# Patient Record
Sex: Male | Born: 1945
Health system: Southern US, Community
[De-identification: ages and names within clinical notes are randomized; demographics above are authoritative.]

## PROBLEM LIST (undated history)

## (undated) DIAGNOSIS — D649 Anemia, unspecified: Secondary | ICD-10-CM

## (undated) DIAGNOSIS — Z87898 Personal history of other specified conditions: Secondary | ICD-10-CM

## (undated) DIAGNOSIS — H9190 Unspecified hearing loss, unspecified ear: Secondary | ICD-10-CM

## (undated) DIAGNOSIS — I499 Cardiac arrhythmia, unspecified: Secondary | ICD-10-CM

## (undated) DIAGNOSIS — E039 Hypothyroidism, unspecified: Secondary | ICD-10-CM

## (undated) DIAGNOSIS — Z87438 Personal history of other diseases of male genital organs: Secondary | ICD-10-CM

## (undated) DIAGNOSIS — R42 Dizziness and giddiness: Secondary | ICD-10-CM

## (undated) DIAGNOSIS — R001 Bradycardia, unspecified: Secondary | ICD-10-CM

## (undated) DIAGNOSIS — C61 Malignant neoplasm of prostate: Secondary | ICD-10-CM

## (undated) DIAGNOSIS — E7800 Pure hypercholesterolemia, unspecified: Secondary | ICD-10-CM

## (undated) DIAGNOSIS — R569 Unspecified convulsions: Secondary | ICD-10-CM

## (undated) DIAGNOSIS — R1310 Dysphagia, unspecified: Secondary | ICD-10-CM

## (undated) DIAGNOSIS — I2510 Atherosclerotic heart disease of native coronary artery without angina pectoris: Secondary | ICD-10-CM

## (undated) DIAGNOSIS — I219 Acute myocardial infarction, unspecified: Secondary | ICD-10-CM

## (undated) DIAGNOSIS — C801 Malignant (primary) neoplasm, unspecified: Secondary | ICD-10-CM

## (undated) DIAGNOSIS — Z974 Presence of external hearing-aid: Secondary | ICD-10-CM

## (undated) DIAGNOSIS — M436 Torticollis: Secondary | ICD-10-CM

## (undated) DIAGNOSIS — I10 Essential (primary) hypertension: Secondary | ICD-10-CM

## (undated) HISTORY — PX: COLONOSCOPY: SHX174

## (undated) HISTORY — PX: CARDIAC CATHETERIZATION: SHX172

## (undated) HISTORY — PX: FRACTURE SURGERY: SHX138

## (undated) HISTORY — PX: THROAT SURGERY: SHX803

## (undated) HISTORY — PX: TONSILLECTOMY: SUR1361

---

## 1994-01-24 HISTORY — PX: CORONARY ARTERY BYPASS GRAFT: SHX141

## 2003-01-25 DIAGNOSIS — C801 Malignant (primary) neoplasm, unspecified: Secondary | ICD-10-CM

## 2003-01-25 HISTORY — DX: Malignant (primary) neoplasm, unspecified: C80.1

## 2004-08-26 ENCOUNTER — Emergency Department: Payer: Self-pay | Admitting: Unknown Physician Specialty

## 2005-06-14 ENCOUNTER — Ambulatory Visit: Payer: Self-pay | Admitting: Unknown Physician Specialty

## 2005-08-25 ENCOUNTER — Encounter: Payer: Self-pay | Admitting: Unknown Physician Specialty

## 2005-09-24 ENCOUNTER — Encounter: Payer: Self-pay | Admitting: Unknown Physician Specialty

## 2007-01-09 ENCOUNTER — Ambulatory Visit: Payer: Self-pay | Admitting: Gastroenterology

## 2007-02-13 ENCOUNTER — Other Ambulatory Visit: Payer: Self-pay

## 2007-02-13 ENCOUNTER — Emergency Department: Payer: Self-pay | Admitting: Emergency Medicine

## 2011-02-09 ENCOUNTER — Ambulatory Visit: Payer: Self-pay | Admitting: Cardiology

## 2011-05-04 ENCOUNTER — Ambulatory Visit: Payer: Self-pay | Admitting: Otolaryngology

## 2012-01-09 DIAGNOSIS — E039 Hypothyroidism, unspecified: Secondary | ICD-10-CM | POA: Insufficient documentation

## 2012-01-12 ENCOUNTER — Ambulatory Visit: Payer: Self-pay | Admitting: Unknown Physician Specialty

## 2012-01-13 LAB — PATHOLOGY REPORT

## 2012-10-29 ENCOUNTER — Ambulatory Visit: Payer: Self-pay | Admitting: Physician Assistant

## 2012-11-14 DIAGNOSIS — R351 Nocturia: Secondary | ICD-10-CM | POA: Insufficient documentation

## 2012-11-14 DIAGNOSIS — Z9221 Personal history of antineoplastic chemotherapy: Secondary | ICD-10-CM | POA: Insufficient documentation

## 2012-11-14 DIAGNOSIS — E782 Mixed hyperlipidemia: Secondary | ICD-10-CM | POA: Insufficient documentation

## 2012-11-14 DIAGNOSIS — R350 Frequency of micturition: Secondary | ICD-10-CM | POA: Insufficient documentation

## 2012-11-14 DIAGNOSIS — C140 Malignant neoplasm of pharynx, unspecified: Secondary | ICD-10-CM | POA: Insufficient documentation

## 2012-11-14 DIAGNOSIS — E291 Testicular hypofunction: Secondary | ICD-10-CM | POA: Insufficient documentation

## 2013-01-24 HISTORY — PX: EYE SURGERY: SHX253

## 2013-08-05 DIAGNOSIS — I10 Essential (primary) hypertension: Secondary | ICD-10-CM | POA: Insufficient documentation

## 2013-08-05 DIAGNOSIS — I2510 Atherosclerotic heart disease of native coronary artery without angina pectoris: Secondary | ICD-10-CM | POA: Insufficient documentation

## 2013-08-05 DIAGNOSIS — H9190 Unspecified hearing loss, unspecified ear: Secondary | ICD-10-CM | POA: Insufficient documentation

## 2013-08-05 DIAGNOSIS — I498 Other specified cardiac arrhythmias: Secondary | ICD-10-CM | POA: Insufficient documentation

## 2014-07-22 ENCOUNTER — Encounter: Payer: Self-pay | Admitting: *Deleted

## 2014-07-29 NOTE — Discharge Instructions (Signed)

## 2014-07-30 ENCOUNTER — Ambulatory Visit
Admission: RE | Admit: 2014-07-30 | Discharge: 2014-07-30 | Disposition: A | Discharge status: Home / Self Care | Payer: Medicare Other | Source: Ambulatory Visit | Attending: Ophthalmology | Admitting: Ophthalmology

## 2014-07-30 ENCOUNTER — Ambulatory Visit: Payer: Medicare Other | Admitting: Anesthesiology

## 2014-07-30 ENCOUNTER — Encounter: Payer: Self-pay | Admitting: *Deleted

## 2014-07-30 ENCOUNTER — Encounter
Admission: RE | Disposition: A | Discharge status: Home / Self Care | Payer: Self-pay | Source: Ambulatory Visit | Attending: Ophthalmology

## 2014-07-30 ENCOUNTER — Ambulatory Visit: Admit: 2014-07-30 | Payer: Self-pay | Admitting: Ophthalmology

## 2014-07-30 DIAGNOSIS — Z951 Presence of aortocoronary bypass graft: Secondary | ICD-10-CM | POA: Diagnosis not present

## 2014-07-30 DIAGNOSIS — H2512 Age-related nuclear cataract, left eye: Secondary | ICD-10-CM | POA: Insufficient documentation

## 2014-07-30 DIAGNOSIS — H9190 Unspecified hearing loss, unspecified ear: Secondary | ICD-10-CM | POA: Diagnosis not present

## 2014-07-30 DIAGNOSIS — Z79899 Other long term (current) drug therapy: Secondary | ICD-10-CM | POA: Insufficient documentation

## 2014-07-30 DIAGNOSIS — E780 Pure hypercholesterolemia: Secondary | ICD-10-CM | POA: Diagnosis not present

## 2014-07-30 DIAGNOSIS — I10 Essential (primary) hypertension: Secondary | ICD-10-CM | POA: Insufficient documentation

## 2014-07-30 DIAGNOSIS — Z85819 Personal history of malignant neoplasm of unspecified site of lip, oral cavity, and pharynx: Secondary | ICD-10-CM | POA: Insufficient documentation

## 2014-07-30 DIAGNOSIS — R011 Cardiac murmur, unspecified: Secondary | ICD-10-CM | POA: Diagnosis not present

## 2014-07-30 DIAGNOSIS — I252 Old myocardial infarction: Secondary | ICD-10-CM | POA: Diagnosis not present

## 2014-07-30 HISTORY — DX: Presence of external hearing-aid: Z97.4

## 2014-07-30 HISTORY — DX: Personal history of other specified conditions: Z87.898

## 2014-07-30 HISTORY — DX: Hypothyroidism, unspecified: E03.9

## 2014-07-30 HISTORY — DX: Dizziness and giddiness: R42

## 2014-07-30 HISTORY — PX: CATARACT EXTRACTION W/PHACO: SHX586

## 2014-07-30 HISTORY — DX: Torticollis: M43.6

## 2014-07-30 HISTORY — DX: Essential (primary) hypertension: I10

## 2014-07-30 HISTORY — DX: Pure hypercholesterolemia, unspecified: E78.00

## 2014-07-30 HISTORY — DX: Acute myocardial infarction, unspecified: I21.9

## 2014-07-30 HISTORY — DX: Malignant (primary) neoplasm, unspecified: C80.1

## 2014-07-30 HISTORY — DX: Unspecified hearing loss, unspecified ear: H91.90

## 2014-07-30 SURGERY — PHACOEMULSIFICATION, CATARACT, WITH IOL INSERTION
Anesthesia: Monitor Anesthesia Care | Laterality: Left | Wound class: Clean

## 2014-07-30 SURGERY — PHACOEMULSIFICATION, CATARACT, WITH IOL INSERTION
Anesthesia: Choice | Laterality: Left

## 2014-07-30 MED ORDER — ARMC OPHTHALMIC DILATING GEL
1.0000 "application " | OPHTHALMIC | Status: DC | PRN
  Administered 2014-07-30 (×2): 1 via OPHTHALMIC

## 2014-07-30 MED ORDER — LIDOCAINE HCL (PF) 4 % IJ SOLN
INTRAOCULAR | Status: DC | PRN
  Administered 2014-07-30: 11:00:00 via OPHTHALMIC

## 2014-07-30 MED ORDER — BRIMONIDINE TARTRATE 0.2 % OP SOLN
OPHTHALMIC | Status: DC | PRN
  Administered 2014-07-30: 1 [drp] via OPHTHALMIC

## 2014-07-30 MED ORDER — NA HYALUR & NA CHOND-NA HYALUR 0.4-0.35 ML IO KIT
PACK | INTRAOCULAR | Status: DC | PRN
  Administered 2014-07-30: 1 mL via INTRAOCULAR

## 2014-07-30 MED ORDER — MIDAZOLAM HCL 2 MG/2ML IJ SOLN
INTRAMUSCULAR | Status: DC | PRN
  Administered 2014-07-30: 2 mg via INTRAVENOUS

## 2014-07-30 MED ORDER — TETRACAINE HCL 0.5 % OP SOLN
1.0000 [drp] | OPHTHALMIC | Status: DC | PRN
  Administered 2014-07-30: 1 [drp] via OPHTHALMIC

## 2014-07-30 MED ORDER — TIMOLOL MALEATE 0.5 % OP SOLN
OPHTHALMIC | Status: DC | PRN
Start: 2014-07-30 — End: 2014-07-30
  Administered 2014-07-30: 1 [drp] via OPHTHALMIC

## 2014-07-30 MED ORDER — EPINEPHRINE HCL 1 MG/ML IJ SOLN
INTRAOCULAR | Status: DC | PRN
  Administered 2014-07-30: 78 mL via OPHTHALMIC

## 2014-07-30 MED ORDER — POVIDONE-IODINE 5 % OP SOLN
1.0000 "application " | OPHTHALMIC | Status: DC | PRN
  Administered 2014-07-30: 1 via OPHTHALMIC

## 2014-07-30 MED ORDER — FENTANYL CITRATE (PF) 100 MCG/2ML IJ SOLN
INTRAMUSCULAR | Status: DC | PRN
  Administered 2014-07-30: 100 ug via INTRAVENOUS

## 2014-07-30 MED ORDER — CEFUROXIME OPHTHALMIC INJECTION 1 MG/0.1 ML
INJECTION | OPHTHALMIC | Status: DC | PRN
  Administered 2014-07-30: 0.1 mL via INTRACAMERAL

## 2014-07-30 SURGICAL SUPPLY — 26 items
CANNULA ANT/CHMB 27GA (MISCELLANEOUS) ×3 IMPLANT
GLOVE SURG LX 7.5 STRW (GLOVE) ×2
GLOVE SURG LX STRL 7.5 STRW (GLOVE) ×1 IMPLANT
GLOVE SURG TRIUMPH 8.0 PF LTX (GLOVE) ×3 IMPLANT
GOWN STRL REUS W/ TWL LRG LVL3 (GOWN DISPOSABLE) ×2 IMPLANT
GOWN STRL REUS W/TWL LRG LVL3 (GOWN DISPOSABLE) ×4
LENS IOL ACRSF IQ PC 23.5 (Intraocular Lens) ×1 IMPLANT
LENS IOL ACRYSOF IQ POST 23.5 (Intraocular Lens) ×3 IMPLANT
MARKER SKIN SURG W/RULER VIO (MISCELLANEOUS) ×3 IMPLANT
NDL RETROBULBAR .5 NSTRL (NEEDLE) IMPLANT
NEEDLE FILTER BLUNT 18X 1/2SAF (NEEDLE) ×2
NEEDLE FILTER BLUNT 18X1 1/2 (NEEDLE) ×1 IMPLANT
PACK CATARACT BRASINGTON (MISCELLANEOUS) ×3 IMPLANT
PACK EYE AFTER SURG (MISCELLANEOUS) ×3 IMPLANT
PACK OPTHALMIC (MISCELLANEOUS) ×3 IMPLANT
RING MALYGIN 7.0 (MISCELLANEOUS) IMPLANT
SUT ETHILON 10-0 CS-B-6CS-B-6 (SUTURE)
SUT VICRYL  9 0 (SUTURE)
SUT VICRYL 9 0 (SUTURE) IMPLANT
SUTURE EHLN 10-0 CS-B-6CS-B-6 (SUTURE) IMPLANT
SYR 3ML LL SCALE MARK (SYRINGE) ×3 IMPLANT
SYR 5ML LL (SYRINGE) IMPLANT
SYR TB 1ML LUER SLIP (SYRINGE) ×3 IMPLANT
WATER STERILE IRR 250ML POUR (IV SOLUTION) ×3 IMPLANT
WATER STERILE IRR 500ML POUR (IV SOLUTION) IMPLANT
WIPE NON LINTING 3.25X3.25 (MISCELLANEOUS) ×3 IMPLANT

## 2014-07-30 NOTE — H&P (Signed)
  The History and Physical notes were scanned in.  The patient remains stable and unchanged from the H&P.   Previous H&P reviewed, patient examined, and there are no changes.  Jermaine Campbell 07/30/2014 10:59 AM

## 2014-07-30 NOTE — Anesthesia Postprocedure Evaluation (Signed)
  Anesthesia Post-op Note  Patient: Jermaine Campbell  Procedure(s) Performed: Procedure(s) with comments: CATARACT EXTRACTION PHACO AND INTRAOCULAR LENS PLACEMENT ([place]) SHUGAR[place]INE (Left) - SHUGARCAINE  Anesthesia type:MAC  Patient location: PACU  Post pain: Pain level controlled  Post assessment: Post-op Vital signs reviewed, Patient's Cardiovascular Status Stable, Respiratory Function Stable, Patent Airway and No signs of Nausea or vomiting  Post vital signs: Reviewed and stable  Last Vitals:  Filed Vitals:   07/30/14 1142  BP: 159/79  Pulse: 52  Temp: 36.6 C  Resp:     Level of consciousness: awake, alert  and patient cooperative  Complications: No apparent anesthesia complications

## 2014-07-30 NOTE — Transfer of Care (Signed)
Immediate Anesthesia Transfer of Care Note  Patient: Jermaine Campbell  Procedure(s) Performed: Procedure(s) with comments: CATARACT EXTRACTION PHACO AND INTRAOCULAR LENS PLACEMENT ([place]) [place] (Left) - SHUGARCAINE  Patient Location: PACU  Anesthesia Type: MAC  Level of Consciousness: awake, alert  and patient cooperative  Airway and Oxygen Therapy: Patient Spontanous Breathing and Patient connected to supplemental oxygen  Post-op Assessment: Post-op Vital signs reviewed, Patient's Cardiovascular Status Stable, Respiratory Function Stable, Patent Airway and No signs of Nausea or vomiting  Post-op Vital Signs: Reviewed and stable  Complications: No apparent anesthesia complications

## 2014-07-30 NOTE — Op Note (Signed)
LOCATION:  [place]  PREOPERATIVE DIAGNOSIS:  Nuclear sclerotic cataract left eye.  H25.12  POSTOPERATIVE DIAGNOSIS:  Nuclear sclerotic cataract left eye.   PROCEDURE:  Phacoemulsification with posterior chamber intraocular lens implantation of the left eye.   LENS:  Implant Name Type Inv. Item Serial No. Manufacturer Lot No. LRB No. Used  IMPLANT LENS - [id number] Intraocular Lens IMPLANT LENS   ALCON   Left 1  [id number] 20.0 D PCIOL   ULTRASOUND TIME:  18%  of 1 minutes 30 seconds, CDE 16.4 SURGEON:  Wyonia Hough, MD   ANESTHESIA:  Retrobulbar block of Xylocaine and Bupivacaine.   COMPLICATIONS:  None.   DESCRIPTION OF PROCEDURE:  The patient was identified in the holding room and transported to the operating room and placed in the supine position under the operating microscope.  The left eye was identified as the operative eye and a retrobulbar block was performed under intravenous sedation.  It was then prepped and draped in the usual sterile ophthalmic fashion.   A 1 millimeter clear-corneal paracentesis was made at the 1:30 position.  The anterior chamber was filled with Viscoat viscoelastic.  A 2.4 millimeter keratome was used to make a near-clear corneal incision at the 10:30 position.  A curvilinear capsulorrhexis was made with a cystotome and capsulorrhexis forceps.  Balanced salt solution was used to hydrodissect and hydrodelineate the nucleus.   Phacoemulsification was then used in stop and chop fashion to remove the lens nucleus and epinucleus.  The remaining cortex was then removed using the irrigation and aspiration handpiece.  Provisc was then placed into the capsular bag to distend it for lens placement.  A 20.0 -diopter lens was then injected into the capsular bag.  The remaining viscoelastic was aspirated.   Wounds were hydrated with balanced salt solution.  The anterior chamber was inflated to a physiologic pressure with balanced salt solution.  No  wound leaks were noted. Cefuroxime 0.1 ml of a 10mg /ml solution was injected into the anterior chamber for a dose of 1 mg of intracameral antibiotic at the completion of the case.   Timolol and Brimonidine drops were placed followed by erythromycin ointment.  The eye was patched.  The patient was taken to the recovery room in stable condition without complications of anesthesia or surgery.   Jermaine Campbell 07/30/2014, 11:40 AM

## 2014-07-30 NOTE — Anesthesia Procedure Notes (Signed)
Procedure Name: MAC Performed by: Corbet Hanley Pre-anesthesia Checklist: Patient identified, Emergency Drugs available, Suction available, Timeout performed and Patient being monitored Patient Re-evaluated:Patient Re-evaluated prior to inductionOxygen Delivery Method: Nasal cannula Placement Confirmation: positive ETCO2     

## 2014-07-30 NOTE — Anesthesia Preprocedure Evaluation (Signed)
Anesthesia Evaluation  Patient identified by MRN, date of birth, ID band  Reviewed: Allergy & Precautions, H&P , NPO status , Patient's Chart, lab work & pertinent test results  Airway Mallampati: II  TM Distance: >3 FB Neck ROM: limited    Dental no notable dental hx.    Pulmonary    Pulmonary exam normal       Cardiovascular hypertension, + Past MI Rhythm:regular Rate:Normal     Neuro/Psych    GI/Hepatic   Endo/Other  Hypothyroidism   Renal/GU      Musculoskeletal   Abdominal   Peds  Hematology   Anesthesia Other Findings H/o throat cancer, s/p radiation.  Neck is tight.  Probable difficult intubation.  Reproductive/Obstetrics                             Anesthesia Physical Anesthesia Plan  ASA: II  Anesthesia Plan: MAC   Post-op Pain Management:    Induction:   Airway Management Planned:   Additional Equipment:   Intra-op Plan:   Post-operative Plan:   Informed Consent: I have reviewed the patients History and Physical, chart, labs and discussed the procedure including the risks, benefits and alternatives for the proposed anesthesia with the patient or authorized representative who has indicated his/her understanding and acceptance.     Plan Discussed with: CRNA  Anesthesia Plan Comments:         Anesthesia Quick Evaluation

## 2014-07-31 ENCOUNTER — Encounter: Payer: Self-pay | Admitting: Ophthalmology

## 2014-10-23 ENCOUNTER — Encounter: Payer: Self-pay | Admitting: Anesthesiology

## 2014-10-29 ENCOUNTER — Ambulatory Visit
Admission: RE | Admit: 2014-10-29 | Discharge: 2014-10-29 | Disposition: A | Discharge status: Home / Self Care | Payer: Medicare Other | Source: Ambulatory Visit | Attending: Ophthalmology | Admitting: Ophthalmology

## 2014-10-29 ENCOUNTER — Encounter
Admission: RE | Disposition: A | Discharge status: Home / Self Care | Payer: Self-pay | Source: Ambulatory Visit | Attending: Ophthalmology

## 2014-10-29 ENCOUNTER — Ambulatory Visit: Payer: Medicare Other | Admitting: Anesthesiology

## 2014-10-29 DIAGNOSIS — H9190 Unspecified hearing loss, unspecified ear: Secondary | ICD-10-CM | POA: Diagnosis not present

## 2014-10-29 DIAGNOSIS — Z951 Presence of aortocoronary bypass graft: Secondary | ICD-10-CM | POA: Insufficient documentation

## 2014-10-29 DIAGNOSIS — E7800 Pure hypercholesterolemia, unspecified: Secondary | ICD-10-CM | POA: Diagnosis not present

## 2014-10-29 DIAGNOSIS — Z85819 Personal history of malignant neoplasm of unspecified site of lip, oral cavity, and pharynx: Secondary | ICD-10-CM | POA: Diagnosis not present

## 2014-10-29 DIAGNOSIS — H2511 Age-related nuclear cataract, right eye: Secondary | ICD-10-CM | POA: Insufficient documentation

## 2014-10-29 DIAGNOSIS — I252 Old myocardial infarction: Secondary | ICD-10-CM | POA: Diagnosis not present

## 2014-10-29 DIAGNOSIS — Z7982 Long term (current) use of aspirin: Secondary | ICD-10-CM | POA: Diagnosis not present

## 2014-10-29 DIAGNOSIS — I10 Essential (primary) hypertension: Secondary | ICD-10-CM | POA: Diagnosis not present

## 2014-10-29 DIAGNOSIS — Z9842 Cataract extraction status, left eye: Secondary | ICD-10-CM | POA: Insufficient documentation

## 2014-10-29 DIAGNOSIS — Z79899 Other long term (current) drug therapy: Secondary | ICD-10-CM | POA: Diagnosis not present

## 2014-10-29 DIAGNOSIS — R011 Cardiac murmur, unspecified: Secondary | ICD-10-CM | POA: Diagnosis not present

## 2014-10-29 HISTORY — PX: CATARACT EXTRACTION W/PHACO: SHX586

## 2014-10-29 SURGERY — PHACOEMULSIFICATION, CATARACT, WITH IOL INSERTION
Anesthesia: Monitor Anesthesia Care | Laterality: Right | Wound class: Clean

## 2014-10-29 MED ORDER — NA HYALUR & NA CHOND-NA HYALUR 0.4-0.35 ML IO KIT
PACK | INTRAOCULAR | Status: DC | PRN
  Administered 2014-10-29: 1 mL via INTRAOCULAR

## 2014-10-29 MED ORDER — CEFUROXIME OPHTHALMIC INJECTION 1 MG/0.1 ML
INJECTION | OPHTHALMIC | Status: DC | PRN
  Administered 2014-10-29: 0.1 mL via INTRACAMERAL

## 2014-10-29 MED ORDER — FENTANYL CITRATE (PF) 100 MCG/2ML IJ SOLN
INTRAMUSCULAR | Status: DC | PRN
  Administered 2014-10-29: 50 ug via INTRAVENOUS

## 2014-10-29 MED ORDER — BRIMONIDINE TARTRATE 0.2 % OP SOLN
OPHTHALMIC | Status: DC | PRN
  Administered 2014-10-29: 1 [drp] via OPHTHALMIC

## 2014-10-29 MED ORDER — BALANCED SALT IO SOLN
INTRAOCULAR | Status: DC | PRN
  Administered 2014-10-29: 1 mL via OPHTHALMIC

## 2014-10-29 MED ORDER — MIDAZOLAM HCL 2 MG/2ML IJ SOLN
INTRAMUSCULAR | Status: DC | PRN
  Administered 2014-10-29: 2 mg via INTRAVENOUS

## 2014-10-29 MED ORDER — EPINEPHRINE HCL 1 MG/ML IJ SOLN
INTRAOCULAR | Status: DC | PRN
  Administered 2014-10-29: 80 mL via OPHTHALMIC

## 2014-10-29 MED ORDER — ARMC OPHTHALMIC DILATING GEL
1.0000 "application " | OPHTHALMIC | Status: DC | PRN
  Administered 2014-10-29 (×2): 1 via OPHTHALMIC

## 2014-10-29 MED ORDER — POVIDONE-IODINE 5 % OP SOLN
1.0000 "application " | OPHTHALMIC | Status: DC | PRN
  Administered 2014-10-29: 1 via OPHTHALMIC

## 2014-10-29 MED ORDER — TIMOLOL MALEATE 0.5 % OP SOLN
OPHTHALMIC | Status: DC | PRN
  Administered 2014-10-29: 1 [drp] via OPHTHALMIC

## 2014-10-29 MED ORDER — TETRACAINE HCL 0.5 % OP SOLN
1.0000 [drp] | OPHTHALMIC | Status: DC | PRN
  Administered 2014-10-29: 1 [drp] via OPHTHALMIC

## 2014-10-29 MED ORDER — LACTATED RINGERS IV SOLN: INTRAVENOUS | Status: DC

## 2014-10-29 SURGICAL SUPPLY — 26 items
CANNULA ANT/CHMB 27GA (MISCELLANEOUS) ×3 IMPLANT
GLOVE SURG LX 7.5 STRW (GLOVE) ×2
GLOVE SURG LX STRL 7.5 STRW (GLOVE) ×1 IMPLANT
GLOVE SURG TRIUMPH 8.0 PF LTX (GLOVE) ×3 IMPLANT
GOWN STRL REUS W/ TWL LRG LVL3 (GOWN DISPOSABLE) ×2 IMPLANT
GOWN STRL REUS W/TWL LRG LVL3 (GOWN DISPOSABLE) ×4
LENS IOL ACRSF IQ PC 20.5 (Intraocular Lens) ×1 IMPLANT
LENS IOL ACRYSOF IQ POST 20.5 (Intraocular Lens) ×3 IMPLANT
MARKER SKIN SURG W/RULER VIO (MISCELLANEOUS) ×3 IMPLANT
NDL RETROBULBAR .5 NSTRL (NEEDLE) IMPLANT
NEEDLE FILTER BLUNT 18X 1/2SAF (NEEDLE) ×4
NEEDLE FILTER BLUNT 18X1 1/2 (NEEDLE) ×2 IMPLANT
PACK CATARACT BRASINGTON (MISCELLANEOUS) ×3 IMPLANT
PACK EYE AFTER SURG (MISCELLANEOUS) ×3 IMPLANT
PACK OPTHALMIC (MISCELLANEOUS) ×3 IMPLANT
RING MALYGIN 7.0 (MISCELLANEOUS) IMPLANT
SUT ETHILON 10-0 CS-B-6CS-B-6 (SUTURE)
SUT VICRYL  9 0 (SUTURE)
SUT VICRYL 9 0 (SUTURE) IMPLANT
SUTURE EHLN 10-0 CS-B-6CS-B-6 (SUTURE) IMPLANT
SYR 3ML LL SCALE MARK (SYRINGE) ×6 IMPLANT
SYR 5ML LL (SYRINGE) IMPLANT
SYR TB 1ML LUER SLIP (SYRINGE) ×3 IMPLANT
WATER STERILE IRR 250ML POUR (IV SOLUTION) ×3 IMPLANT
WATER STERILE IRR 500ML POUR (IV SOLUTION) IMPLANT
WIPE NON LINTING 3.25X3.25 (MISCELLANEOUS) ×3 IMPLANT

## 2014-10-29 NOTE — Anesthesia Procedure Notes (Signed)
Procedure Name: MAC Performed by: Erie Radu Pre-anesthesia Checklist: Patient identified, Emergency Drugs available, Suction available, Timeout performed and Patient being monitored Patient Re-evaluated:Patient Re-evaluated prior to inductionOxygen Delivery Method: Nasal cannula Placement Confirmation: positive ETCO2     

## 2014-10-29 NOTE — Op Note (Signed)
LOCATION:  [place]   PREOPERATIVE DIAGNOSIS:    Nuclear sclerotic cataract right eye. H25.11   POSTOPERATIVE DIAGNOSIS:  Nuclear sclerotic cataract right eye.     PROCEDURE:  Phacoemusification with posterior chamber intraocular lens placement of the right eye   LENS:   Implant Name Type Inv. Item Serial No. Manufacturer Lot No. LRB No. Used  IMPLANT LENS - [id number] Intraocular Lens IMPLANT LENS [id number] ALCON   Right 1     SN60WF 20.5 D   ULTRASOUND TIME: 10 % of 1 minutes, 54 seconds.  CDE 11.9   SURGEON:  Wyonia Hough, MD   ANESTHESIA:  Topical with tetracaine drops and 2% Xylocaine jelly, augmented with 1% preservative-free intracameral lidocaine.    COMPLICATIONS:  None.   DESCRIPTION OF PROCEDURE:  The patient was identified in the holding room and transported to the operating room and placed in the supine position under the operating microscope.  The right eye was identified as the operative eye and it was prepped and draped in the usual sterile ophthalmic fashion.   A 1 millimeter clear-corneal paracentesis was made at the 12:00 position.  0.5 ml of preservative-free 1% lidocaine was injected into the anterior chamber. The anterior chamber was filled with Viscoat viscoelastic.  A 2.4 millimeter keratome was used to make a near-clear corneal incision at the 9:00 position.  A curvilinear capsulorrhexis was made with a cystotome and capsulorrhexis forceps.  Balanced salt solution was used to hydrodissect and hydrodelineate the nucleus.   Phacoemulsification was then used in stop and chop fashion to remove the lens nucleus and epinucleus.  The remaining cortex was then removed using the irrigation and aspiration handpiece. Provisc was then placed into the capsular bag to distend it for lens placement.  A lens was then injected into the capsular bag.  The remaining viscoelastic was aspirated.   Wounds were hydrated with balanced salt solution.  The  anterior chamber was inflated to a physiologic pressure with balanced salt solution.  No wound leaks were noted. Cefuroxime 0.1 ml of a 10mg /ml solution was injected into the anterior chamber for a dose of 1 mg of intracameral antibiotic at the completion of the case.   Timolol and Brimonidine drops were applied to the eye.  The patient was taken to the recovery room in stable condition without complications of anesthesia or surgery.   Jermaine Campbell 10/29/2014, 10:29 AM

## 2014-10-29 NOTE — Anesthesia Postprocedure Evaluation (Signed)
  Anesthesia Post-op Note  Patient: Jermaine Campbell  Procedure(s) Performed: Procedure(s) with comments: CATARACT EXTRACTION PHACO AND INTRAOCULAR LENS PLACEMENT ([place]) (Right) - SHUGARCAINE  Anesthesia type:MAC  Patient location: PACU  Post pain: Pain level controlled  Post assessment: Post-op Vital signs reviewed, Patient's Cardiovascular Status Stable, Respiratory Function Stable, Patent Airway and No signs of Nausea or vomiting  Post vital signs: Reviewed and stable  Last Vitals:  Filed Vitals:   10/29/14 1045  BP: 121/73  Pulse: 53  Temp:   Resp: 20    Level of consciousness: awake, alert  and patient cooperative  Complications: No apparent anesthesia complications

## 2014-10-29 NOTE — Transfer of Care (Signed)
Immediate Anesthesia Transfer of Care Note  Patient: Jermaine Campbell  Procedure(s) Performed: Procedure(s) with comments: CATARACT EXTRACTION PHACO AND INTRAOCULAR LENS PLACEMENT ([place]) (Right) - [place]  Patient Location: PACU  Anesthesia Type: MAC  Level of Consciousness: awake, alert  and patient cooperative  Airway and Oxygen Therapy: Patient Spontanous Breathing and Patient connected to supplemental oxygen  Post-op Assessment: Post-op Vital signs reviewed, Patient's Cardiovascular Status Stable, Respiratory Function Stable, Patent Airway and No signs of Nausea or vomiting  Post-op Vital Signs: Reviewed and stable  Complications: No apparent anesthesia complications

## 2014-10-29 NOTE — Discharge Instructions (Signed)
Cataract Surgery, Care After Refer to this sheet in the next few weeks. These instructions provide you with information on caring for yourself after your procedure. Your caregiver may also give you more specific instructions. Your treatment has been planned according to current medical practices, but problems sometimes occur. Call your caregiver if you have any problems or questions after your procedure.  HOME CARE INSTRUCTIONS   Avoid strenuous activities as directed by your caregiver.  Ask your caregiver when you can resume driving.  Use eyedrops or other medicines to help healing and control pressure inside your eye as directed by your caregiver.  Only take over-the-counter or prescription medicines for pain, discomfort, or fever as directed by your caregiver.  Do not to touch or rub your eyes.  You may be instructed to use a protective shield during the first few days and nights after surgery. If not, wear sunglasses to protect your eyes. This is to protect the eye from pressure or from being accidentally bumped.  Keep the area around your eye clean and dry. Avoid swimming or allowing water to hit you directly in the face while showering. Keep soap and shampoo out of your eyes.  Do not bend or lift heavy objects. Bending increases pressure in the eye. You can walk, climb stairs, and do light household chores.  Do not put a contact lens into the eye that had surgery until your caregiver says it is okay to do so.  Ask your doctor when you can return to work. This will depend on the kind of work that you do. If you work in a dusty environment, you may be advised to wear protective eyewear for a period of time.  Ask your caregiver when it will be safe to engage in sexual activity.  Continue with your regular eye exams as directed by your caregiver. What to expect:  It is normal to feel itching and mild discomfort for a few days after cataract surgery. Some fluid discharge is also common,  and your eye may be sensitive to light and touch.  After 1 to 2 days, even moderate discomfort should disappear. In most cases, healing will take about 6 weeks.  If you received an intraocular lens (IOL), you may notice that colors are very bright or have a blue tinge. Also, if you have been in bright sunlight, everything may appear reddish for a few hours. If you see these color tinges, it is because your lens is clear and no longer cloudy. Within a few months after receiving an IOL, these extra colors should go away. When you have healed, you will probably need new glasses. SEEK MEDICAL CARE IF:   You have increased bruising around your eye.  You have discomfort not helped by medicine. SEEK IMMEDIATE MEDICAL CARE IF:   You have a fever.  You have a worsening or sudden vision loss.  You have redness, swelling, or increasing pain in the eye.  You have a thick discharge from the eye that had surgery. MAKE SURE YOU:  Understand these instructions.  Will watch your condition.  Will get help right away if you are not doing well or get worse.   This information is not intended to replace advice given to you by your health care provider. Make sure you discuss any questions you have with your health care provider.   Document Released: 07/30/2004 Document Revised: 01/31/2014 Document Reviewed: 09/03/2010 Elsevier Interactive Patient Education 2016 [place] Anesthesia, Care After Refer to  this sheet in the next few weeks. These instructions provide you with information on caring for yourself after your procedure. Your health care provider may also give you more specific instructions. Your treatment has been planned according to current medical practices, but problems sometimes occur. Call your health care provider if you have any problems or questions after your procedure. WHAT TO EXPECT AFTER THE PROCEDURE After the procedure, it is typical to  experience:  Sleepiness.  Nausea and vomiting. HOME CARE INSTRUCTIONS  For the first 24 hours after general anesthesia:  Have a responsible person with you.  Do not drive a car. If you are alone, do not take public transportation.  Do not drink alcohol.  Do not take medicine that has not been prescribed by your health care provider.  Do not sign important papers or make important decisions.  You may resume a normal diet and activities as directed by your health care provider.  Change bandages (dressings) as directed.  If you have questions or problems that seem related to general anesthesia, call the hospital and ask for the anesthetist or anesthesiologist on call. SEEK MEDICAL CARE IF:  You have nausea and vomiting that continue the day after anesthesia.  You develop a rash. SEEK IMMEDIATE MEDICAL CARE IF:   You have difficulty breathing.  You have chest pain.  You have any allergic problems. Document Released: 04/18/2000 Document Revised: 01/15/2013 Document Reviewed: 07/26/2012 [facility] Patient Information 2015 [place], [place]. This information is not intended to replace advice given to you by your health care provider. Make sure you discuss any questions you have with your health care provider.

## 2014-10-29 NOTE — H&P (Signed)
  The History and Physical notes were scanned in.  The patient remains stable and unchanged from the H&P.   Previous H&P reviewed, patient examined, and there are no changes.  Jermaine Campbell 10/29/2014 9:15 AM

## 2014-10-29 NOTE — Anesthesia Preprocedure Evaluation (Signed)
Anesthesia Evaluation  Patient identified by MRN, date of birth, ID band  Reviewed: Allergy & Precautions, H&P , NPO status , Patient's Chart, lab work & pertinent test results  Airway Mallampati: II  TM Distance: >3 FB Neck ROM: limited    Dental no notable dental hx.    Pulmonary    Pulmonary exam normal       Cardiovascular hypertension, + Past MI Rhythm:regular Rate:Normal     Neuro/Psych    GI/Hepatic   Endo/Other  Hypothyroidism   Renal/GU      Musculoskeletal   Abdominal   Peds  Hematology   Anesthesia Other Findings H/o throat cancer, s/p radiation.  Neck is tight.  Probable difficult intubation.  Reproductive/Obstetrics                             Anesthesia Physical Anesthesia Plan  ASA: II  Anesthesia Plan: MAC   Post-op Pain Management:    Induction:   Airway Management Planned:   Additional Equipment:   Intra-op Plan:   Post-operative Plan:   Informed Consent: I have reviewed the patients History and Physical, chart, labs and discussed the procedure including the risks, benefits and alternatives for the proposed anesthesia with the patient or authorized representative who has indicated his/her understanding and acceptance.     Plan Discussed with: CRNA  Anesthesia Plan Comments:         Anesthesia Quick Evaluation  

## 2014-10-30 ENCOUNTER — Encounter: Payer: Self-pay | Admitting: Ophthalmology

## 2015-12-04 DIAGNOSIS — M47812 Spondylosis without myelopathy or radiculopathy, cervical region: Secondary | ICD-10-CM | POA: Insufficient documentation

## 2015-12-04 DIAGNOSIS — M75121 Complete rotator cuff tear or rupture of right shoulder, not specified as traumatic: Secondary | ICD-10-CM | POA: Insufficient documentation

## 2015-12-04 DIAGNOSIS — M7581 Other shoulder lesions, right shoulder: Secondary | ICD-10-CM | POA: Insufficient documentation

## 2015-12-08 ENCOUNTER — Other Ambulatory Visit: Payer: Self-pay | Admitting: Surgery

## 2015-12-08 DIAGNOSIS — M25511 Pain in right shoulder: Secondary | ICD-10-CM

## 2015-12-21 ENCOUNTER — Ambulatory Visit
Admission: RE | Admit: 2015-12-21 | Discharge: 2015-12-21 | Disposition: A | Discharge status: Home / Self Care | Payer: Medicare Other | Source: Ambulatory Visit | Attending: Surgery | Admitting: Surgery

## 2015-12-21 DIAGNOSIS — M25511 Pain in right shoulder: Secondary | ICD-10-CM | POA: Diagnosis present

## 2015-12-21 DIAGNOSIS — M75101 Unspecified rotator cuff tear or rupture of right shoulder, not specified as traumatic: Secondary | ICD-10-CM | POA: Diagnosis not present

## 2016-01-11 ENCOUNTER — Encounter
Admission: RE | Admit: 2016-01-11 | Discharge: 2016-01-11 | Disposition: A | Discharge status: Home / Self Care | Payer: Medicare Other | Source: Ambulatory Visit | Attending: Surgery | Admitting: Surgery

## 2016-01-11 DIAGNOSIS — Z01812 Encounter for preprocedural laboratory examination: Secondary | ICD-10-CM | POA: Diagnosis not present

## 2016-01-11 DIAGNOSIS — M75121 Complete rotator cuff tear or rupture of right shoulder, not specified as traumatic: Secondary | ICD-10-CM | POA: Insufficient documentation

## 2016-01-11 DIAGNOSIS — Z01818 Encounter for other preprocedural examination: Secondary | ICD-10-CM | POA: Insufficient documentation

## 2016-01-11 HISTORY — DX: Unspecified convulsions: R56.9

## 2016-01-11 HISTORY — DX: Anemia, unspecified: D64.9

## 2016-01-11 LAB — CBC
HEMATOCRIT: 37 % — AB (ref 40.0–52.0)
HEMOGLOBIN: 12.3 g/dL — AB (ref 13.0–18.0)
MCH: 28.6 pg (ref 26.0–34.0)
MCHC: 33.3 g/dL (ref 32.0–36.0)
MCV: 85.8 fL (ref 80.0–100.0)
Platelets: 151 10*3/uL (ref 150–440)
RBC: 4.31 MIL/uL — ABNORMAL LOW (ref 4.40–5.90)
RDW: 16.1 % — AB (ref 11.5–14.5)
WBC: 4.5 10*3/uL (ref 3.8–10.6)

## 2016-01-11 NOTE — Patient Instructions (Signed)
Your procedure is scheduled on: Tuesday 01/26/15 Report to [facility]. 2ND FLOOR MEDICAL MALL ENTRANCE. To find out your arrival time please call [phone] between 1PM - 3PM on Friday 01/22/16.  Remember: Instructions that are not followed completely may result in serious medical risk, up to and including death, or upon the discretion of your surgeon and anesthesiologist your surgery may need to be rescheduled.    __X__ 1. Do not eat food or drink liquids after midnight. No gum chewing or hard candies.     __X__ 2. No Alcohol for 24 hours before or after surgery.   ____ 3. Bring all medications with you on the day of surgery if instructed.    __X__ 4. Notify your doctor if there is any change in your medical condition     (cold, fever, infections).             ___X__5. No smoking within 24 hours of your surgery.     Do not wear jewelry, make-up, hairpins, clips or nail polish.  Do not wear lotions, powders, or perfumes.   Do not shave 48 hours prior to surgery. Men may shave face and neck.  Do not bring valuables to the hospital.    [facility] is not responsible for any belongings or valuables.               Contacts, dentures or bridgework may not be worn into surgery.  Leave your suitcase in the car. After surgery it may be brought to your room.  For patients admitted to the hospital, discharge time is determined by your                treatment team.   Patients discharged the day of surgery will not be allowed to drive home.   Please read over the following fact sheets that you were given:   Pain Booklet and MRSA Information   ____ Take these medicines the morning of surgery with A SIP OF WATER:    1. NONE  2.   3.   4.  5.  6.  ____ Fleet Enema (as directed)   __X__ Use CHG Soap as directed  ____ Use inhalers on the day of surgery  ____ Stop metformin 2 days prior to surgery    ____ Take 1/2 of usual insulin dose the night before surgery and none on the  morning of surgery.   __X__ Stop Coumadin/Plavix/aspirin on 01/16/16  __X__ Stop Anti-inflammatories such as Advil, Aleve, Ibuprofen, Motrin, Naproxen, Naprosyn, Goodies,powder, or aspirin products.  OK to take Tylenol.   __X__ Stop supplements until after surgery. (B12, WHEN YOU STOP ASPIRIN)   ____ Bring C-Pap to the hospital.

## 2016-01-26 ENCOUNTER — Ambulatory Visit
Admission: RE | Admit: 2016-01-26 | Discharge: 2016-01-26 | Disposition: A | Discharge status: Home / Self Care | Payer: Medicare Other | Source: Ambulatory Visit | Attending: Surgery | Admitting: Surgery

## 2016-01-26 ENCOUNTER — Ambulatory Visit: Payer: Medicare Other | Admitting: Anesthesiology

## 2016-01-26 ENCOUNTER — Encounter
Admission: RE | Disposition: A | Discharge status: Home / Self Care | Payer: Self-pay | Source: Ambulatory Visit | Attending: Surgery

## 2016-01-26 ENCOUNTER — Encounter: Payer: Self-pay | Admitting: *Deleted

## 2016-01-26 DIAGNOSIS — M75111 Incomplete rotator cuff tear or rupture of right shoulder, not specified as traumatic: Secondary | ICD-10-CM | POA: Diagnosis not present

## 2016-01-26 DIAGNOSIS — M7521 Bicipital tendinitis, right shoulder: Secondary | ICD-10-CM | POA: Insufficient documentation

## 2016-01-26 DIAGNOSIS — Z9221 Personal history of antineoplastic chemotherapy: Secondary | ICD-10-CM | POA: Diagnosis not present

## 2016-01-26 DIAGNOSIS — D649 Anemia, unspecified: Secondary | ICD-10-CM | POA: Insufficient documentation

## 2016-01-26 DIAGNOSIS — Z7982 Long term (current) use of aspirin: Secondary | ICD-10-CM | POA: Diagnosis not present

## 2016-01-26 DIAGNOSIS — Z923 Personal history of irradiation: Secondary | ICD-10-CM | POA: Insufficient documentation

## 2016-01-26 DIAGNOSIS — Z85819 Personal history of malignant neoplasm of unspecified site of lip, oral cavity, and pharynx: Secondary | ICD-10-CM | POA: Diagnosis not present

## 2016-01-26 DIAGNOSIS — I2510 Atherosclerotic heart disease of native coronary artery without angina pectoris: Secondary | ICD-10-CM | POA: Diagnosis not present

## 2016-01-26 DIAGNOSIS — I10 Essential (primary) hypertension: Secondary | ICD-10-CM | POA: Insufficient documentation

## 2016-01-26 DIAGNOSIS — I25810 Atherosclerosis of coronary artery bypass graft(s) without angina pectoris: Secondary | ICD-10-CM | POA: Insufficient documentation

## 2016-01-26 DIAGNOSIS — M75101 Unspecified rotator cuff tear or rupture of right shoulder, not specified as traumatic: Secondary | ICD-10-CM | POA: Diagnosis not present

## 2016-01-26 DIAGNOSIS — E039 Hypothyroidism, unspecified: Secondary | ICD-10-CM | POA: Insufficient documentation

## 2016-01-26 DIAGNOSIS — M94211 Chondromalacia, right shoulder: Secondary | ICD-10-CM | POA: Diagnosis not present

## 2016-01-26 DIAGNOSIS — Z79899 Other long term (current) drug therapy: Secondary | ICD-10-CM | POA: Diagnosis not present

## 2016-01-26 DIAGNOSIS — I252 Old myocardial infarction: Secondary | ICD-10-CM | POA: Insufficient documentation

## 2016-01-26 DIAGNOSIS — Z955 Presence of coronary angioplasty implant and graft: Secondary | ICD-10-CM | POA: Diagnosis not present

## 2016-01-26 DIAGNOSIS — M75121 Complete rotator cuff tear or rupture of right shoulder, not specified as traumatic: Secondary | ICD-10-CM

## 2016-01-26 HISTORY — PX: BICEPT TENODESIS: SHX5116

## 2016-01-26 HISTORY — PX: OPEN SUBSCAPULARIS REPAIR: SHX6233

## 2016-01-26 HISTORY — PX: SHOULDER ARTHROSCOPY WITH OPEN ROTATOR CUFF REPAIR: SHX6092

## 2016-01-26 HISTORY — PX: SHOULDER ARTHROSCOPY WITH SUBACROMIAL DECOMPRESSION: SHX5684

## 2016-01-26 SURGERY — ARTHROSCOPY, SHOULDER WITH REPAIR, ROTATOR CUFF, OPEN
Anesthesia: Regional | Site: Shoulder | Laterality: Right | Wound class: Clean

## 2016-01-26 MED ORDER — DEXAMETHASONE SODIUM PHOSPHATE 10 MG/ML IJ SOLN
INTRAMUSCULAR | Status: AC
  Filled 2016-01-26: qty 1

## 2016-01-26 MED ORDER — SUCCINYLCHOLINE CHLORIDE 20 MG/ML IJ SOLN
INTRAMUSCULAR | Status: DC | PRN
  Administered 2016-01-26: 100 mg via INTRAVENOUS

## 2016-01-26 MED ORDER — LIDOCAINE HCL (PF) 1 % IJ SOLN
INTRAMUSCULAR | Status: AC
  Filled 2016-01-26: qty 5

## 2016-01-26 MED ORDER — ROPIVACAINE HCL 5 MG/ML IJ SOLN
INTRAMUSCULAR | Status: DC | PRN
  Administered 2016-01-26: 30 mL via PERINEURAL

## 2016-01-26 MED ORDER — EPHEDRINE SULFATE 50 MG/ML IJ SOLN
INTRAMUSCULAR | Status: DC | PRN
  Administered 2016-01-26: 10 mg via INTRAVENOUS

## 2016-01-26 MED ORDER — MIDAZOLAM HCL 2 MG/2ML IJ SOLN
1.0000 mg | Freq: Once | INTRAMUSCULAR | Status: AC
Start: 2016-01-26 — End: 2016-01-26
  Administered 2016-01-26: 1 mg via INTRAVENOUS

## 2016-01-26 MED ORDER — ROCURONIUM BROMIDE 50 MG/5ML IV SOSY
PREFILLED_SYRINGE | INTRAVENOUS | Status: AC
  Filled 2016-01-26: qty 5

## 2016-01-26 MED ORDER — MIDAZOLAM HCL 2 MG/2ML IJ SOLN
INTRAMUSCULAR | Status: AC
Start: 2016-01-26 — End: 2016-01-26
  Administered 2016-01-26: 1 mg via INTRAVENOUS
  Filled 2016-01-26: qty 2

## 2016-01-26 MED ORDER — MIDAZOLAM HCL 2 MG/2ML IJ SOLN
1.0000 mg | Freq: Once | INTRAMUSCULAR | Status: AC
  Administered 2016-01-26: 1 mg via INTRAVENOUS

## 2016-01-26 MED ORDER — CEFAZOLIN SODIUM-DEXTROSE 2-4 GM/100ML-% IV SOLN
INTRAVENOUS | Status: AC
  Filled 2016-01-26: qty 100

## 2016-01-26 MED ORDER — PHENYLEPHRINE HCL 10 MG/ML IJ SOLN
INTRAMUSCULAR | Status: DC | PRN
  Administered 2016-01-26: 100 ug via INTRAVENOUS

## 2016-01-26 MED ORDER — CEFAZOLIN SODIUM-DEXTROSE 2-4 GM/100ML-% IV SOLN
2.0000 g | Freq: Once | INTRAVENOUS | Status: AC
  Administered 2016-01-26: 2 g via INTRAVENOUS

## 2016-01-26 MED ORDER — DEXAMETHASONE SODIUM PHOSPHATE 4 MG/ML IJ SOLN
INTRAMUSCULAR | Status: DC | PRN
  Administered 2016-01-26: 10 mg via INTRAVENOUS

## 2016-01-26 MED ORDER — BUPIVACAINE-EPINEPHRINE 0.5% -1:200000 IJ SOLN
INTRAMUSCULAR | Status: DC | PRN
Start: 2016-01-26 — End: 2016-01-26
  Administered 2016-01-26: 30 mL

## 2016-01-26 MED ORDER — PROPOFOL 10 MG/ML IV BOLUS
INTRAVENOUS | Status: AC
  Filled 2016-01-26: qty 20

## 2016-01-26 MED ORDER — ROCURONIUM BROMIDE 100 MG/10ML IV SOLN
INTRAVENOUS | Status: DC | PRN
  Administered 2016-01-26: 50 mg via INTRAVENOUS

## 2016-01-26 MED ORDER — SUCCINYLCHOLINE CHLORIDE 200 MG/10ML IV SOSY
PREFILLED_SYRINGE | INTRAVENOUS | Status: AC
  Filled 2016-01-26: qty 10

## 2016-01-26 MED ORDER — FAMOTIDINE 20 MG PO TABS
20.0000 mg | ORAL_TABLET | Freq: Once | ORAL | Status: AC
  Administered 2016-01-26: 20 mg via ORAL

## 2016-01-26 MED ORDER — LIDOCAINE 2% (20 MG/ML) 5 ML SYRINGE
INTRAMUSCULAR | Status: AC
  Filled 2016-01-26: qty 5

## 2016-01-26 MED ORDER — PHENYLEPHRINE HCL 10 MG/ML IJ SOLN
INTRAVENOUS | Status: DC | PRN
  Administered 2016-01-26: 20 ug/min via INTRAVENOUS

## 2016-01-26 MED ORDER — GLYCOPYRROLATE 0.2 MG/ML IJ SOLN
INTRAMUSCULAR | Status: DC | PRN
  Administered 2016-01-26: 0.2 mg via INTRAVENOUS

## 2016-01-26 MED ORDER — FAMOTIDINE 20 MG PO TABS
ORAL_TABLET | ORAL | Status: AC
  Filled 2016-01-26: qty 1

## 2016-01-26 MED ORDER — ROPIVACAINE HCL 5 MG/ML IJ SOLN
INTRAMUSCULAR | Status: AC
  Filled 2016-01-26: qty 40

## 2016-01-26 MED ORDER — EPHEDRINE 5 MG/ML INJ
INTRAVENOUS | Status: AC
  Filled 2016-01-26: qty 10

## 2016-01-26 MED ORDER — PROPOFOL 10 MG/ML IV BOLUS
INTRAVENOUS | Status: DC | PRN
  Administered 2016-01-26: 150 mg via INTRAVENOUS
  Administered 2016-01-26: 20 mg via INTRAVENOUS

## 2016-01-26 MED ORDER — LACTATED RINGERS IV SOLN
INTRAVENOUS | Status: DC
  Administered 2016-01-26 (×2): via INTRAVENOUS

## 2016-01-26 MED ORDER — LIDOCAINE HCL (CARDIAC) 20 MG/ML IV SOLN
INTRAVENOUS | Status: DC | PRN
  Administered 2016-01-26: 100 mg via INTRAVENOUS

## 2016-01-26 MED ORDER — FENTANYL CITRATE (PF) 100 MCG/2ML IJ SOLN
INTRAMUSCULAR | Status: DC | PRN
  Administered 2016-01-26: 100 ug via INTRAVENOUS
  Administered 2016-01-26: 50 ug via INTRAVENOUS

## 2016-01-26 MED ORDER — OXYCODONE HCL 5 MG/5ML PO SOLN: 5.0000 mg | ORAL | 0 refills | Status: DC | PRN

## 2016-01-26 MED ORDER — ACETAMINOPHEN 10 MG/ML IV SOLN
INTRAVENOUS | Status: DC | PRN
  Administered 2016-01-26: 1000 mg via INTRAVENOUS

## 2016-01-26 MED ORDER — LIDOCAINE HCL (PF) 1 % IJ SOLN
INTRAMUSCULAR | Status: DC | PRN
  Administered 2016-01-26: 5 mL via SUBCUTANEOUS

## 2016-01-26 MED ORDER — BUPIVACAINE HCL (PF) 0.5 % IJ SOLN
INTRAMUSCULAR | Status: AC
  Filled 2016-01-26: qty 30

## 2016-01-26 MED ORDER — SUGAMMADEX SODIUM 200 MG/2ML IV SOLN
INTRAVENOUS | Status: DC | PRN
  Administered 2016-01-26: 100 mg via INTRAVENOUS

## 2016-01-26 MED ORDER — EPINEPHRINE PF 1 MG/ML IJ SOLN
INTRAMUSCULAR | Status: DC | PRN
  Administered 2016-01-26: 2 mL

## 2016-01-26 MED ORDER — FENTANYL CITRATE (PF) 250 MCG/5ML IJ SOLN
INTRAMUSCULAR | Status: AC
  Filled 2016-01-26: qty 5

## 2016-01-26 MED ORDER — PHENYLEPHRINE HCL 10 MG/ML IJ SOLN
INTRAMUSCULAR | Status: AC
  Filled 2016-01-26: qty 1

## 2016-01-26 MED ORDER — GLYCOPYRROLATE 0.2 MG/ML IJ SOLN
INTRAMUSCULAR | Status: AC
  Filled 2016-01-26: qty 1

## 2016-01-26 SURGICAL SUPPLY — 47 items
ANCHOR JUGGERKNOT WTAP NDL 2.9 (Anchor) ×12 IMPLANT
ANCHOR SUT QUATTRO KNTLS 4.5 (Anchor) ×6 IMPLANT
ANCHOR SUT W/ ORTHOCORD (Anchor) ×3 IMPLANT
BIT DRILL JUGRKNT W/NDL BIT2.9 (DRILL) ×1 IMPLANT
BLADE FULL RADIUS 3.5 (BLADE) ×3 IMPLANT
BLADE SHAVER 4.5X7 STR FR (MISCELLANEOUS) IMPLANT
BUR ACROMIONIZER 4.0 (BURR) ×3 IMPLANT
BUR BR 5.5 WIDE MOUTH (BURR) IMPLANT
CANNULA SHAVER 8MMX76MM (CANNULA) ×6 IMPLANT
CHLORAPREP W/TINT 26ML (MISCELLANEOUS) ×6 IMPLANT
COVER MAYO STAND STRL (DRAPES) ×3 IMPLANT
DRAPE IMP U-DRAPE 54X76 (DRAPES) ×6 IMPLANT
DRILL JUGGERKNOT W/NDL BIT 2.9 (DRILL) ×3
DRSG OPSITE POSTOP 4X8 (GAUZE/BANDAGES/DRESSINGS) ×3 IMPLANT
ELECT REM PT RETURN 9FT ADLT (ELECTROSURGICAL) ×3
ELECTRODE REM PT RTRN 9FT ADLT (ELECTROSURGICAL) ×1 IMPLANT
GAUZE PETRO XEROFOAM 1X8 (MISCELLANEOUS) ×3 IMPLANT
GAUZE SPONGE 4X4 12PLY STRL (GAUZE/BANDAGES/DRESSINGS) ×3 IMPLANT
GLOVE BIO SURGEON STRL SZ7.5 (GLOVE) ×6 IMPLANT
GLOVE BIO SURGEON STRL SZ8 (GLOVE) ×6 IMPLANT
GLOVE BIOGEL PI IND STRL 8 (GLOVE) ×1 IMPLANT
GLOVE BIOGEL PI INDICATOR 8 (GLOVE) ×2
GLOVE INDICATOR 8.0 STRL GRN (GLOVE) ×3 IMPLANT
GOWN STRL REUS W/ TWL LRG LVL3 (GOWN DISPOSABLE) ×2 IMPLANT
GOWN STRL REUS W/ TWL XL LVL3 (GOWN DISPOSABLE) ×1 IMPLANT
GOWN STRL REUS W/TWL LRG LVL3 (GOWN DISPOSABLE) ×4
GOWN STRL REUS W/TWL XL LVL3 (GOWN DISPOSABLE) ×2
GRASPER SUT 15 45D LOW PRO (SUTURE) ×3 IMPLANT
IV LACTATED RINGER IRRG 3000ML (IV SOLUTION) ×4
IV LR IRRIG 3000ML ARTHROMATIC (IV SOLUTION) ×2 IMPLANT
MANIFOLD NEPTUNE II (INSTRUMENTS) ×3 IMPLANT
MASK FACE SPIDER DISP (MASK) ×3 IMPLANT
MAT BLUE FLOOR 46X72 FLO (MISCELLANEOUS) ×3 IMPLANT
NEEDLE REVERSE CUT 1/2 CRC (NEEDLE) IMPLANT
PACK ARTHROSCOPY SHOULDER (MISCELLANEOUS) ×3 IMPLANT
SLING ARM LRG DEEP (SOFTGOODS) IMPLANT
SLING ULTRA II LG (MISCELLANEOUS) ×3 IMPLANT
STAPLER SKIN PROX 35W (STAPLE) ×3 IMPLANT
STRAP SAFETY BODY (MISCELLANEOUS) ×3 IMPLANT
SUT ETHIBOND 0 MO6 C/R (SUTURE) ×3 IMPLANT
SUT VIC AB 2-0 CT1 27 (SUTURE) ×4
SUT VIC AB 2-0 CT1 TAPERPNT 27 (SUTURE) ×2 IMPLANT
TAPE MICROFOAM 4IN (TAPE) ×3 IMPLANT
TUBING ARTHRO INFLOW-ONLY STRL (TUBING) ×3 IMPLANT
TUBING CONNECTING 10 (TUBING) ×2 IMPLANT
TUBING CONNECTING 10' (TUBING) ×1
WAND HAND CNTRL MULTIVAC 90 (MISCELLANEOUS) ×3 IMPLANT

## 2016-01-26 NOTE — Discharge Instructions (Addendum)
AMBULATORY SURGERY  DISCHARGE INSTRUCTIONS   1) The drugs that you were given will stay in your system until tomorrow so for the next 24 hours you should not:  A) Drive an automobile B) Make any legal decisions C) Drink any alcoholic beverage   2) You may resume regular meals tomorrow.  Today it is better to start with liquids and gradually work up to solid foods.  You may eat anything you prefer, but it is better to start with liquids, then soup and crackers, and gradually work up to solid foods.   3) Please notify your doctor immediately if you have any unusual bleeding, trouble breathing, redness and pain at the surgery site, drainage, fever, or pain not relieved by medication. 4)   5) Your post-operative visit with Dr.                                     is: Date:                        Time:    Please call to schedule your post-operative visit.  6) Additional Instructions:  AMBULATORY SURGERY  DISCHARGE INSTRUCTIONS   7) The drugs that you were given will stay in your system until tomorrow so for the next 24 hours you should not:  D) Drive an automobile E) Make any legal decisions F) Drink any alcoholic beverage   8) You may resume regular meals tomorrow.  Today it is better to start with liquids and gradually work up to solid foods.  You may eat anything you prefer, but it is better to start with liquids, then soup and crackers, and gradually work up to solid foods.   9) Please notify your doctor immediately if you have any unusual bleeding, trouble breathing, redness and pain at the surgery site, drainage, fever, or pain not relieved by medication. 10)   11) Your post-operative visit with Dr.                                     is: Date:                        Time:    Please call to schedule your post-operative visit.  12) Additional Instructions: Keep dressing dry and intact.  May remove dressing and sponge bathe on post-op day #4 (Saturday).  Cover  staples with Band-Aids after drying off. Apply ice frequently to shoulder or use Polar Care device. Take ibuprofen 600 mg TID with meals for 7-10 days, then as necessary. Take liquid oxycodone as prescribed when needed.  May supplement with ES Tylenol if necessary. Keep shoulder immobilizer on at all times. Follow-up in 10-14 days or as scheduled. Hold off on starting physical therapy for 6 weeks.

## 2016-01-26 NOTE — OR Nursing (Signed)
To PACU for block 

## 2016-01-26 NOTE — Anesthesia Procedure Notes (Signed)
Anesthesia Regional Block:  Interscalene brachial plexus block  Pre-Anesthetic Checklist: ,, timeout performed, Correct Patient, Correct Site, Correct Laterality, Correct Procedure, Correct Position, site marked, Risks and benefits discussed,  Surgical consent,  Pre-op evaluation,  At surgeon's request and post-op pain management  Laterality: Right and Upper  Prep: chloraprep       Needles:  Injection technique: Single-shot  Needle Type: Stimiplex     Needle Length: 5cm 5 cm Needle Gauge: 22 and 22 G    Additional Needles:  Procedures: ultrasound guided (picture in chart) and nerve stimulator Interscalene brachial plexus block Narrative:  Start time: 01/26/2016 9:43 AM End time: 01/26/2016 9:49 AM Injection made incrementally with aspirations every 5 mL.  Performed by: Personally   Additional Notes: Functioning IV was confirmed and monitors were applied.  A 77mm 22ga Stimuplex needle was used. Sterile prep and drape,hand hygiene and sterile gloves were used.  Negative aspiration and negative test dose prior to incremental administration of local anesthetic. The patient tolerated the procedure well.

## 2016-01-26 NOTE — H&P (Signed)
Paper H&P to be scanned into permanent record. H&P reviewed. No changes. 

## 2016-01-26 NOTE — Op Note (Signed)
01/26/2016  12:52 PM  Patient:   Jermaine Campbell  Pre-Op Diagnosis:   Massive rotator cuff tear, right shoulder.  Postoperative diagnosis: Massive rotator cuff tear and biceps tendinopathy, right shoulder.  Procedure: Limited arthroscopic debridement, arthroscopic repair of subscapularis tendon tear, arthroscopic subacromial decompression, mini-open repair of supraspinatus and infraspinatus tendons, and mini-open biceps tenodesis, right shoulder.  Anesthesia: General endotracheal with interscalene block placed preoperatively by the anesthesiologist.  Surgeon:   Pascal Lux, MD  Assistant:   Cameron Proud  Findings: As above. There was a substantial partial-thickness articular surface tear involving the superior 50% of the subscapularis tendon insertional fibers as well as a significant tear involving the entire insertion of the supraspinatus and infraspinatus tendons with retraction. The biceps demonstrated some tendinopathy changes. The labrum demonstrated some fraying anteriorly and superiorly but otherwise was intact without detachment from the glenoid. There were diffuse grade 2 chondromalacial changes involving the humeral head.  Complications: None  Fluids:   950 cc  Estimated blood loss: 5 cc  Tourniquet time: None  Drains: None  Closure: Staples   Brief clinical note: The patient is a 71 year old male with a several year history of right shoulder pain resulting from numerous injuries. His most recent injury was several months ago, resulting in the inability to raise his arm without significant pain. [name] symptoms have  persisteddespite medications, activity modification, etc. [name] history and examination are consistent with impingement/tendinopathy with alarge  rotator cuff tear. These findings were confirmed by MRI scan. The patient presents at this time for definitive management of these shoulder symptoms.  Procedure: The  patient underwent placement of an interscalene block by the anesthesiologist in the preoperative holding area before he was brought into the operating room and lain in the supine position . The patient then underwent general endotracheal intubation and anesthesia before being repositioned in the beach chair position using the beach chair positioner. The  right shoulder and upper extremity were prepped with ChloraPrep solution before being draped sterilely. Preoperative antibiotics were administered. A timeout was performed to confirm the proper surgical site before the expected portal sites and incision site were injected with 0.5% Sensorcaine with epinephrine. A posterior portal was created and the glenohumeral joint thoroughly inspected with the findings as described above. An anterior portal was created using an outside-in technique. The labrum and rotator cuff were further probed, again confirming the above-noted findings.  Areas of labral fraying anteriorly and superiorly as well as some areas of focal synovitis anterosuperiorly were debrided back to stable margins using the full-radius resector. The ArthroCare wand was inserted and used to release the biceps tendon from its labral anchor. Through a separate superolateral portal site, the exposed portion of the lesser tuberosity was lightly abraded with the full-radius resector before the subscapularis tendon was repaired using a single Mitek BioKnotless anchor placed through the anterior portal. The adequacy of repair was verified by probing as well as with gentle external rotation of the shoulder. The ArthroCare wand was then reinserted and used to obtain hemostasis as well as to "anneal" the labrum superiorly and anteriorly. The instruments were removed from the joint after suctioning the excess fluid.  The camera was repositioned through the posterior portal into the subacromial space. A separate lateral portal was created using an outside-in technique.  The 3.5 mm full-radius resector was introduced and used to perform a subtotal bursectomy. The ArthroCare wand was then inserted and used to remove the periosteal tissue off the undersurface of  the anterior third of the acromion as well as to recess the coracoacromial ligament from its attachment along the anterior and lateral margins of the acromion. The 4.0 mm acromionizing bur was introduced and used to complete the decompression by removing the undersurface of the anterior third of the acromion. The full radius resector was reintroduced to remove any residual bony debris before the ArthroCare wand was reintroduced to obtain hemostasis. The instruments were then removed from the subacromial space after suctioning the excess fluid.  An approximately 4-5 cm incision was made over the anterolateral aspect of the shoulder beginning at the anterolateral corner of the acromion and extending distally in line with the bicipital groove. This incision was carried down through the subcutaneous tissues to expose the deltoid fascia. The raphae between the anterior and middle thirds was identified and this plane developed to provide access into the subacromial space. Additional bursal tissues were debrided sharply using Metzenbaum scissors. The rotator cuff tear was readily identified. The margins were debrided sharply with a #15 blade and the exposed greater tuberosity roughened with a rongeur. The tear was repaired using three Biomet 2.9 mm JuggerKnot anchors. These sutures were then brought back laterally and secured using a [employer] anchor to create a two-layer closure. An apparent watertight closure was obtained.  The bicipital groove was identified by palpation and opened for 1-1.5 cm. The biceps tendon stump was retrieved through this defect. The floor of the bicipital groove was roughened with a curet before another Biomet 2.9 mm JuggerKnot anchor was inserted. Both sets of sutures were passed through the  biceps tendon and tied securely to effect the tenodesis. The bicipital sheath was reapproximated using two #0 Ethibond interrupted sutures, incorporating the biceps tendon to further reinforce the tenodesis.  The wound was copiously irrigated with sterile saline solution before the deltoid raphae was reapproximated using 2-0 Vicryl interrupted sutures. The subcutaneous tissues were closed in two layers using 2-0 Vicryl interrupted sutures before the skin was closed using staples. The portal sites also were closed using staples. A sterile bulky dressing was applied to the shoulder before the arm was placed into a shoulder immobilizer. The patient was then awakened, extubated, and returned to the recovery room in satisfactory condition after tolerating the procedure well.

## 2016-01-26 NOTE — Anesthesia Procedure Notes (Signed)
Procedure Name: Intubation Date/Time: 01/26/2016 10:39 AM Performed by: Rosaria Ferries, Rilen Pre-anesthesia Checklist: Patient identified, Emergency Drugs available, Suction available and Patient being monitored Patient Re-evaluated:Patient Re-evaluated prior to inductionOxygen Delivery Method: Circle system utilized Preoxygenation: Pre-oxygenation with 100% oxygen Intubation Type: IV induction Laryngoscope Size: McGraph Grade View: Grade I Tube size: 7.0 mm Airway Equipment and Method: Bougie stylet Placement Confirmation: positive ETCO2,  breath sounds checked- equal and bilateral and ETT inserted through vocal cords under direct vision Secured at: 22 cm Tube secured with: Tape Dental Injury: Teeth and Oropharynx as per pre-operative assessment  Difficulty Due To: Difficult Airway- due to anterior larynx Future Recommendations: Recommend- induction with short-acting agent, and alternative techniques readily available

## 2016-01-26 NOTE — Anesthesia Postprocedure Evaluation (Signed)
Anesthesia Post Note  Patient: Jermaine Campbell  Procedure(s) Performed: Procedure(s) (LRB): SHOULDER ARTHROSCOPY WITH MINI OPEN ROTATOR CUFF REPAIR (Right) BICEPS TENODESIS- open (Right) SHOULDER ARTHROSCOPY WITH SUBACROMIAL DECOMPRESSION and debridement (Right) arthroscopic  SUBSCAPULARIS REPAIR (Right)  Patient location during evaluation: PACU Anesthesia Type: Regional Level of consciousness: awake and alert and oriented Pain management: pain level controlled Vital Signs Assessment: post-procedure vital signs reviewed and stable Respiratory status: spontaneous breathing, nonlabored ventilation and respiratory function stable Cardiovascular status: blood pressure returned to baseline and stable Postop Assessment: no signs of nausea or vomiting Anesthetic complications: no     Last Vitals:  Vitals:   01/26/16 1340 01/26/16 1429  BP: 137/87 (!) 157/92  Pulse: 62 76  Resp: 16 16  Temp:      Last Pain:  Vitals:   01/26/16 1429  TempSrc:   PainSc: 0-No pain                 Audrick Lamoureaux

## 2016-01-26 NOTE — Anesthesia Preprocedure Evaluation (Signed)
Anesthesia Evaluation  Patient identified by MRN, date of birth, ID band  Reviewed: Allergy & Precautions, H&P , NPO status , Patient's Chart, lab work & pertinent test results  History of Anesthesia Complications Negative for: history of anesthetic complications  Airway Mallampati: II  TM Distance: >3 FB Neck ROM: limited    Dental  (+) Caps, Poor Dentition, Chipped   Pulmonary neg pulmonary ROS,    Pulmonary exam normal        Cardiovascular Exercise Tolerance: Good hypertension, (-) angina+ CAD, + Past MI and + CABG  (-) Cardiac Stents (-) dysrhythmias (-) Valvular Problems/Murmurs Rhythm:regular Rate:Normal     Neuro/Psych Seizures -,  negative psych ROS   GI/Hepatic negative GI ROS, Neg liver ROS,   Endo/Other  neg diabetesHypothyroidism   Renal/GU negative Renal ROS     Musculoskeletal   Abdominal   Peds  Hematology  (+) Blood dyscrasia, anemia ,   Anesthesia Other Findings H/o throat cancer, s/p radiation.  Neck is tight.  Probable difficult intubation.  Reproductive/Obstetrics negative OB ROS                             Anesthesia Physical  Anesthesia Plan  ASA: II  Anesthesia Plan: General and Regional   Post-op Pain Management: GA combined w/ Regional for post-op pain   Induction:   Airway Management Planned:   Additional Equipment:   Intra-op Plan:   Post-operative Plan:   Informed Consent: I have reviewed the patients History and Physical, chart, labs and discussed the procedure including the risks, benefits and alternatives for the proposed anesthesia with the patient or authorized representative who has indicated his/her understanding and acceptance.     Plan Discussed with: CRNA and Anesthesiologist  Anesthesia Plan Comments:         Anesthesia Quick Evaluation

## 2016-01-26 NOTE — Transfer of Care (Signed)
Immediate Anesthesia Transfer of Care Note  Patient: Jermaine Campbell  Procedure(s) Performed: Procedure(s): SHOULDER ARTHROSCOPY WITH MINI OPEN ROTATOR CUFF REPAIR (Right) BICEPS TENODESIS- open (Right) SHOULDER ARTHROSCOPY WITH SUBACROMIAL DECOMPRESSION and debridement (Right) arthroscopic  SUBSCAPULARIS REPAIR (Right)  Patient Location: PACU  Anesthesia Type:General  Level of Consciousness: awake and patient cooperative  Airway & Oxygen Therapy: Patient Spontanous Breathing and Patient connected to nasal cannula oxygen  Post-op Assessment: Report given to RN and Post -op Vital signs reviewed and stable  Post vital signs: Reviewed and stable  Last Vitals:  Vitals:   01/26/16 1014 01/26/16 1025  BP: 122/74   Pulse: (!) 59 (!) 58  Resp: 14 16  Temp:      Last Pain:  Vitals:   01/26/16 0836  TempSrc: Oral         Complications: No apparent anesthesia complications

## 2016-01-27 ENCOUNTER — Encounter: Payer: Self-pay | Admitting: Surgery

## 2016-05-06 DIAGNOSIS — G5601 Carpal tunnel syndrome, right upper limb: Secondary | ICD-10-CM | POA: Insufficient documentation

## 2016-06-08 DIAGNOSIS — K1370 Unspecified lesions of oral mucosa: Secondary | ICD-10-CM | POA: Insufficient documentation

## 2016-07-12 ENCOUNTER — Ambulatory Visit: Payer: Self-pay | Attending: Surgery | Admitting: Occupational Therapy

## 2016-07-12 DIAGNOSIS — M25641 Stiffness of right hand, not elsewhere classified: Secondary | ICD-10-CM | POA: Insufficient documentation

## 2016-07-12 DIAGNOSIS — R208 Other disturbances of skin sensation: Secondary | ICD-10-CM | POA: Insufficient documentation

## 2016-07-12 NOTE — Therapy (Signed)
[facility] [address] [place], [address] Phone: [phone]   Fax:  [phone]  Patient Details  Name: Jermaine Campbell MRN: [id number] Date of Birth: Mar 16, 1945 Referring Provider:  Corky Mull, MD  Encounter Date: 07/12/2016   Rosalyn Gess 07/12/2016, 6:07 PM  [facility] [address], [place], [address] Phone: [phone]   Fax:  [phone]   Pt present for screen this date :  Pt report having stiffness , sensation changes in all fingers since R shoulder surgery. Pt report he had in summer last year - some numbness and stiffness in 4th and 5th digit  but did change the way he read newspaper and sat at table.   Pt report after his fall in in the fall - he could not use his arm to good and hand did had some edema and stiffness.  After surgery pt was for about 6 wks in sling - and with hand edema, stiffness and pins and needles.  Pt now still with symptoms mostly in am - stiffness, tightness and sensation changes in all digits from [facility] distally .  Pt tested for Ulnar N compression at cubital tunnel and CT - but for both negative  Semmes Weinstein done - thumb and 3rd digit was at 3.61 Grip strength R and L was even  3 point grip was only decrease compare to L by 2 lbs Lat grip was decrease by 4 lbs compare to L   Discuss with pt because of Ulnar N irritation at Cubital tunnel last year and then not using L hand normally since Nov and trauma with fall and then surgery with immobilization - wrist and hand stiffness and sensation changes should clear up - just going to take some time -  Can contact me in few months again if needed   did review with pt modifications for not irritating Cubital tunnel - observe pt several times propping up elbow   [employer] OTR/L,CLT

## 2016-12-07 ENCOUNTER — Ambulatory Visit: Payer: Self-pay | Admitting: Urology

## 2016-12-13 ENCOUNTER — Ambulatory Visit: Payer: Self-pay | Admitting: Urology

## 2016-12-28 ENCOUNTER — Encounter: Payer: Self-pay | Admitting: Urology

## 2016-12-28 ENCOUNTER — Ambulatory Visit (INDEPENDENT_AMBULATORY_CARE_PROVIDER_SITE_OTHER): Payer: Medicare Other | Admitting: Urology

## 2016-12-28 DIAGNOSIS — N401 Enlarged prostate with lower urinary tract symptoms: Secondary | ICD-10-CM

## 2016-12-28 DIAGNOSIS — N138 Other obstructive and reflux uropathy: Secondary | ICD-10-CM | POA: Diagnosis not present

## 2016-12-28 DIAGNOSIS — R972 Elevated prostate specific antigen [PSA]: Secondary | ICD-10-CM | POA: Diagnosis not present

## 2016-12-28 NOTE — Progress Notes (Signed)
12/28/2016 12:10 PM   Jermaine Campbell [birth year] [id number]  Referring provider: Marinda Elk, MD [place] [facility], [place] [address]  Chief Complaint  Patient presents with  . testicular hypofunction    HPI: 71 y.o. male followed for an elevated PSA and BPH.  I last saw him at [facility] on 11/19/2015.  A prostate biopsy was performed in March 2013 for a PSA of 5.3.  His prostate volume was calculated at 80 cc.  The biopsy was benign.  His PSA has been stable and last year was 5.05.  He has mild lower urinary tract symptoms urinary frequency and nocturia x0-2 which are not bothersome.  He denies dysuria or gross hematuria.  He denies flank, abdominal, pelvic or scrotal pain.  He does have erectile dysfunction and has tried PDE 5 inhibitors in the past which have not been effective.   PMH: Past Medical History:  Diagnosis Date  . Anemia   . Cancer [facility]) 2005   Throat - radiation txs   . Deaf    Left ear  . H/O syncope    several yrs ago  . Hypercholesteremia   . Hypertension   . Hypothyroidism    s/p radiation tx - throat CA  . Myocardial infarction ([place])    1996  . Neck stiffness    s/p radiation tx - Throat CA  . Seizures ([place])   . Vertigo    no episodes 4-5 yrs/dizziness  . Wears hearing aid    Right ear/ deaf in left ear    Surgical History: Past Surgical History:  Procedure Laterality Date  . BICEPT TENODESIS Right 01/26/2016   Procedure: BICEPS TENODESIS- open;  Surgeon: Corky Mull, MD;  Location: [facility] ORS;  Service: Orthopedics;  Laterality: Right;  . CARDIAC CATHETERIZATION     before CABG/ DR PARACHOS IS CARDIOLOGIST  . CATARACT EXTRACTION W/PHACO Left 07/30/2014   Procedure: CATARACT EXTRACTION PHACO AND INTRAOCULAR LENS PLACEMENT ([place]) SHUGARCAINE;  Surgeon: Leandrew Koyanagi, MD;  Location: [facility];  Service: Ophthalmology;  Laterality: Left;  SH[place]CA[place]  . CATARACT EXTRACTION W/PHACO Right 10/29/2014   Procedure: CATARACT EXTRACTION PHACO AND INTRAOCULAR LENS PLACEMENT ([facility]);  Surgeon: Leandrew Koyanagi, MD;  Location: [facility];  Service: Ophthalmology;  Laterality: Right;  [place]  . COLONOSCOPY    . CORONARY ARTERY BYPASS GRAFT  1996   3 vessel  . FRACTURE SURGERY     right arm  . OPEN SUBSCAPULARIS REPAIR Right 01/26/2016   Procedure: arthroscopic  SUBSCAPULARIS REPAIR;  Surgeon: Corky Mull, MD;  Location: [facility] OR[facility];  Service: Orthopedics;  Laterality: Right;  . SHOULDER ARTHROSCOPY WITH OPEN ROTATOR CUFF REPAIR Right 01/26/2016   Procedure: SHOULDER ARTHROSCOPY WITH MINI OPEN ROTATOR CUFF REPAIR;  Surgeon: Corky Mull, MD;  Location: [facility] ORS;  Service: Orthopedics;  Laterality: Right;  . SHOULDER ARTHROSCOPY WITH SUBACROMIAL DECOMPRESSION Right 01/26/2016   Procedure: SHOULDER ARTHROSCOPY WITH SUBACROMIAL DECOMPRESSION and debridement;  Surgeon: Corky Mull, MD;  Location: [facility] ORS;  Service: Orthopedics;  Laterality: Right;  . THROAT SURGERY     excision - cancer  . TONSILLECTOMY      Home Medications:  Allergies as of 12/28/2016   No Known Allergies     Medication List        Accurate as of 12/28/16 12:10 PM. Always use your most recent med list.          aspirin 81 MG tablet Take 81 mg by mouth  daily. PM   levothyroxine 88 MCG tablet Commonly known as:  SYNTHROID, LEVOTHROID Take 88 mcg by mouth every evening. PM   losartan 25 MG tablet Commonly known as:  COZAAR Take 25 mg by mouth every evening. PM   simvastatin 40 MG tablet Commonly known as:  ZOCOR Take 40 mg by mouth daily. PM   VITAMIN B-12 PO Take 3,000 mcg by mouth. PM       Allergies: No Known Allergies  Family History: Family History  Problem Relation Age of Onset  . Heart attack Father     Social History:  reports that  has never smoked. he has never used smokeless tobacco. He reports that he does not drink alcohol or use drugs.  ROS: UROLOGY Frequent Urination?:  Yes Hard to postpone urination?: No Burning/pain with urination?: No Get up at night to urinate?: Yes Leakage of urine?: No Urine stream starts and stops?: Yes Trouble starting stream?: No Do you have to strain to urinate?: No Blood in urine?: No Urinary tract infection?: No Sexually transmitted disease?: No Injury to kidneys or bladder?: No Painful intercourse?: No Weak stream?: Yes Erection problems?: Yes Penile pain?: No  Gastrointestinal Nausea?: No Vomiting?: No Indigestion/heartburn?: No Diarrhea?: No Constipation?: No  Constitutional Fever: No Night sweats?: No Weight loss?: No Fatigue?: No  Skin Skin rash/lesions?: No Itching?: No  Eyes Blurred vision?: No Double vision?: No  Ears/Nose/Throat Sore throat?: No Sinus problems?: No  Hematologic/Lymphatic Swollen glands?: No Easy bruising?: No  Cardiovascular Leg swelling?: No Chest pain?: No  Respiratory Cough?: No Shortness of breath?: No  Endocrine Excessive thirst?: No  Musculoskeletal Back pain?: No Joint pain?: No  Neurological Headaches?: No Dizziness?: No  Psychologic Depression?: No Anxiety?: No  Physical Exam: BP (!) 153/73 (BP Location: Right Arm, Patient Position: Sitting, Cuff Size: Large)   Pulse 71   Ht 5\' 9"  (1.753 m)   Wt 180 lb 6.4 oz (81.8 kg)   BMI 26.64 kg/m   Constitutional:  Alert and oriented, No acute distress. HEENT: [place] AT, moist mucus membranes.  Trachea midline, no masses. Cardiovascular: No clubbing, cyanosis, or edema. Respiratory: Normal respiratory effort, no increased work of breathing. GI: Abdomen is soft, nontender, nondistended, no abdominal masses GU: No CVA tenderness.  Prostate >80 cc.  No nodules or induration Skin: No rashes, bruises or suspicious lesions. Lymph: No cervical or inguinal adenopathy. Neurologic: Grossly intact, no focal deficits, moving all 4 extremities. Psychiatric: Normal mood and affect.  Laboratory Data: Lab Results   Component Value Date   WBC 4.5 01/11/2016   HGB 12.3 (L) 01/11/2016   HCT 37.0 (L) 01/11/2016   MCV 85.8 01/11/2016   PLT 151 01/11/2016     Assessment & Plan:   1. Elevated PSA PSA drawn today and if stable continue annual follow-up.  - PSA  2. BPH with obstruction/lower urinary tract symptoms Mild voiding symptoms which are not bothersome.  Return in about 1 year (around 12/28/2017) for Recheck, PSA.    Abbie Sons, [place] [address], [place], [place] [address] [phone]

## 2016-12-29 LAB — PSA: PROSTATE SPECIFIC AG, SERUM: 6.2 ng/mL — AB (ref 0.0–4.0)

## 2016-12-30 ENCOUNTER — Telehealth: Payer: Self-pay

## 2016-12-30 NOTE — Telephone Encounter (Signed)
Will send a letter

## 2016-12-30 NOTE — Telephone Encounter (Signed)
-----   Message from Abbie Sons, MD sent at 12/29/2016  7:21 AM EST ----- PSA slightly above baseline at 6.2.  Recommend repeating in approximately 4 months.

## 2017-01-04 DIAGNOSIS — I480 Paroxysmal atrial fibrillation: Secondary | ICD-10-CM | POA: Insufficient documentation

## 2017-01-24 DIAGNOSIS — C61 Malignant neoplasm of prostate: Secondary | ICD-10-CM

## 2017-01-24 HISTORY — DX: Malignant neoplasm of prostate: C61

## 2017-02-14 ENCOUNTER — Other Ambulatory Visit: Payer: Self-pay | Admitting: Neurology

## 2017-02-14 DIAGNOSIS — G540 Brachial plexus disorders: Secondary | ICD-10-CM

## 2017-02-21 ENCOUNTER — Ambulatory Visit
Admission: RE | Admit: 2017-02-21 | Discharge: 2017-02-21 | Disposition: A | Discharge status: Home / Self Care | Payer: Medicare Other | Source: Ambulatory Visit | Attending: Neurology | Admitting: Neurology

## 2017-02-21 DIAGNOSIS — M5124 Other intervertebral disc displacement, thoracic region: Secondary | ICD-10-CM | POA: Insufficient documentation

## 2017-02-21 DIAGNOSIS — G540 Brachial plexus disorders: Secondary | ICD-10-CM | POA: Diagnosis present

## 2017-02-21 LAB — POCT I-STAT CREATININE: CREATININE: 1.3 mg/dL — AB (ref 0.61–1.24)

## 2017-02-21 MED ORDER — GADOBENATE DIMEGLUMINE 529 MG/ML IV SOLN
17.0000 mL | Freq: Once | INTRAVENOUS | Status: AC | PRN
  Administered 2017-02-21: 17 mL via INTRAVENOUS

## 2017-04-13 ENCOUNTER — Encounter: Payer: Self-pay | Admitting: *Deleted

## 2017-04-14 ENCOUNTER — Ambulatory Visit
Admission: RE | Admit: 2017-04-14 | Discharge: 2017-04-14 | Disposition: A | Discharge status: Home / Self Care | Payer: Medicare Other | Source: Ambulatory Visit | Attending: Unknown Physician Specialty | Admitting: Unknown Physician Specialty

## 2017-04-14 ENCOUNTER — Ambulatory Visit: Payer: Medicare Other | Admitting: Anesthesiology

## 2017-04-14 ENCOUNTER — Encounter: Payer: Self-pay | Admitting: *Deleted

## 2017-04-14 ENCOUNTER — Encounter
Admission: RE | Disposition: A | Discharge status: Home / Self Care | Payer: Self-pay | Source: Ambulatory Visit | Attending: Unknown Physician Specialty

## 2017-04-14 DIAGNOSIS — Z951 Presence of aortocoronary bypass graft: Secondary | ICD-10-CM | POA: Diagnosis not present

## 2017-04-14 DIAGNOSIS — Z79890 Hormone replacement therapy (postmenopausal): Secondary | ICD-10-CM | POA: Insufficient documentation

## 2017-04-14 DIAGNOSIS — D120 Benign neoplasm of cecum: Secondary | ICD-10-CM | POA: Diagnosis not present

## 2017-04-14 DIAGNOSIS — Y842 Radiological procedure and radiotherapy as the cause of abnormal reaction of the patient, or of later complication, without mention of misadventure at the time of the procedure: Secondary | ICD-10-CM | POA: Insufficient documentation

## 2017-04-14 DIAGNOSIS — E7800 Pure hypercholesterolemia, unspecified: Secondary | ICD-10-CM | POA: Diagnosis not present

## 2017-04-14 DIAGNOSIS — I10 Essential (primary) hypertension: Secondary | ICD-10-CM | POA: Diagnosis not present

## 2017-04-14 DIAGNOSIS — I480 Paroxysmal atrial fibrillation: Secondary | ICD-10-CM | POA: Insufficient documentation

## 2017-04-14 DIAGNOSIS — Z1211 Encounter for screening for malignant neoplasm of colon: Secondary | ICD-10-CM | POA: Insufficient documentation

## 2017-04-14 DIAGNOSIS — Z791 Long term (current) use of non-steroidal anti-inflammatories (NSAID): Secondary | ICD-10-CM | POA: Insufficient documentation

## 2017-04-14 DIAGNOSIS — E890 Postprocedural hypothyroidism: Secondary | ICD-10-CM | POA: Diagnosis not present

## 2017-04-14 DIAGNOSIS — K635 Polyp of colon: Secondary | ICD-10-CM | POA: Insufficient documentation

## 2017-04-14 DIAGNOSIS — Z7901 Long term (current) use of anticoagulants: Secondary | ICD-10-CM | POA: Insufficient documentation

## 2017-04-14 DIAGNOSIS — Z79899 Other long term (current) drug therapy: Secondary | ICD-10-CM | POA: Insufficient documentation

## 2017-04-14 DIAGNOSIS — I252 Old myocardial infarction: Secondary | ICD-10-CM | POA: Diagnosis not present

## 2017-04-14 DIAGNOSIS — I2510 Atherosclerotic heart disease of native coronary artery without angina pectoris: Secondary | ICD-10-CM | POA: Diagnosis not present

## 2017-04-14 DIAGNOSIS — C140 Malignant neoplasm of pharynx, unspecified: Secondary | ICD-10-CM | POA: Diagnosis not present

## 2017-04-14 DIAGNOSIS — H9192 Unspecified hearing loss, left ear: Secondary | ICD-10-CM | POA: Insufficient documentation

## 2017-04-14 HISTORY — DX: Cardiac arrhythmia, unspecified: I49.9

## 2017-04-14 HISTORY — DX: Bradycardia, unspecified: R00.1

## 2017-04-14 HISTORY — DX: Atherosclerotic heart disease of native coronary artery without angina pectoris: I25.10

## 2017-04-14 HISTORY — PX: COLONOSCOPY WITH PROPOFOL: SHX5780

## 2017-04-14 SURGERY — COLONOSCOPY WITH PROPOFOL
Anesthesia: General

## 2017-04-14 MED ORDER — LIDOCAINE HCL (PF) 1 % IJ SOLN
2.0000 mL | Freq: Once | INTRAMUSCULAR | Status: AC
Start: 2017-04-14 — End: 2017-04-14
  Administered 2017-04-14: 20 mg via INTRADERMAL

## 2017-04-14 MED ORDER — EPHEDRINE SULFATE 50 MG/ML IJ SOLN
INTRAMUSCULAR | Status: AC
  Filled 2017-04-14: qty 1

## 2017-04-14 MED ORDER — FENTANYL CITRATE (PF) 100 MCG/2ML IJ SOLN
INTRAMUSCULAR | Status: AC
  Filled 2017-04-14: qty 2

## 2017-04-14 MED ORDER — PROPOFOL 10 MG/ML IV BOLUS
INTRAVENOUS | Status: AC
  Filled 2017-04-14: qty 20

## 2017-04-14 MED ORDER — LIDOCAINE HCL (PF) 1 % IJ SOLN
INTRAMUSCULAR | Status: AC
  Filled 2017-04-14: qty 2

## 2017-04-14 MED ORDER — EPHEDRINE SULFATE 50 MG/ML IJ SOLN
INTRAMUSCULAR | Status: DC | PRN
  Administered 2017-04-14: 10 mg via INTRAVENOUS

## 2017-04-14 MED ORDER — PROPOFOL 10 MG/ML IV BOLUS
INTRAVENOUS | Status: DC | PRN
  Administered 2017-04-14: 40 mg via INTRAVENOUS

## 2017-04-14 MED ORDER — GLYCOPYRROLATE 0.2 MG/ML IJ SOLN
INTRAMUSCULAR | Status: DC | PRN
  Administered 2017-04-14: 0.2 mg via INTRAVENOUS

## 2017-04-14 MED ORDER — SODIUM CHLORIDE 0.9 % IV SOLN: INTRAVENOUS | Status: DC

## 2017-04-14 MED ORDER — GLYCOPYRROLATE 0.2 MG/ML IJ SOLN
INTRAMUSCULAR | Status: AC
  Filled 2017-04-14: qty 1

## 2017-04-14 MED ORDER — FENTANYL CITRATE (PF) 100 MCG/2ML IJ SOLN
INTRAMUSCULAR | Status: DC | PRN
  Administered 2017-04-14: 50 ug via INTRAVENOUS

## 2017-04-14 MED ORDER — SODIUM CHLORIDE 0.9 % IV SOLN
INTRAVENOUS | Status: DC
  Administered 2017-04-14: 09:00:00 via INTRAVENOUS

## 2017-04-14 NOTE — Anesthesia Post-op Follow-up Note (Signed)
Anesthesia QCDR form completed.        

## 2017-04-14 NOTE — Anesthesia Preprocedure Evaluation (Signed)
Anesthesia Evaluation  Patient identified by MRN, date of birth, ID band  Reviewed: Allergy & Precautions, H&P , NPO status , Patient's Chart, lab work & pertinent test results  History of Anesthesia Complications Negative for: history of anesthetic complications  Airway Mallampati: II  TM Distance: >3 FB Neck ROM: limited    Dental  (+) Caps, Poor Dentition, Chipped   Pulmonary neg pulmonary ROS,           Cardiovascular Exercise Tolerance: Good hypertension, (-) angina+ CAD, + Past MI and + CABG  (-) Cardiac Stents + dysrhythmias Atrial Fibrillation (-) Valvular Problems/Murmurs     Neuro/Psych Seizures -,  negative psych ROS   GI/Hepatic negative GI ROS, Neg liver ROS,   Endo/Other  neg diabetesHypothyroidism   Renal/GU negative Renal ROS     Musculoskeletal   Abdominal   Peds  Hematology  (+) Blood dyscrasia, anemia ,   Anesthesia Other Findings H/o throat cancer, s/p radiation.  Neck is tight.  Probable difficult intubation.  Reproductive/Obstetrics negative OB ROS                             Anesthesia Physical  Anesthesia Plan  ASA: II  Anesthesia Plan: General   Post-op Pain Management:    Induction: Intravenous  PONV Risk Score and Plan: 2 and Propofol infusion  Airway Management Planned: Natural Airway and Nasal Cannula  Additional Equipment:   Intra-op Plan:   Post-operative Plan:   Informed Consent: I have reviewed the patients History and Physical, chart, labs and discussed the procedure including the risks, benefits and alternatives for the proposed anesthesia with the patient or authorized representative who has indicated his/her understanding and acceptance.     Plan Discussed with: CRNA and Anesthesiologist  Anesthesia Plan Comments:         Anesthesia Quick Evaluation

## 2017-04-14 NOTE — Transfer of Care (Signed)
Immediate Anesthesia Transfer of Care Note  Patient: Jermaine Campbell  Procedure(s) Performed: COLONOSCOPY WITH PROPOFOL (N/A )  Patient Location: PACU  Anesthesia Type:General  Level of Consciousness: awake  Airway & Oxygen Therapy: Patient Spontanous Breathing and Patient connected to nasal cannula oxygen  Post-op Assessment: Report given to RN and Post -op Vital signs reviewed and stable  Post vital signs: Reviewed and stable  Last Vitals:  Vitals Value Taken Time  BP 107/71 04/14/2017  9:56 AM  Temp 36.4 C 04/14/2017  9:55 AM  Pulse 60 04/14/2017  9:59 AM  Resp 15 04/14/2017  9:59 AM  SpO2 100 % 04/14/2017  9:59 AM  Vitals shown include unvalidated device data.  Last Pain:  Vitals:   04/14/17 0955  TempSrc:   PainSc: 0-No pain         Complications: No apparent anesthesia complications

## 2017-04-14 NOTE — H&P (Signed)
Primary Care Physician:  Marinda Elk, MD Primary Gastroenterologist:  Dr. Vira Agar  Pre-Procedure History & Physical: HPI:  Jermaine Campbell is a 72 y.o. male is here for an colonoscopy.  This is for [facility] of colon polyps.   Past Medical History:  Diagnosis Date  . Anemia   . Bradycardia   . 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 Coronary artery disease    atherosclerosis of native coronary artery of native heart without angina pectoris  . Deaf    Left ear  . Dysrhythmia    paroxysmal atrial fibrillation  . H/O syncope    several yrs ago  . Hypercholesteremia   . Hypertension   . Hypothyroidism    s/p radiation tx - throat CA  . Myocardial infarction ([place])    1996  . Neck stiffness    s/p radiation tx - Throat CA  . Seizures ([place])   . Vertigo    no episodes 4-5 yrs/dizziness  . Wears hearing aid    Right ear/ deaf in left ear    Past Surgical History:  Procedure Laterality Date  . BICEPT TENODESIS Right 01/26/2016   Procedure: BICEPS TENODESIS- open;  Surgeon: Corky Mull, MD;  Location: [facility] ORS;  Service: Orthopedics;  Laterality: Right;  . CARDIAC CATHETERIZATION     before CABG/ DR PARACHOS IS CARDIOLOGIST  . CATARACT EXTRACTION W/PHACO Left 07/30/2014   Procedure: CATARACT EXTRACTION PHACO AND INTRAOCULAR LENS PLACEMENT ([place]) SHUGARCAINE;  Surgeon: Leandrew Koyanagi, MD;  Location: [place];  Service: Ophthalmology;  Laterality: Left;  [place]INE  . CATARACT EXTRACTION W/PHACO Right 10/29/2014   Procedure: CATARACT EXTRACTION PHACO AND INTRAOCULAR LENS PLACEMENT ([facility]);  Surgeon: Leandrew Koyanagi, MD;  Location: [place];  Service: Ophthalmology;  Laterality: Right;  [place]  . COLONOSCOPY    . CORONARY ARTERY BYPASS GRAFT  1996   3 vessel  . FRACTURE SURGERY     right arm  . OPEN SUBSCAPULARIS REPAIR Right 01/26/2016   Procedure: arthroscopic  SUBSCAPULARIS REPAIR;   Surgeon: Corky Mull, MD;  Location: [facility] OR[facility];  Service: Orthopedics;  Laterality: Right;  . SHOULDER ARTHROSCOPY WITH OPEN ROTATOR CUFF REPAIR Right 01/26/2016   Procedure: SHOULDER ARTHROSCOPY WITH MINI OPEN ROTATOR CUFF REPAIR;  Surgeon: Corky Mull, MD;  Location: [facility] OR[facility];  Service: Orthopedics;  Laterality: Right;  . SHOULDER ARTHROSCOPY WITH SUBACROMIAL DECOMPRESSION Right 01/26/2016   Procedure: SHOULDER ARTHROSCOPY WITH SUBACROMIAL DECOMPRESSION and debridement;  Surgeon: Corky Mull, MD;  Location: [facility] OR[facility];  Service: Orthopedics;  Laterality: Right;  . THROAT SURGERY     excision - cancer  . TONSILLECTOMY      Prior to Admission medications   Medication Sig Start Date End Date Taking? Authorizing Provider  ibuprofen (ADVIL,MOTRIN) 200 MG tablet Take 200 mg by mouth every 6 (six) hours as needed.   Yes [provider]  metoprolol tartrate (LOPRESSOR) 25 MG tablet Take 25 mg by mouth 2 (two) times daily.   Yes [provider]  Rivaroxaban (XARELTO PO) Take by mouth.   Yes [provider]  Cyanocobalamin (VITAMIN B-12 PO) Take 3,000 mcg by mouth. PM    [provider]  levothyroxine (SYNTHROID, LEVOTHROID) 88 MCG tablet Take 88 mcg by mouth every evening. PM     [provider]  losartan (COZAAR) 25 MG tablet Take 25 mg by mouth every evening. PM  [provider]  simvastatin (ZOCOR) 40 MG tablet Take 40 mg by mouth daily. PM    [provider]    Allergies as of 02/27/2017  . (No Known Allergies)    Family History  Problem Relation Age of Onset  . Heart attack Father     Social History   Socioeconomic History  . Marital status: Married    Spouse name: Not on file  . Number of children: Not on file  . Years of education: Not on file  . Highest education level: Not on file  Occupational History  . Not on file  Social Needs  . Financial resource strain: Not on file  . Food insecurity:    Worry: Not on  file    Inability: Not on file  . Transportation needs:    Medical: Not on file    Non-medical: Not on file  Tobacco Use  . Smoking status: Never Smoker  . Smokeless tobacco: Never Used  Substance and Sexual Activity  . Alcohol use: No    Comment: rarely  . Drug use: No  . Sexual activity: Not on file  Lifestyle  . Physical activity:    Days per week: Not on file    Minutes per session: Not on file  . Stress: Not on file  Relationships  . Social connections:    Talks on phone: Not on file    Gets together: Not on file    Attends religious service: Not on file    Active member of club or organization: Not on file    Attends meetings of clubs or organizations: Not on file    Relationship status: Not on file  . Intimate partner violence:    Fear of current or ex partner: Not on file    Emotionally abused: Not on file    Physically abused: Not on file    Forced sexual activity: Not on file  Other Topics Concern  . Not on file  Social History Narrative  . Not on file    Review of Systems: See HPI, otherwise negative ROS  Physical Exam: BP (!) 169/74   Pulse (!) 51   Temp (!) 97.1 F (36.2 C) (Tympanic)   Resp 18   Ht 5\' 9"  (1.753 m)   Wt 80.3 kg (177 lb)   SpO2 99%   BMI 26.14 kg/m  General:   Alert,  pleasant and cooperative in NAD Head:  Normocephalic and atraumatic. Neck:  Supple; no masses or thyromegaly. Lungs:  Clear throughout to auscultation.    Heart:  Regular rate and rhythm. Abdomen:  Soft, nontender and nondistended. Normal bowel sounds, without guarding, and without rebound.   Neurologic:  Alert and  oriented x4;  grossly normal neurologically.  Impression/Plan: Jermaine Campbell is here for an colonoscopy to be performed for [facility] colon polyps  Risks, benefits, limitations, and alternatives regarding  colonoscopy have been reviewed with the patient.  Questions have been answered.  All parties agreeable.   Gaylyn Cheers, MD  04/14/2017, 9:16  AM

## 2017-04-14 NOTE — Anesthesia Postprocedure Evaluation (Signed)
Anesthesia Post Note  Patient: Jermaine Campbell  Procedure(s) Performed: COLONOSCOPY WITH PROPOFOL (N/A )  Patient location during evaluation: Endoscopy Anesthesia Type: General Level of consciousness: awake and alert Pain management: pain level controlled Vital Signs Assessment: post-procedure vital signs reviewed and stable Respiratory status: spontaneous breathing, nonlabored ventilation, respiratory function stable and patient connected to nasal cannula oxygen Cardiovascular status: blood pressure returned to baseline and stable Postop Assessment: no apparent nausea or vomiting Anesthetic complications: no     Last Vitals:  Vitals Value Taken Time  BP    Temp    Pulse    Resp    SpO2      Last Pain:  Vitals:   04/14/17 1035  TempSrc:   PainSc: 0-No pain                 Martha Clan

## 2017-04-14 NOTE — Op Note (Signed)
[facility] Gastroenterology Patient Name: Jermaine Campbell Procedure Date: 04/14/2017 9:04 AM MRN: [id number] Account #: [id number] Date of Birth: 1945-09-05 Admit Type: Outpatient Age: 72 Room: [facility] ENDO ROOM 1 Gender: Male Note Status: Finalized Procedure:            Colonoscopy Indications:          High risk colon cancer surveillance: Personal history                        of colonic polyps Providers:            Manya Silvas, MD Referring MD:         Precious Bard, MD (Referring MD) Medicines:            Propofol per Anesthesia Complications:        No immediate complications. Procedure:            Pre-Anesthesia Assessment:                       - After reviewing the risks and benefits, the patient                        was deemed in satisfactory condition to undergo the                        procedure.                       After obtaining informed consent, the colonoscope was                        passed under direct vision. Throughout the procedure,                        the patient's blood pressure, pulse, and oxygen                        saturations were monitored continuously. The                        Colonoscope was introduced through the anus and                        advanced to the the cecum, identified by appendiceal                        orifice and ileocecal valve. The colonoscopy was                        performed without difficulty. The patient tolerated the                        procedure well. The quality of the bowel preparation                        was excellent. Findings:      Three sessile polyps were found in the transverse colon, hepatic flexure       and cecum. The polyps were diminutive in size. These polyps were removed       with a jumbo cold forceps. Resection and retrieval were complete.  The exam was otherwise without abnormality. Impression:           - Three diminutive polyps in the transverse  colon, at                        the hepatic flexure and in the cecum, removed with a                        jumbo cold forceps. Resected and retrieved.                       - The examination was otherwise normal. Recommendation:       - Await pathology results. Manya Silvas, MD 04/14/2017 9:47:42 AM This report has been signed electronically. Number of Addenda: 0 Note Initiated On: 04/14/2017 9:04 AM Scope Withdrawal Time: 0 hours 3 minutes 41 seconds  Total Procedure Duration: 0 hours 16 minutes 7 seconds       [facility]

## 2017-04-14 NOTE — Anesthesia Procedure Notes (Signed)
Date/Time: 04/14/2017 9:30 AM Performed by: Allean Found, CRNA Pre-anesthesia Checklist: Patient identified, Emergency Drugs available, Suction available, Patient being monitored and Timeout performed Oxygen Delivery Method: Nasal cannula Placement Confirmation: positive ETCO2

## 2017-04-17 ENCOUNTER — Encounter: Payer: Self-pay | Admitting: Unknown Physician Specialty

## 2017-04-17 LAB — SURGICAL PATHOLOGY

## 2017-05-01 ENCOUNTER — Other Ambulatory Visit: Payer: Self-pay | Admitting: Family Medicine

## 2017-05-01 ENCOUNTER — Other Ambulatory Visit: Payer: Medicare Other

## 2017-05-01 DIAGNOSIS — R972 Elevated prostate specific antigen [PSA]: Secondary | ICD-10-CM

## 2017-05-02 ENCOUNTER — Other Ambulatory Visit: Payer: Self-pay | Admitting: Urology

## 2017-05-02 ENCOUNTER — Telehealth: Payer: Self-pay | Admitting: Family Medicine

## 2017-05-02 DIAGNOSIS — R972 Elevated prostate specific antigen [PSA]: Secondary | ICD-10-CM

## 2017-05-02 LAB — PSA: Prostate Specific Ag, Serum: 7 ng/mL — ABNORMAL HIGH (ref 0.0–4.0)

## 2017-05-02 NOTE — Telephone Encounter (Signed)
-----   Message from Abbie Sons, MD sent at 05/02/2017 12:16 PM EDT ----- PSA has increased to 7.0.  Recommend scheduling a prostate MRI.

## 2017-05-02 NOTE — Telephone Encounter (Signed)
Left message for patient to return call.

## 2017-05-03 NOTE — Telephone Encounter (Signed)
-----   Message from Abbie Sons, MD sent at 05/02/2017 12:16 PM EDT ----- PSA has increased to 7.0.  Recommend scheduling a prostate MRI.

## 2017-05-03 NOTE — Telephone Encounter (Signed)
Spoke with patient all information given Instructions mailed to patient   Jermaine Campbell

## 2017-05-16 ENCOUNTER — Ambulatory Visit
Admission: RE | Admit: 2017-05-16 | Discharge: 2017-05-16 | Disposition: A | Discharge status: Home / Self Care | Payer: Medicare Other | Source: Ambulatory Visit | Attending: Urology | Admitting: Urology

## 2017-05-16 DIAGNOSIS — R972 Elevated prostate specific antigen [PSA]: Secondary | ICD-10-CM

## 2017-05-16 MED ORDER — GADOBENATE DIMEGLUMINE 529 MG/ML IV SOLN
17.0000 mL | Freq: Once | INTRAVENOUS | Status: AC | PRN
  Administered 2017-05-16: 17 mL via INTRAVENOUS

## 2017-05-22 ENCOUNTER — Telehealth: Payer: Self-pay | Admitting: Urology

## 2017-05-22 NOTE — Telephone Encounter (Signed)
Pt wife called office asking for her husband's prostate MRI results, Pt had MRI in [place] last week, 05/16/2017.  Pt and Pt wife anxiously awaits results. Please advise.  Thanks.

## 2017-05-24 NOTE — Telephone Encounter (Signed)
I discussed MRI results.  Need to schedule fusion biopsy at [facility].  Will also need to touch base with Dr. Saralyn Pilar office at [facility] regarding stopping Xarelto prior to biopsy.  Jermaine Campbell recently did this for a colonoscopy.

## 2017-05-25 NOTE — Telephone Encounter (Signed)
Dr. Bernardo Heater has spoken w/ pt, please get pt scheduled for fusion biopsy at [place]. I have faxed cardiac clearance to Dr. Saralyn Pilar.

## 2017-06-06 ENCOUNTER — Telehealth: Payer: Self-pay | Admitting: Urology

## 2017-06-06 NOTE — Telephone Encounter (Signed)
done

## 2017-06-28 ENCOUNTER — Other Ambulatory Visit: Payer: Self-pay | Admitting: Urology

## 2017-06-28 DIAGNOSIS — R1313 Dysphagia, pharyngeal phase: Secondary | ICD-10-CM | POA: Insufficient documentation

## 2017-06-30 ENCOUNTER — Telehealth: Payer: Self-pay | Admitting: Urology

## 2017-06-30 NOTE — Telephone Encounter (Signed)
Patient and his wife are calling about the results of his fusion biopsy.  I confirmed that they have a follow up visit scheduled.  Patient states that they do not want to wait until 6/17 to find out the results.  Please advise.

## 2017-07-03 ENCOUNTER — Ambulatory Visit
Admission: RE | Admit: 2017-07-03 | Discharge: 2017-07-03 | Disposition: A | Discharge status: Home / Self Care | Payer: Medicare Other | Source: Ambulatory Visit | Attending: Physician Assistant | Admitting: Physician Assistant

## 2017-07-03 ENCOUNTER — Ambulatory Visit: Payer: Medicare Other

## 2017-07-03 ENCOUNTER — Other Ambulatory Visit: Payer: Self-pay | Admitting: Physician Assistant

## 2017-07-03 DIAGNOSIS — R140 Abdominal distension (gaseous): Secondary | ICD-10-CM | POA: Diagnosis present

## 2017-07-03 DIAGNOSIS — R1084 Generalized abdominal pain: Secondary | ICD-10-CM

## 2017-07-03 DIAGNOSIS — I700 Atherosclerosis of aorta: Secondary | ICD-10-CM | POA: Insufficient documentation

## 2017-07-03 DIAGNOSIS — C61 Malignant neoplasm of prostate: Secondary | ICD-10-CM | POA: Insufficient documentation

## 2017-07-03 DIAGNOSIS — N200 Calculus of kidney: Secondary | ICD-10-CM | POA: Insufficient documentation

## 2017-07-03 DIAGNOSIS — N320 Bladder-neck obstruction: Secondary | ICD-10-CM | POA: Insufficient documentation

## 2017-07-03 MED ORDER — IOHEXOL 300 MG/ML  SOLN
75.0000 mL | Freq: Once | INTRAMUSCULAR | Status: AC | PRN
  Administered 2017-07-03: 75 mL via INTRAVENOUS

## 2017-07-04 ENCOUNTER — Ambulatory Visit (INDEPENDENT_AMBULATORY_CARE_PROVIDER_SITE_OTHER): Payer: Medicare Other

## 2017-07-04 DIAGNOSIS — N401 Enlarged prostate with lower urinary tract symptoms: Secondary | ICD-10-CM

## 2017-07-04 DIAGNOSIS — N138 Other obstructive and reflux uropathy: Secondary | ICD-10-CM | POA: Diagnosis not present

## 2017-07-04 LAB — BLADDER SCAN AMB NON-IMAGING

## 2017-07-04 NOTE — Progress Notes (Signed)
Bladder Scan Patient can void: <1131ml ml Performed By: Fonnie Jarvis, CMA   Simple Catheter Placement  Due to urinary retention patient is present today for a foley cath placement.  Patient was cleaned and prepped in a sterile fashion with betadine and lidocaine jelly 2% was instilled into the urethra.  A 16 coude FR foley catheter was inserted, urine return was noted  2110ml, urine was yellow in color.  The balloon was filled with 10cc of sterile water.  A leg bag was attached for drainage. Patient was also given a night bag to take home and was given instruction on how to change from one bag to another.  Patient was given instruction on proper catheter care.  Patient tolerated well, no complications were noted   Preformed by: Fonnie Jarvis, CMA  Additional notes/ Follow up: keep follow up with Dr. Bernardo Heater next week

## 2017-07-05 NOTE — Telephone Encounter (Signed)
Patient's wife called and stated there was no need to call them back they got a call from [place] with his fusion biopsy results and they are all good. Michela Pitcher they would see you on Monday for his appointment.    [name]

## 2017-07-10 ENCOUNTER — Encounter: Payer: Self-pay | Admitting: Urology

## 2017-07-10 ENCOUNTER — Ambulatory Visit (INDEPENDENT_AMBULATORY_CARE_PROVIDER_SITE_OTHER): Payer: Medicare Other | Admitting: Urology

## 2017-07-10 VITALS — BP 95/64 | HR 59 | Resp 16 | Ht 69.0 in | Wt 168.2 lb

## 2017-07-10 DIAGNOSIS — C61 Malignant neoplasm of prostate: Secondary | ICD-10-CM | POA: Diagnosis not present

## 2017-07-10 MED ORDER — TAMSULOSIN HCL 0.4 MG PO CAPS: 0.4000 mg | ORAL_CAPSULE | Freq: Every day | ORAL | 0 refills | Status: DC

## 2017-07-10 NOTE — Progress Notes (Signed)
07/10/2017 3:54 PM   Jermaine Campbell [birth year] [id number]  Referring provider: Marinda Elk, MD [place] [place] [address]  Chief Complaint  Patient presents with  . Follow-up    HPI: 72 year old male presents for prostate biopsy follow-up.  He had a previous benign prostate biopsy in 2013 and recent PSA had increased to 7.0.  A prostate MRI was remarkable for a small PI-RADS 4 lesion in the left apical peripheral zone.  He was also noted to have a distended bladder with mild bilateral hydroureter.  Fusion biopsy was performed in [place] on 06/26/2017.  He did develop symptomatic urinary retention several days after the biopsy and was seen here for catheter placement.  He is not currently taking an alpha-blocker.  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  Prostate MRI showed no lymphadenopathy or evidence of extracapsular extension.  They were contacted by Dr. Tresa Moore in [place] with recommendations of radiation therapy.   PMH: Past Medical History:  Diagnosis Date  . Anemia   . Bradycardia   . 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 Coronary artery disease    atherosclerosis of native coronary artery of native heart without angina pectoris  . Deaf    Left ear  . Dysrhythmia    paroxysmal atrial fibrillation  . H/O syncope    several yrs ago  . Hypercholesteremia   . Hypertension   . Hypothyroidism    s/p radiation tx - throat CA  . Myocardial infarction ([place])    1996  . Neck stiffness    s/p radiation tx - Throat CA  . Seizures ([place])   . Vertigo    no episodes 4-5 yrs/dizziness  . Wears hearing aid    Right ear/ deaf in left ear    Surgical History: Past Surgical History:    Procedure Laterality Date  . BICEPT TENODESIS Right 01/26/2016   Procedure: BICEPS TENODESIS- open;  Surgeon: Corky Mull, MD;  Location: [facility] OR[facility];  Service: Orthopedics;  Laterality: Right;  . CARDIAC CATHETERIZATION     before CABG/ DR PARACHOS IS CARDIOLOGIST  . CATARACT EXTRACTION W/PHACO Left 07/30/2014   Procedure: CATARACT EXTRACTION PHACO AND INTRAOCULAR LENS PLACEMENT ([place]) SHUGARCAINE;  Surgeon: Leandrew Koyanagi, MD;  Location: [place];  Service: Ophthalmology;  Laterality: Left;  [place]  . CATARACT EXTRACTION W/PHACO Right 10/29/2014   Procedure: CATARACT EXTRACTION PHACO AND INTRAOCULAR LENS PLACEMENT ([facility]);  Surgeon: Leandrew Koyanagi, MD;  Location: [place];  Service: Ophthalmology;  Laterality: Right;  [place]  . COLONOSCOPY    . COLONOSCOPY WITH PROPOFOL N/A 04/14/2017   Procedure: COLONOSCOPY WITH PROPOFOL;  Surgeon: Manya Silvas, MD;  [facility];  Service: Endoscopy;  Laterality: N/A;  . CORONARY ARTERY BYPASS GRAFT  1996   3 vessel  . FRACTURE SURGERY     right arm  . OPEN SUBSCAPULARIS REPAIR Right 01/26/2016   Procedure: arthroscopic  SUBSCAPULARIS REPAIR;  Surgeon: Corky Mull, MD;  Location: [facility] OR[facility];  Service: Orthopedics;  Laterality: Right;  . SHOULDER ARTHROSCOPY WITH OPEN ROTATOR CUFF REPAIR Right 01/26/2016   Procedure: SHOULDER ARTHROSCOPY WITH MINI OPEN ROTATOR CUFF REPAIR;  Surgeon: Corky Mull, MD;  Location: [facility];  Service: Orthopedics;  Laterality:  Right;  [facility] SHOULDER ARTHROSCOPY WITH SUBACROMIAL DECOMPRESSION Right 01/26/2016   Procedure: SHOULDER ARTHROSCOPY WITH SUBACROMIAL DECOMPRESSION and debridement;  Surgeon: Corky Mull, MD;  Location: [facility] OR[facility];  Service: Orthopedics;  Laterality: Right;  . THROAT SURGERY     excision - cancer  . TONSILLECTOMY      Home Medications:  Allergies as of 07/10/2017   No Known Allergies     Medication List        Accurate as of 07/10/17  3:54 PM. Always use  your most recent med list.          ibuprofen 200 MG tablet Commonly known as:  ADVIL,MOTRIN Take 200 mg by mouth every 6 (six) hours as needed.   levothyroxine 88 MCG tablet Commonly known as:  SYNTHROID, LEVOTHROID Take 88 mcg by mouth every evening. PM   losartan 25 MG tablet Commonly known as:  COZAAR Take 25 mg by mouth every evening. PM   metoprolol tartrate 25 MG tablet Commonly known as:  LOPRESSOR Take 25 mg by mouth 2 (two) times daily.   simvastatin 40 MG tablet Commonly known as:  ZOCOR Take 40 mg by mouth daily. PM   VITAMIN B-12 PO Take 3,000 mcg by mouth. PM   XARELTO PO Take by mouth.       Allergies: No Known Allergies  Family History: Family History  Problem Relation Age of Onset  . Heart attack Father     Social History:  reports that he has never smoked. He has never used smokeless tobacco. He reports that he does not drink alcohol or use drugs.  ROS: UROLOGY Frequent Urination?: Yes Hard to postpone urination?: Yes Burning/pain with urination?: Yes Get up at night to urinate?: Yes Leakage of urine?: No Urine stream starts and stops?: Yes Trouble starting stream?: No Do you have to strain to urinate?: Yes Blood in urine?: No Urinary tract infection?: No Sexually transmitted disease?: No Injury to kidneys or bladder?: No Painful intercourse?: No Weak stream?: Yes Erection problems?: No Penile pain?: No  Gastrointestinal Nausea?: No Vomiting?: No Indigestion/heartburn?: No Diarrhea?: No Constipation?: Yes  Constitutional Fever: No Night sweats?: No Weight loss?: Yes Fatigue?: No  Skin Skin rash/lesions?: No Itching?: No  Eyes Blurred vision?: No Double vision?: No  Ears/Nose/Throat Sore throat?: No Sinus problems?: No  Hematologic/Lymphatic Swollen glands?: No Easy bruising?: No  Cardiovascular Leg swelling?: Yes Chest pain?: No  Respiratory Cough?: No Shortness of breath?: No  Endocrine Excessive  thirst?: No  Musculoskeletal Back pain?: No Joint pain?: No  Neurological Headaches?: No Dizziness?: Yes  Psychologic Depression?: No Anxiety?: No  Physical Exam: BP 95/64   Pulse (!) 59   Resp 16   Ht 5\' 9"  (1.753 m)   Wt 168 lb 3.2 oz (76.3 kg)   SpO2 96%   BMI 24.84 kg/m   Constitutional:  Alert and oriented, No acute distress.   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  Pelvic imaging shows no adenopathy or evidence of extracapsular extension.  He underwent radiation for head neck cancer at [facility] and they have requested referral to radiation oncology there.  He will need a bone scan however they would like to have performed at [facility].  His Foley catheter has been indwelling for 1 week.  Will start tamsulosin and he will return next week for catheter removal/voiding trial.  He is interested in learning intermittent catheterization should he have persistent retention.  Greater than 50% of  this 15-minute visit was spent counseling the patient.   Abbie Sons, [place] [address] [place], [place] [address] [phone]

## 2017-07-11 ENCOUNTER — Encounter: Payer: Self-pay | Admitting: Urology

## 2017-07-12 ENCOUNTER — Emergency Department: Payer: Medicare Other

## 2017-07-12 ENCOUNTER — Other Ambulatory Visit: Payer: Self-pay

## 2017-07-12 ENCOUNTER — Emergency Department
Admission: EM | Admit: 2017-07-12 | Discharge: 2017-07-12 | Disposition: A | Discharge status: Home / Self Care | Payer: Medicare Other | Attending: Emergency Medicine | Admitting: Emergency Medicine

## 2017-07-12 DIAGNOSIS — J690 Pneumonitis due to inhalation of food and vomit: Secondary | ICD-10-CM | POA: Insufficient documentation

## 2017-07-12 DIAGNOSIS — I2510 Atherosclerotic heart disease of native coronary artery without angina pectoris: Secondary | ICD-10-CM | POA: Diagnosis not present

## 2017-07-12 DIAGNOSIS — Z7901 Long term (current) use of anticoagulants: Secondary | ICD-10-CM | POA: Diagnosis not present

## 2017-07-12 DIAGNOSIS — C61 Malignant neoplasm of prostate: Secondary | ICD-10-CM | POA: Insufficient documentation

## 2017-07-12 DIAGNOSIS — R339 Retention of urine, unspecified: Secondary | ICD-10-CM | POA: Insufficient documentation

## 2017-07-12 DIAGNOSIS — N189 Chronic kidney disease, unspecified: Secondary | ICD-10-CM

## 2017-07-12 DIAGNOSIS — N289 Disorder of kidney and ureter, unspecified: Secondary | ICD-10-CM

## 2017-07-12 DIAGNOSIS — N179 Acute kidney failure, unspecified: Secondary | ICD-10-CM | POA: Diagnosis not present

## 2017-07-12 DIAGNOSIS — E039 Hypothyroidism, unspecified: Secondary | ICD-10-CM | POA: Insufficient documentation

## 2017-07-12 DIAGNOSIS — I951 Orthostatic hypotension: Secondary | ICD-10-CM

## 2017-07-12 DIAGNOSIS — Z951 Presence of aortocoronary bypass graft: Secondary | ICD-10-CM | POA: Insufficient documentation

## 2017-07-12 DIAGNOSIS — I252 Old myocardial infarction: Secondary | ICD-10-CM | POA: Diagnosis not present

## 2017-07-12 DIAGNOSIS — R55 Syncope and collapse: Secondary | ICD-10-CM | POA: Diagnosis present

## 2017-07-12 DIAGNOSIS — I10 Essential (primary) hypertension: Secondary | ICD-10-CM | POA: Insufficient documentation

## 2017-07-12 DIAGNOSIS — Z85818 Personal history of malignant neoplasm of other sites of lip, oral cavity, and pharynx: Secondary | ICD-10-CM | POA: Diagnosis not present

## 2017-07-12 LAB — CBC WITH DIFFERENTIAL/PLATELET
Basophils Absolute: 0 10*3/uL (ref 0–0.1)
Basophils Relative: 0 %
EOS ABS: 0.1 10*3/uL (ref 0–0.7)
Eosinophils Relative: 1 %
HEMATOCRIT: 30.3 % — AB (ref 40.0–52.0)
HEMOGLOBIN: 10.2 g/dL — AB (ref 13.0–18.0)
LYMPHS ABS: 0.8 10*3/uL — AB (ref 1.0–3.6)
LYMPHS PCT: 8 %
MCH: 28.7 pg (ref 26.0–34.0)
MCHC: 33.6 g/dL (ref 32.0–36.0)
MCV: 85.5 fL (ref 80.0–100.0)
MONOS PCT: 7 %
Monocytes Absolute: 0.6 10*3/uL (ref 0.2–1.0)
NEUTROS ABS: 7.8 10*3/uL — AB (ref 1.4–6.5)
NEUTROS PCT: 84 %
Platelets: 147 10*3/uL — ABNORMAL LOW (ref 150–440)
RBC: 3.54 MIL/uL — AB (ref 4.40–5.90)
RDW: 15.7 % — ABNORMAL HIGH (ref 11.5–14.5)
WBC: 9.3 10*3/uL (ref 3.8–10.6)

## 2017-07-12 LAB — COMPREHENSIVE METABOLIC PANEL
ALK PHOS: 48 U/L (ref 38–126)
ALT: 11 U/L — AB (ref 17–63)
ANION GAP: 8 (ref 5–15)
AST: 18 U/L (ref 15–41)
Albumin: 3.3 g/dL — ABNORMAL LOW (ref 3.5–5.0)
BILIRUBIN TOTAL: 0.5 mg/dL (ref 0.3–1.2)
BUN: 31 mg/dL — ABNORMAL HIGH (ref 6–20)
CALCIUM: 8.4 mg/dL — AB (ref 8.9–10.3)
CO2: 24 mmol/L (ref 22–32)
Chloride: 105 mmol/L (ref 101–111)
Creatinine, Ser: 1.73 mg/dL — ABNORMAL HIGH (ref 0.61–1.24)
GFR calc non Af Amer: 37 mL/min — ABNORMAL LOW (ref >60)
GFR, EST AFRICAN AMERICAN: 43 mL/min — AB (ref >60)
Glucose, Bld: 111 mg/dL — ABNORMAL HIGH (ref 65–99)
Potassium: 4.5 mmol/L (ref 3.5–5.1)
SODIUM: 137 mmol/L (ref 135–145)
Total Protein: 6.6 g/dL (ref 6.5–8.1)

## 2017-07-12 LAB — URINALYSIS, COMPLETE (UACMP) WITH MICROSCOPIC
Bilirubin Urine: NEGATIVE
GLUCOSE, UA: NEGATIVE mg/dL
KETONES UR: NEGATIVE mg/dL
Nitrite: NEGATIVE
PROTEIN: 30 mg/dL — AB
SQUAMOUS EPITHELIAL / LPF: NONE SEEN (ref 0–5)
Specific Gravity, Urine: 1.016 (ref 1.005–1.030)
pH: 6 (ref 5.0–8.0)

## 2017-07-12 LAB — APTT: APTT: 40 s — AB (ref 24–36)

## 2017-07-12 LAB — TROPONIN I: Troponin I: <0.03 ng/mL (ref <0.03)

## 2017-07-12 LAB — PROTIME-INR
INR: 1.75
Prothrombin Time: 20.3 seconds — ABNORMAL HIGH (ref 11.4–15.2)

## 2017-07-12 MED ORDER — IOPAMIDOL (ISOVUE-370) INJECTION 76%
60.0000 mL | Freq: Once | INTRAVENOUS | Status: AC | PRN
  Administered 2017-07-12: 60 mL via INTRAVENOUS

## 2017-07-12 MED ORDER — SODIUM CHLORIDE 0.9 % IV BOLUS
1000.0000 mL | Freq: Once | INTRAVENOUS | Status: AC
  Administered 2017-07-12: 1000 mL via INTRAVENOUS

## 2017-07-12 MED ORDER — LEVOFLOXACIN 750 MG PO TABS: 750.0000 mg | ORAL_TABLET | Freq: Every day | ORAL | 0 refills | Status: AC

## 2017-07-12 MED ORDER — LEVOFLOXACIN 25 MG/ML PO SOLN: 750.0000 mg | Freq: Every day | ORAL | 0 refills | Status: AC

## 2017-07-12 MED ORDER — LEVOFLOXACIN 750 MG PO TABS
750.0000 mg | ORAL_TABLET | Freq: Once | ORAL | Status: AC
  Administered 2017-07-12: 750 mg via ORAL
  Filled 2017-07-12: qty 1

## 2017-07-12 NOTE — ED Notes (Signed)
Pt reports having prostate bx last and has indwelling cath at this time. Pt reports large amount of urine output recently. Pt denies any pain currently.

## 2017-07-12 NOTE — Discharge Instructions (Addendum)
Please drink plenty of fluids to stay well-hydrated.  This will also help maintain good urine flow, and improve your abnormal kidney function.  Please make an appoint with your primary care physician to have your kidney function rechecked.  Please continue to get your follow-up care from Dr. Bernardo Heater, and continue the ciprofloxacin which has been prescribed for you.  In addition, please take the entire course of Levaquin, for the abnormalities that are seen in your lungs which may be areas of early infection.  Return to the emergency department if you develop lightheadedness or fainting, severe pain, fever, inability to keep down fluids, or any other symptoms concerning to you.

## 2017-07-12 NOTE — ED Provider Notes (Signed)
Patient's wife called the emergency department that apparently the pharmacy is unable to his stock liquid Levaquin for another 4 days.  She said that the patient should be able to swallow pill form Levaquin if she crushes it up.  I have written a prescription for Levaquin which will be left at the front desk for her to pick up.  Charge nurse notified to call the patient.   Darel Hong, MD 07/12/17 2315

## 2017-07-12 NOTE — ED Provider Notes (Addendum)
[facility] Emergency Department Provider Note  ____________________________________________  Time seen: Approximately 5:44 PM  I have reviewed the triage vital signs and the nursing notes.   HISTORY  Chief Complaint Dizziness and Atrial Fibrillation    HPI Jermaine Campbell is a 72 y.o. male with a history of A. fib on Xarelto presenting for syncope.  The patient has recently been diagnosed with prostate cancer and is currently being evaluated for treatment.  He did have an episode of urinary retention which is being treated with an indwelling Foley catheter; he recently cleared his mild blood clots in has had normal output.  Today, the patient was hitting golf balls in the heat when he began to feel lightheaded and had a brief syncopal episode without any injury.  He did not have any state of confusion and denies any associated chest pain, shortness of breath, palpitations, visual changes, numbness tingling or weakness.  He was able to get up with some assistance but did have 2 episodes of vomiting afterwards.  The patient has had syncopal episodes in the past associated with vomiting; this episode was not completely unusual for him.  He has not had any recent illness including fevers, chills, abdominal pain, diarrhea.  No recent changes in his medications.  The patient and his wife do report that he generally does not drink enough water to stay well-hydrated.  Past Medical History:  Diagnosis Date  . Anemia   . Bradycardia   . 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 Coronary artery disease    atherosclerosis of native coronary artery of native heart without angina pectoris  . Deaf    Left ear  . Dysrhythmia    paroxysmal atrial fibrillation  . H/O syncope    several yrs ago  . Hypercholesteremia   . Hypertension   . Hypothyroidism    s/p radiation tx - throat CA  . Myocardial infarction ([place])    1996   . Neck stiffness    s/p radiation tx - Throat CA  . Seizures ([place])   . Vertigo    no episodes 4-5 yrs/dizziness  . Wears hearing aid    Right ear/ deaf in left ear    Patient Active Problem List   Diagnosis Date Noted  . Elevated PSA 12/28/2016  . BPH with obstruction/lower urinary tract symptoms 12/28/2016    Past Surgical History:  Procedure Laterality Date  . BICEPT TENODESIS Right 01/26/2016   Procedure: BICEPS TENODESIS- open;  Surgeon: Corky Mull, MD;  Location: [facility] ORS;  Service: Orthopedics;  Laterality: Right;  . CARDIAC CATHETERIZATION     before CABG/ DR PARACHOS IS CARDIOLOGIST  . CATARACT EXTRACTION W/PHACO Left 07/30/2014   Procedure: CATARACT EXTRACTION PHACO AND INTRAOCULAR LENS PLACEMENT ([place]) SHUGARCAINE;  Surgeon: Leandrew Koyanagi, MD;  Location: [place];  Service: Ophthalmology;  Laterality: Left;  SHUGARCAINE  . CATARACT EXTRACTION W/PHACO Right 10/29/2014   Procedure: CATARACT EXTRACTION PHACO AND INTRAOCULAR LENS PLACEMENT ([facility]);  Surgeon: Leandrew Koyanagi, MD;  Location: [place];  Service: Ophthalmology;  Laterality: Right;  [place]  . COLONOSCOPY    . COLONOSCOPY WITH PROPOFOL N/A 04/14/2017   Procedure: COLONOSCOPY WITH PROPOFOL;  Surgeon: Manya Silvas, MD;  Location: [facility] ENDOSCOPY;  Service: Endoscopy;  Laterality: N/A;  . CORONARY ARTERY BYPASS GRAFT  1996   3 vessel  . FRACTURE SURGERY     right arm  .  OPEN SUBSCAPULARIS REPAIR Right 01/26/2016   Procedure: arthroscopic  SUBSCAPULARIS REPAIR;  Surgeon: Corky Mull, MD;  Location: [facility] ORS;  Service: Orthopedics;  Laterality: Right;  . SHOULDER ARTHROSCOPY WITH OPEN ROTATOR CUFF REPAIR Right 01/26/2016   Procedure: SHOULDER ARTHROSCOPY WITH MINI OPEN ROTATOR CUFF REPAIR;  Surgeon: Corky Mull, MD;  Location: [facility] ORS;  Service: Orthopedics;  Laterality: Right;  . SHOULDER ARTHROSCOPY WITH SUBACROMIAL DECOMPRESSION Right 01/26/2016   Procedure: SHOULDER ARTHROSCOPY  WITH SUBACROMIAL DECOMPRESSION and debridement;  Surgeon: Corky Mull, MD;  Location: [facility] ORS;  Service: Orthopedics;  Laterality: Right;  . THROAT SURGERY     excision - cancer  . TONSILLECTOMY      Current Outpatient Rx  . Order #: [id number] Class: Historical Med  . Order #: [id number] Class: Historical Med  . Order #: [id number] Class: Historical Med  . Order #: [id number] Class: Historical Med  . Order #: [id number] Class: Historical Med  . Order #: [id number] Class: Historical Med  . Order #: [id number] Class: Historical Med  . Order #: [id number] Class: Normal  . Order #: [id number] Class: Historical Med  . Order #: [id number] Class: Print    Allergies Patient has no known allergies.  Family History  Problem Relation Age of Onset  . Heart attack Father     Social History Social History   Tobacco Use  . Smoking status: Never Smoker  . Smokeless tobacco: Never Used  Substance Use Topics  . Alcohol use: No    Comment: rarely  . Drug use: No    Review of Systems Constitutional: No fever/chills.  Positive lightheadedness with syncope.  No injury. Eyes: No visual changes.  No blurred or double vision. ENT: No sore throat. No congestion or rhinorrhea. Cardiovascular: Denies chest pain. Denies palpitations. Respiratory: Denies shortness of breath.  No cough. Gastrointestinal: No abdominal pain.  Positive nausea, positive vomiting.  No diarrhea.  No constipation. Genitourinary: Indwelling Foley with normal urinary output. Musculoskeletal: Negative for back pain. Skin: Negative for rash. Neurological: Negative for headaches. No focal numbness, tingling or weakness.  No difficulty walking.    ____________________________________________   PHYSICAL EXAM:  VITAL SIGNS: ED Triage Vitals  Enc Vitals Group     BP 07/12/17 1700 136/73     Pulse Rate 07/12/17 1700 74     Resp 07/12/17 1700 18     Temp 07/12/17 1700 98.2 F (36.8 C)     Temp Source 07/12/17 1700 Oral     SpO2  07/12/17 1700 98 %     Weight 07/12/17 1701 170 lb (77.1 kg)     Height 07/12/17 1701 5\' 9"  (1.753 m)     Head Circumference --      Peak Flow --      Pain Score 07/12/17 1700 0     Pain Loc --      Pain Edu? --      Excl. in [place]? --     Constitutional: Alert and oriented. Well appearing and in no acute distress. Answers questions appropriately. Eyes: Conjunctivae are normal.  EOMI. PERRLA.  No scleral icterus. Head: Atraumatic. Nose: No congestion/rhinnorhea. Mouth/Throat: Mucous membranes are moist.  Neck: No stridor.  Supple.  No JVD.  No meningismus. Cardiovascular: Normal rate, irregular rhythm. No murmurs, rubs or gallops.  Respiratory: Normal respiratory effort.  No accessory muscle use or retractions. Lungs CTAB.  No wheezes, rales or ronchi. Gastrointestinal: Soft, nontender and nondistended.  No guarding or rebound.  No peritoneal signs. GU: No evidence of external hemorrhoids or palpable internal  hemorrhoids.  [name] stool is brown guaiac negative. Musculoskeletal: No LE edema. No ttp in the calves or palpable cords.  Negative Homan's sign. Neurologic:  A&Ox3.  Speech is clear.  Face and smile are symmetric.  EOMI. PERRLA.  No horizontal or vertical nystagmus moves all extremities well. Skin:  Skin is warm, dry and intact. No rash noted. Psychiatric: Mood and affect are normal. Speech and behavior are normal.  Normal judgement.  ____________________________________________   LABS (all labs ordered are listed, but only abnormal results are displayed)  Labs Reviewed  CBC WITH DIFFERENTIAL/PLATELET - Abnormal; Notable for the following components:      Result Value   RBC 3.54 (*)    Hemoglobin 10.2 (*)    HCT 30.3 (*)    RDW 15.7 (*)    Platelets 147 (*)    Neutro Abs 7.8 (*)    Lymphs Abs 0.8 (*)    All other components within normal limits  COMPREHENSIVE METABOLIC PANEL - Abnormal; Notable for the following components:   Glucose, Bld 111 (*)    BUN 31 (*)     Creatinine, Ser 1.73 (*)    Calcium 8.4 (*)    Albumin 3.3 (*)    ALT 11 (*)    GFR calc non Af Amer 37 (*)    GFR calc Af Amer 43 (*)    All other components within normal limits  PROTIME-INR - Abnormal; Notable for the following components:   Prothrombin Time 20.3 (*)    All other components within normal limits  APTT - Abnormal; Notable for the following components:   aPTT 40 (*)    All other components within normal limits  URINALYSIS, COMPLETE (UACMP) WITH MICROSCOPIC - Abnormal; Notable for the following components:   Color, Urine YELLOW (*)    APPearance CLEAR (*)    Hgb urine dipstick MODERATE (*)    Protein, ur 30 (*)    Leukocytes, UA TRACE (*)    RBC / HPF >50 (*)    Bacteria, UA RARE (*)    All other components within normal limits  URINE CULTURE  TROPONIN I  TROPONIN I   ____________________________________________  EKG  ED ECG REPORT I, Eula Listen, the attending physician, personally viewed and interpreted this ECG.   Date: 07/12/2017  EKG Time: 1656  Rate: 68  Rhythm: normal sinus rhythm  Axis: normal  Intervals:borderline Qtc  ST&T Change: No STEMI  ____________________________________________  RADIOLOGY  Ct Angio Chest Pe W And/or Wo Contrast  Result Date: 07/12/2017 CLINICAL DATA:  Dizziness and near syncope while playing golf. Atrial fibrillation. EXAM: CT ANGIOGRAPHY CHEST WITH CONTRAST TECHNIQUE: Multidetector CT imaging of the chest was performed using the standard protocol during bolus administration of intravenous contrast. Multiplanar CT image reconstructions and MIPs were obtained to evaluate the vascular anatomy. CONTRAST:  52mL ISOVUE-370 IOPAMIDOL (ISOVUE-370) INJECTION 76% COMPARISON:  None. FINDINGS: Cardiovascular: Satisfactory opacification of the pulmonary arteries to the segmental level. No evidence of pulmonary embolism. Mild cardiomegaly. Prior CABG. No pericardial effusion. Normal caliber thoracic aorta. Coronary,  aortic arch, and branch vessel atherosclerotic vascular disease. Mediastinum/Nodes: No enlarged mediastinal, hilar, or axillary lymph nodes. Thyroid gland, trachea, and esophagus demonstrate no significant findings. Lungs/Pleura: Patchy consolidation within the left lower lobe. Faint ground-glass densities in the right lower and middle lobes. Subsegmental atelectasis in the right lower lobe. No pleural effusion or pneumothorax. No suspicious pulmonary nodule. Upper Abdomen: No acute abnormality.  Small hiatal hernia. Musculoskeletal: No chest wall abnormality. No  acute or significant osseous findings. Review of the MIP images confirms the above findings. IMPRESSION: 1.  No evidence of pulmonary embolism. 2. Patchy consolidation within the left lower lobe with milder ground-glass density in the right lower and middle lobes. Findings may reflect aspiration pneumonia given clinical history of vomiting. 3.  Aortic atherosclerosis (ICD10-I70.0). Electronically Signed   By: Titus Dubin M.D.   On: 07/12/2017 18:21    ____________________________________________   PROCEDURES  Procedure(s) performed: None  Procedures  Critical Care performed: No ____________________________________________   INITIAL IMPRESSION / ASSESSMENT AND PLAN / ED COURSE  Pertinent labs & imaging results that were available during my care of the patient were reviewed by me and considered in my medical decision making (see chart for details).  72 y.o. male with a history of A. fib on Xarelto presenting with syncopal episode in the setting of being out in the heat and self-reported dehydration.  Overall, the patient is hemodynamically stable.  He has no focal neurologic deficits which would be consistent with a CVA.  He has not been having any chest pain and ACS or MI is unlikely although we will get 2 troponins.  [name] EKG does not show ischemic changes.  Intermittent arrhythmia is also possible but I am not seeing any  evidence of rapid ventricular rate or other arrhythmia at this time.  We will also check the patient for urinary tract infection given his indwelling Foley catheter.  Electrolytes and blood counts will also be checked.  PE is considered, especially given his recent cancer diagnosis, and a CT scan has been ordered.  Orthostatic vital signs will be obtained and the patient will be given intravenous fluids.  Plan reevaluation for final disposition.  ----------------------------------------- 6:57 PM on 07/12/2017 -----------------------------------------  The patient continues to feel well and has no acute symptoms.  His chemistry does show an acute on chronic renal insufficiency which is likely due to dehydration.  In addition, he was orthostatic on examination and is receiving intravenous fluids.  His CBC did show a hemoglobin of 10.2 which is down by 2 points from 2017; given that he is on Xarelto I did perform a GU exam which did not show any evidence of GI bleed.  It is possible he has had chronic blood loss, or that his blood counts have decreased from the blood clots he initially had in his leg bag.  [name] urinalysis, troponin are pending.  His CT of the chest does not show any PE.  He does have multiple areas that look like possible aspiration pneumonia and he and his wife both report that he has chronic aspiration.  I will plan to put him on a 7-day course of Levaquin, starting with his first dose now.  ----------------------------------------- 7:49 PM on 07/12/2017 -----------------------------------------  [name] urine does show some rare bacteria but no significant number of white blood cells.  He has trace leukocyte Estrace but no nitrites.  Given that he has an indwelling Foley catheter, these changes may be within normal limits.  A culture has been sent.  The patient will continue his ciprofloxacin prescribed by his urologist, Dr. Bernardo Heater.  He will also be started on Levaquin  for his pneumonia, which will give him double coverage.  [name] repeat troponin is negative.  At this time, we will plan to discharge the patient home.  He will follow-up with his primary care physician.  Return precautions were discussed with the patient and his wife.  ____________________________________________  FINAL CLINICAL IMPRESSION(S) / ED DIAGNOSES  Final diagnoses:  Syncope, unspecified syncope type  Aspiration pneumonia of both lungs due to gastric secretions, unspecified part of lung ([place])  Acute on chronic renal insufficiency         NEW MEDICATIONS STARTED DURING THIS VISIT:  New Prescriptions   LEVOFLOXACIN (LEVAQUIN) 750 MG TABLET    Take 1 tablet (750 mg total) by mouth daily for 7 days.      Eula Listen, MD 07/12/17 1756    Eula Listen, MD 07/12/17 1900    Eula Listen, MD 07/12/17 1956

## 2017-07-12 NOTE — ED Triage Notes (Addendum)
Brought in by [facility] for dizziness while playing golf today. EMS was called and pt had 1 episode of vomiting and was in Afib. Pt converted out of Afib but now in irregular rhythm per EMS. Upon standing for EMS pt had near syncopal episode. On blood thinners and hx of Afib. Denies LOC. Received 513mL NS and 4mg  zofran IV by EMS. Pt CAOx4 and denies complaints currently.

## 2017-07-13 ENCOUNTER — Telehealth: Payer: Self-pay | Admitting: Urology

## 2017-07-13 NOTE — Telephone Encounter (Signed)
Referral faxed today   [name]

## 2017-07-14 LAB — URINE CULTURE: Culture: 20000 — AB

## 2017-07-17 ENCOUNTER — Emergency Department
Admission: EM | Admit: 2017-07-17 | Discharge: 2017-07-17 | Disposition: A | Discharge status: Home / Self Care | Payer: Medicare Other | Attending: Emergency Medicine | Admitting: Emergency Medicine

## 2017-07-17 ENCOUNTER — Other Ambulatory Visit: Payer: Self-pay

## 2017-07-17 ENCOUNTER — Ambulatory Visit (INDEPENDENT_AMBULATORY_CARE_PROVIDER_SITE_OTHER): Payer: Medicare Other

## 2017-07-17 ENCOUNTER — Encounter: Payer: Self-pay | Admitting: Emergency Medicine

## 2017-07-17 DIAGNOSIS — R42 Dizziness and giddiness: Secondary | ICD-10-CM | POA: Diagnosis present

## 2017-07-17 DIAGNOSIS — I10 Essential (primary) hypertension: Secondary | ICD-10-CM | POA: Diagnosis not present

## 2017-07-17 DIAGNOSIS — I2510 Atherosclerotic heart disease of native coronary artery without angina pectoris: Secondary | ICD-10-CM | POA: Diagnosis not present

## 2017-07-17 DIAGNOSIS — N401 Enlarged prostate with lower urinary tract symptoms: Secondary | ICD-10-CM | POA: Diagnosis not present

## 2017-07-17 DIAGNOSIS — N138 Other obstructive and reflux uropathy: Secondary | ICD-10-CM | POA: Diagnosis not present

## 2017-07-17 DIAGNOSIS — Z79899 Other long term (current) drug therapy: Secondary | ICD-10-CM | POA: Insufficient documentation

## 2017-07-17 DIAGNOSIS — I252 Old myocardial infarction: Secondary | ICD-10-CM | POA: Diagnosis not present

## 2017-07-17 DIAGNOSIS — R339 Retention of urine, unspecified: Secondary | ICD-10-CM | POA: Diagnosis not present

## 2017-07-17 DIAGNOSIS — E039 Hypothyroidism, unspecified: Secondary | ICD-10-CM | POA: Diagnosis not present

## 2017-07-17 DIAGNOSIS — Z85818 Personal history of malignant neoplasm of other sites of lip, oral cavity, and pharynx: Secondary | ICD-10-CM | POA: Insufficient documentation

## 2017-07-17 LAB — CBC
HCT: 29.2 % — ABNORMAL LOW (ref 40.0–52.0)
Hemoglobin: 9.7 g/dL — ABNORMAL LOW (ref 13.0–18.0)
MCH: 28.4 pg (ref 26.0–34.0)
MCHC: 33.3 g/dL (ref 32.0–36.0)
MCV: 85.1 fL (ref 80.0–100.0)
PLATELETS: 121 10*3/uL — AB (ref 150–440)
RBC: 3.43 MIL/uL — AB (ref 4.40–5.90)
RDW: 15.8 % — ABNORMAL HIGH (ref 11.5–14.5)
WBC: 6.6 10*3/uL (ref 3.8–10.6)

## 2017-07-17 LAB — TROPONIN I

## 2017-07-17 LAB — BASIC METABOLIC PANEL
Anion gap: 8 (ref 5–15)
BUN: 27 mg/dL — ABNORMAL HIGH (ref 6–20)
CALCIUM: 8.4 mg/dL — AB (ref 8.9–10.3)
CO2: 25 mmol/L (ref 22–32)
CREATININE: 1.46 mg/dL — AB (ref 0.61–1.24)
Chloride: 102 mmol/L (ref 101–111)
GFR calc non Af Amer: 46 mL/min — ABNORMAL LOW (ref >60)
GFR, EST AFRICAN AMERICAN: 53 mL/min — AB (ref >60)
GLUCOSE: 131 mg/dL — AB (ref 65–99)
Potassium: 4.2 mmol/L (ref 3.5–5.1)
Sodium: 135 mmol/L (ref 135–145)

## 2017-07-17 NOTE — ED Provider Notes (Signed)
[facility] Emergency Department Provider Note   ____________________________________________   I have reviewed the triage vital signs and the nursing notes.   HISTORY  Chief Complaint Dizziness   History limited by: Not Limited   HPI Jermaine Campbell is a 72 y.o. male who presents to the emergency department today because of concerns for continued dizziness.  Patient states his symptoms have been going on for slightly over 1 week.  He describes intermittent episodes of feeling lightheaded.  He denies that he feels like the room spinning around.  It is not constant.  He was seen in the emergency department for this last week he was found to be somewhat dehydrated and orthostatic.  States that since that time he has been attempting to keep good p.o. intake.  He denies any chest pain or shortness of breath.  Denies any palpitations. Denies any fevers.    Per medical record review patient has a history of recent ER visit.   Past Medical History:  Diagnosis Date  . Anemia   . Bradycardia   . 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 Coronary artery disease    atherosclerosis of native coronary artery of native heart without angina pectoris  . Deaf    Left ear  . Dysrhythmia    paroxysmal atrial fibrillation  . H/O syncope    several yrs ago  . Hypercholesteremia   . Hypertension   . Hypothyroidism    s/p radiation tx - throat CA  . Myocardial infarction ([place])    1996  . Neck stiffness    s/p radiation tx - Throat CA  . Seizures ([place])   . Vertigo    no episodes 4-5 yrs/dizziness  . Wears hearing aid    Right ear/ deaf in left ear    Patient Active Problem List   Diagnosis Date Noted  . Elevated PSA 12/28/2016  . BPH with obstruction/lower urinary tract symptoms 12/28/2016    Past Surgical History:  Procedure Laterality Date  . BICEPT TENODESIS Right 01/26/2016   Procedure: BICEPS  TENODESIS- open;  Surgeon: Corky Mull, MD;  Location: [facility] ORS;  Service: Orthopedics;  Laterality: Right;  . CARDIAC CATHETERIZATION     before CABG/ DR PARACHOS IS CARDIOLOGIST  . CATARACT EXTRACTION W/PHACO Left 07/30/2014   Procedure: CATARACT EXTRACTION PHACO AND INTRAOCULAR LENS PLACEMENT ([place]) SHUGARCAINE;  Surgeon: Leandrew Koyanagi, MD;  Location: [place];  Service: Ophthalmology;  Laterality: Left;  SHUGARCAINE  . CATARACT EXTRACTION W/PHACO Right 10/29/2014   Procedure: CATARACT EXTRACTION PHACO AND INTRAOCULAR LENS PLACEMENT ([facility]);  Surgeon: Leandrew Koyanagi, MD;  Location: [place];  Service: Ophthalmology;  Laterality: Right;  [place]  . COLONOSCOPY    . COLONOSCOPY WITH PROPOFOL N/A 04/14/2017   Procedure: COLONOSCOPY WITH PROPOFOL;  Surgeon: Manya Silvas, MD;  Location: [facility] ENDOSCOPY;  Service: Endoscopy;  Laterality: N/A;  . CORONARY ARTERY BYPASS GRAFT  1996   3 vessel  . FRACTURE SURGERY     right arm  . OPEN SUBSCAPULARIS REPAIR Right 01/26/2016   Procedure: arthroscopic  SUBSCAPULARIS REPAIR;  Surgeon: Corky Mull, MD;  Location: [facility] ORS;  Service: Orthopedics;  Laterality: Right;  . SHOULDER ARTHROSCOPY WITH OPEN ROTATOR CUFF REPAIR Right 01/26/2016   Procedure: SHOULDER ARTHROSCOPY WITH MINI OPEN ROTATOR CUFF REPAIR;  Surgeon: Corky Mull, MD;  Location: [facility] ORS;  Service: Orthopedics;  Laterality: Right;  . SHOULDER  ARTHROSCOPY WITH SUBACROMIAL DECOMPRESSION Right 01/26/2016   Procedure: SHOULDER ARTHROSCOPY WITH SUBACROMIAL DECOMPRESSION and debridement;  Surgeon: Corky Mull, MD;  Location: [facility] ORS;  Service: Orthopedics;  Laterality: Right;  . THROAT SURGERY     excision - cancer  . TONSILLECTOMY      Prior to Admission medications   Medication Sig Start Date End Date Taking? Authorizing Provider  ciprofloxacin (CIPRO) 500 MG tablet Take 500 mg by mouth 2 (two) times daily.     [provider]  ibuprofen  (ADVIL,MOTRIN) 200 MG tablet Take 200 mg by mouth every 6 (six) hours as needed.    [provider]  levofloxacin (LEVAQUIN) 25 MG/ML solution Take 30 mLs (750 mg total) by mouth daily for 6 days. 07/12/17 07/18/17  Eula Listen, MD  levofloxacin (LEVAQUIN) 750 MG tablet Take 1 tablet (750 mg total) by mouth daily for 7 days. 07/12/17 07/19/17  Eula Listen, MD  levothyroxine (SYNTHROID, LEVOTHROID) 100 MCG tablet Take 100 mcg by mouth every morning.     [provider]  losartan (COZAAR) 25 MG tablet Take 25 mg by mouth every evening.     [provider]  metoprolol tartrate (LOPRESSOR) 25 MG tablet Take 12.5 mg by mouth 2 (two) times daily.     [provider]  rivaroxaban (XARELTO) 20 MG TABS tablet Take 20 mg by mouth every evening.     [provider]  simvastatin (ZOCOR) 40 MG tablet Take 40 mg by mouth daily.     [provider]  tamsulosin (FLOMAX) 0.4 MG CAPS capsule Take 1 capsule (0.4 mg total) by mouth daily. 07/10/17   Stoioff, Ronda Fairly, MD  vitamin B-12 (CYANOCOBALAMIN) 1000 MCG tablet Take 1,000 mcg by mouth daily.    [provider]    Allergies Patient has no known allergies.  Family History  Problem Relation Age of Onset  . Heart attack Father     Social History Social History   Tobacco Use  . Smoking status: Never Smoker  . Smokeless tobacco: Never Used  Substance Use Topics  . Alcohol use: No    Comment: rarely  . Drug use: No    Review of Systems Constitutional: No fever/chills. Positive for dizziness. Eyes: No visual changes. ENT: No sore throat. Cardiovascular: Denies chest pain. Respiratory: Denies shortness of breath. Gastrointestinal: No abdominal pain.  No nausea, no vomiting.  No diarrhea.   Genitourinary: Negative for dysuria. Musculoskeletal: Negative for back pain. Skin: Negative for rash. Neurological: Negative for headaches, focal weakness or  numbness.  ____________________________________________   PHYSICAL EXAM:  VITAL SIGNS: ED Triage Vitals  Enc Vitals Group     BP 07/17/17 1657 92/68     Pulse Rate 07/17/17 1657 72     Resp 07/17/17 1656 16     Temp 07/17/17 1656 98.2 F (36.8 C)     Temp Source 07/17/17 1656 Oral     SpO2 07/17/17 1656 98 %     Weight 07/17/17 1656 170 lb (77.1 kg)     Height 07/17/17 1656 5\' 9"  (1.753 m)     Head Circumference --      Peak Flow --      Pain Score 07/17/17 1656 0   Constitutional: Alert and oriented.  Eyes: Conjunctivae are normal.  ENT      Head: Normocephalic and atraumatic.      Nose: No congestion/rhinnorhea.      Mouth/Throat: Mucous membranes are moist.      Neck:  No stridor. Hematological/Lymphatic/Immunilogical: No cervical lymphadenopathy. Cardiovascular: Normal rate, regular rhythm.  No murmurs, rubs, or gallops.  Respiratory: Normal respiratory effort without tachypnea nor retractions. Breath sounds are clear and equal bilaterally. No wheezes/rales/rhonchi. Gastrointestinal: Soft and non tender. No rebound. No guarding.  Genitourinary: Deferred Musculoskeletal: Normal range of motion in all extremities. No lower extremity edema. Neurologic:  Normal speech and language. No gross focal neurologic deficits are appreciated.  Skin:  Skin is warm, dry and intact. No rash noted. Psychiatric: Mood and affect are normal. Speech and behavior are normal. Patient exhibits appropriate insight and judgment.  ____________________________________________    LABS (pertinent positives/negatives)  CBC wbc 6.6, hgb 9.7, plt 121 BMP na 135, k 4.2, glu 131, cr 1.46 Trop <0.03 ____________________________________________   EKG  I, Nance Pear, attending physician, personally viewed and interpreted this EKG  EKG Time: 1654 Rate: 65 Rhythm: normal sinus rhythm Axis: normal Intervals: qtc 434 QRS: narrrow ST changes: no st elevation Impression: normal  ekg   ____________________________________________    RADIOLOGY  None  ____________________________________________   PROCEDURES  Procedures  ____________________________________________   INITIAL IMPRESSION / ASSESSMENT AND PLAN / ED COURSE  Pertinent labs & imaging results that were available during my care of the patient were reviewed by me and considered in my medical decision making (see chart for details).   Patient presents to the emergency department today with continued episodes of lightheadedness and weakness.  Differential would be broad including infection, anemia, electrolyte abnormalities.  Patient was evaluated for the symptoms recently in the emergency department.  Work-up today without concerning findings.  Patient is not orthostatic.  At this point unclear etiology however do feel is safe for patient to continue work-up as an outpatient.   ____________________________________________   FINAL CLINICAL IMPRESSION(S) / ED DIAGNOSES  Final diagnoses:  Lightheadedness     Note: This dictation was prepared with Dragon dictation. Any transcriptional errors that result from this process are unintentional     Nance Pear, MD 07/17/17 2011

## 2017-07-17 NOTE — ED Notes (Signed)
Pt states he was in hospital recently for dehydration. Pt states that since then they have been monitoring I&O with urinary catheter. Pt states he doesn't understand why he is back in hospital and has said that the dizziness has not been changing. AxOx4.

## 2017-07-17 NOTE — ED Triage Notes (Addendum)
Pt to ED via POV with c/o intermit dizziness and hypotension. Pt was seen here last week for same symptoms and dx with dehydration. TP A&OX4. VSS . Stroke screen neg

## 2017-07-17 NOTE — Discharge Instructions (Addendum)
Please seek medical attention for any high fevers, chest pain, shortness of breath, change in behavior, persistent vomiting, bloody stool or any other new or concerning symptoms.  

## 2017-07-17 NOTE — Progress Notes (Signed)
Catheter Removal  Patient is present today for a catheter removal.  35ml of water was drained from the balloon. A 16FR foley cath was removed from the bladder no complications were noted . Patient tolerated well.  Preformed by: Fonnie Jarvis, CMA  Follow up/ Additional notes: afternoon PVR  Patient returned this afternoon stating he has not been able to urinate since cath removal this morning.   Bladder Scan Patient cannot void: 956 ml Performed By: Fonnie Jarvis, CMA   Continuous Intermittent Catheterization  Due to urinary retention patient is present today for a teaching of self I & O Catheterization. Patient was given detailed verbal and printed instructions of self catheterization. Patient was cleaned and prepped in a sterile fashion.  With instruction and assistance patient inserted a 14FR flex coude cath ( a coude catheter was used due to patient's history of BPH with obstruction) and urine return was noted 950 ml, urine was yellow in color. Patient tolerated well, no complications were noted Patient was given a sample bag with supplies to take home.  Instructions were given per Dr. Bernardo Heater for patient to cath 4-6 times daily.  An order was placed with [employer] for catheters to be sent to the patient's home. Patient is to follow up with a voiding diary in two days.  Preformed by: Fonnie Jarvis, CMA

## 2017-07-18 ENCOUNTER — Telehealth: Payer: Self-pay | Admitting: Radiology

## 2017-07-18 NOTE — Telephone Encounter (Signed)
Patient would like a return call from Judson Roch.  He was in the office 07/17/2017 to learn CIC.

## 2017-07-19 ENCOUNTER — Ambulatory Visit (INDEPENDENT_AMBULATORY_CARE_PROVIDER_SITE_OTHER): Payer: Medicare Other

## 2017-07-19 DIAGNOSIS — R339 Retention of urine, unspecified: Secondary | ICD-10-CM

## 2017-07-19 NOTE — Progress Notes (Signed)
Patient present today stating he has not been able to CIC since yesterday at 2pm he made several attempts to cath and was not successful and did see blood.   Simple Catheter Placement  Due to urinary retention patient is present today for a foley cath placement.  Patient was cleaned and prepped in a sterile fashion with betadine and lidocaine jelly 2% was instilled into the urethra.  A 16 coude FR foley catheter was attempted with out success and blood was noted. Per Dr. Bernardo Heater a 14 fr coude was then attempted and inserted with success, urine return was noted  2576ml, urine was cola in color.  The balloon was filled with 10cc of sterile water. Catheter was irrigated with 70cc of sterile water to make sure that no blood clots were noted. This was demonstrated for patient's Jermaine Campbell with instruction and supplies sent home for irrigation prn.  A leg bag was attached for drainage. Patient was also given a night bag to take home and was given instruction on how to change from one bag to another.  Patient was given instruction on proper catheter care.  Patient tolerated well, no complications were noted   Preformed by: Jermaine Campbell, CMA  Additional notes/ Follow up: Patient was told to stop Flomax due to dizziness and BP dropping. Patient is to keep foley in until after apt with radiation oncology per Dr. Bernardo Heater

## 2017-07-19 NOTE — Telephone Encounter (Signed)
Patient came to office in urinary retention , foley placed

## 2017-08-07 ENCOUNTER — Other Ambulatory Visit: Payer: Self-pay

## 2017-08-07 MED ORDER — TAMSULOSIN HCL 0.4 MG PO CAPS: 0.4000 mg | ORAL_CAPSULE | Freq: Every day | ORAL | 0 refills | Status: DC

## 2017-08-08 DIAGNOSIS — C61 Malignant neoplasm of prostate: Secondary | ICD-10-CM | POA: Insufficient documentation

## 2017-08-10 ENCOUNTER — Telehealth: Payer: Self-pay | Admitting: Radiology

## 2017-08-10 NOTE — Telephone Encounter (Signed)
Patient's wife states Dr Bernardo Heater was going to talk to Dr Bridgett Larsson regarding surgery before starting radiation treatments. Please advise. Will need orders.

## 2017-08-13 NOTE — Telephone Encounter (Signed)
He has 122 g prostate.  I talked to Dr. Erlene Quan about a possible HOLEP.  She was going to review the chart.

## 2017-08-15 NOTE — Telephone Encounter (Signed)
Patient's wife called regarding plan for surgery prior to starting radiation treatments. Dr Bernardo Heater was going to discuss HOLEP with Dr Erlene Quan. Please advise.

## 2017-08-15 NOTE — Telephone Encounter (Signed)
Please schedule f/u with me in near future to discuss, possibly Friday.  Hollice Espy, MD

## 2017-08-15 NOTE — Telephone Encounter (Signed)
Informed wife of appointment with Dr Erlene Quan to discuss surgery. Questions answered. Wife voices understanding.

## 2017-08-18 ENCOUNTER — Ambulatory Visit (INDEPENDENT_AMBULATORY_CARE_PROVIDER_SITE_OTHER): Payer: Medicare Other | Admitting: Urology

## 2017-08-18 ENCOUNTER — Other Ambulatory Visit
Admission: RE | Admit: 2017-08-18 | Discharge: 2017-08-18 | Disposition: A | Discharge status: Home / Self Care | Payer: Medicare Other | Source: Ambulatory Visit | Attending: Urology | Admitting: Urology

## 2017-08-18 ENCOUNTER — Ambulatory Visit: Payer: Medicare Other | Admitting: Urology

## 2017-08-18 ENCOUNTER — Encounter: Payer: Self-pay | Admitting: Urology

## 2017-08-18 VITALS — BP 138/79 | HR 53 | Wt 167.0 lb

## 2017-08-18 DIAGNOSIS — N401 Enlarged prostate with lower urinary tract symptoms: Secondary | ICD-10-CM | POA: Diagnosis present

## 2017-08-18 DIAGNOSIS — C61 Malignant neoplasm of prostate: Secondary | ICD-10-CM

## 2017-08-18 DIAGNOSIS — N138 Other obstructive and reflux uropathy: Secondary | ICD-10-CM | POA: Insufficient documentation

## 2017-08-18 DIAGNOSIS — R339 Retention of urine, unspecified: Secondary | ICD-10-CM | POA: Diagnosis not present

## 2017-08-18 DIAGNOSIS — N323 Diverticulum of bladder: Secondary | ICD-10-CM

## 2017-08-18 LAB — URINALYSIS, COMPLETE (UACMP) WITH MICROSCOPIC
Bilirubin Urine: NEGATIVE
GLUCOSE, UA: NEGATIVE mg/dL
Ketones, ur: NEGATIVE mg/dL
Nitrite: NEGATIVE
Protein, ur: 30 mg/dL — AB
SQUAMOUS EPITHELIAL / LPF: NONE SEEN (ref 0–5)
Specific Gravity, Urine: 1.015 (ref 1.005–1.030)
pH: 5.5 (ref 5.0–8.0)

## 2017-08-18 NOTE — Progress Notes (Signed)
08/18/2017 5:13 PM   Jermaine Campbell September 29, 1945 [id number]  Referring provider: Marinda Elk, MD [facility] [place] [place] [address]  Chief Complaint  Patient presents with  . Prostate Cancer    surgery discussion    HPI: 72 year old male with high risk prostate cancer as well as massive BPH who presents today to discuss possible holmium laser enucleation of the prostate.  He has a personal history of elevated PSA 7.0 and more recently ended up undergoing prostate MRI indicating a PI-RADS 4 lesion.  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  Prostate MRI showed no lymphadenopathy or evidence of extracapsular extension.  He is undergone CT abdomen pelvis with and without contrast he is now scheduled to undergo external beam radiation treatment at [facility] with Dr. Bridgett Larsson.  He reports that he has had difficulty voiding for several years but did not disclose this to anyone.  He had a weak stream with sensation of incomplete bladder emptying as well as frequency and urgency.  Following prostate biopsy, he has been florid urinary retention and required a Foley catheter.  He tried to self cath but has been unsuccessful other than that.  He is failed multiple voiding trials since being in urinary retention despite being started on Lupron.   He does have a known prominent left-sided bladder diverticulum on CT scan and thickened bladder wall.    His recent prostate size at the time of fusion biopsy on 06/2017 at 122 mL with median lobe.    PMH: Past Medical History:  Diagnosis Date  . Anemia   . Bradycardia   . 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 Coronary artery disease    atherosclerosis of native coronary artery of native heart without angina pectoris  . Deaf    Left ear  . Dysrhythmia    paroxysmal atrial fibrillation  . H/O syncope    several yrs ago  . Hypercholesteremia   . Hypertension   . Hypothyroidism    s/p radiation tx - throat CA  . Myocardial infarction ([place])    1996  . Neck stiffness    s/p radiation tx - Throat CA  . Seizures ([place])   . Vertigo    no episodes 4-5 yrs/dizziness  . Wears hearing aid    Right ear/ deaf in left ear    Surgical History: Past Surgical History:  Procedure Laterality Date  . BICEPT TENODESIS Right 01/26/2016   Procedure: BICEPS TENODESIS- open;  Surgeon: Corky Mull, MD;  Location: [facility] ORS;  Service: Orthopedics;  Laterality: Right;  . CARDIAC CATHETERIZATION     before CABG/ DR PARACHOS IS CARDIOLOGIST  . CATARACT EXTRACTION W/PHACO Left 07/30/2014   Procedure: CATARACT EXTRACTION PHACO AND INTRAOCULAR LENS PLACEMENT ([place]) SHUGARCAINE;  Surgeon: Leandrew Koyanagi, MD;  Location: [place];  Service: Ophthalmology;  Laterality: Left;  SH[place]ARCAINE  . CATARACT EXTRACTION W/PHACO Right 10/29/2014   Procedure: CATARACT EXTRACTION PHACO AND INTRAOCULAR LENS PLACEMENT ([facility]);  Surgeon: Leandrew Koyanagi, MD;  Location: [place];  Service: Ophthalmology;  Laterality: Right;  [place]  . COLONOSCOPY    . COLONOSCOPY WITH PROPOFOL N/A 04/14/2017   Procedure: COLONOSCOPY WITH PROPOFOL;  Surgeon: Manya Silvas, MD;  Location: [facility];  Service: Endoscopy;  Laterality: N/A;  . CORONARY  ARTERY BYPASS GRAFT  1996   3 vessel  . FRACTURE SURGERY     right arm  . OPEN SUBSCAPULARIS REPAIR Right 01/26/2016   Procedure: arthroscopic  SUBSCAPULARIS REPAIR;  Surgeon: Corky Mull, MD;  Location: [facility] ORS;  Service: Orthopedics;  Laterality: Right;  . SHOULDER ARTHROSCOPY WITH OPEN ROTATOR CUFF REPAIR Right 01/26/2016   Procedure: SHOULDER ARTHROSCOPY WITH MINI OPEN ROTATOR CUFF REPAIR;  Surgeon: Corky Mull, MD;  Location: [facility] ORS;  Service: Orthopedics;  Laterality: Right;  .  SHOULDER ARTHROSCOPY WITH SUBACROMIAL DECOMPRESSION Right 01/26/2016   Procedure: SHOULDER ARTHROSCOPY WITH SUBACROMIAL DECOMPRESSION and debridement;  Surgeon: Corky Mull, MD;  Location: [facility] OR[facility];  Service: Orthopedics;  Laterality: Right;  . THROAT SURGERY     excision - cancer  . TONSILLECTOMY      Home Medications:  Allergies as of 08/18/2017   No Known Allergies     Medication List        Accurate as of 08/18/17 11:59 PM. Always use your most recent med list.          bicalutamide 50 MG tablet Commonly known as:  CASODEX Take 50 mg by mouth daily.   ciprofloxacin 500 MG tablet Commonly known as:  CIPRO Take 500 mg by mouth 2 (two) times daily.   ibuprofen 200 MG tablet Commonly known as:  ADVIL,MOTRIN Take 200 mg by mouth every 6 (six) hours as needed.   leuprolide 1 MG/0.2ML injection Commonly known as:  LUPRON Inject into the skin.   levothyroxine 100 MCG tablet Commonly known as:  SYNTHROID, LEVOTHROID Take 100 mcg by mouth every morning.   losartan 25 MG tablet Commonly known as:  COZAAR Take 25 mg by mouth every evening.   metoprolol tartrate 25 MG tablet Commonly known as:  LOPRESSOR Take 12.5 mg by mouth 2 (two) times daily.   simvastatin 40 MG tablet Commonly known as:  ZOCOR Take 40 mg by mouth daily.   tamsulosin 0.4 MG Caps capsule Commonly known as:  FLOMAX Take 1 capsule (0.4 mg total) by mouth daily.   vitamin B-12 1000 MCG tablet Commonly known as:  CYANOCOBALAMIN Take 1,000 mcg by mouth daily.   XARELTO 20 MG Tabs tablet Generic drug:  rivaroxaban Take 20 mg by mouth every evening.       Allergies: No Known Allergies  Family History: Family History  Problem Relation Age of Onset  . Heart attack Father     Social History:  reports that he has never smoked. He has never used smokeless tobacco. He reports that he does not drink alcohol or use drugs.  ROS: UROLOGY Frequent Urination?: No Hard to postpone urination?:  No Burning/pain with urination?: No Get up at night to urinate?: No Leakage of urine?: No Urine stream starts and stops?: No Trouble starting stream?: No Do you have to strain to urinate?: No Blood in urine?: No Urinary tract infection?: No Sexually transmitted disease?: No Injury to kidneys or bladder?: No Painful intercourse?: No Weak stream?: No Erection problems?: No Penile pain?: No  Gastrointestinal Nausea?: No Vomiting?: No Indigestion/heartburn?: No Diarrhea?: No Constipation?: No  Constitutional Fever: No Night sweats?: No Weight loss?: Yes Fatigue?: No  Skin Skin rash/lesions?: No Itching?: No  Eyes Blurred vision?: No Double vision?: No  Ears/Nose/Throat Sore throat?: No Sinus problems?: No  Hematologic/Lymphatic Swollen glands?: No Easy bruising?: No  Cardiovascular Leg swelling?: No Chest pain?: No  Respiratory Cough?: No Shortness of breath?: No  Endocrine Excessive thirst?: No  Musculoskeletal Back pain?: No Joint pain?: No  Neurological Headaches?: No Dizziness?: Yes  Psychologic Depression?: No Anxiety?: No  Physical Exam: BP 138/79   Pulse (!) 53   Wt 167 lb (75.8 kg)   BMI 24.66 kg/m   Constitutional:  Alert and oriented, No acute distress.  Accompanied by wife today. HEENT: [place] AT, moist mucus membranes.  Trachea midline, no masses. Cardiovascular: No clubbing, cyanosis, or edema. Respiratory: Normal respiratory effort, no increased work of breathing. GI: Abdomen is soft, nontender, nondistended, no abdominal masses GU: No CVA tenderness.   Foley draining clear yellow urine. Skin: No rashes, bruises or suspicious lesions. Neurologic: Grossly intact, no focal deficits, moving all 4 extremities. Psychiatric: Normal mood and affect.  Laboratory Data: Lab Results  Component Value Date   WBC 6.6 07/17/2017   HGB 9.7 (L) 07/17/2017   HCT 29.2 (L) 07/17/2017   MCV 85.1 07/17/2017   PLT 121 (L) 07/17/2017    Lab  Results  Component Value Date   CREATININE 1.46 (H) 07/17/2017     Urinalysis    Component Value Date/Time   COLORURINE YELLOW 08/18/2017 1400   APPEARANCEUR CLEAR 08/18/2017 1400   LABSPEC 1.015 08/18/2017 1400   PHURINE 5.5 08/18/2017 1400   GLUCOSEU NEGATIVE 08/18/2017 1400   HGBUR SMALL (A) 08/18/2017 1400   BILIRUBINUR NEGATIVE 08/18/2017 1400   KETONESUR NEGATIVE 08/18/2017 1400   PROTEINUR 30 (A) 08/18/2017 1400   NITRITE NEGATIVE 08/18/2017 1400   LEUKOCYTESUR SMALL (A) 08/18/2017 1400    Lab Results  Component Value Date   BACTERIA FEW (A) 08/18/2017    Pertinent Imaging: Ct abd/ pelvis 06/2017 personally reviewed  Assessment & Plan:    1. BPH with obstruction/lower urinary tract symptoms Lengthy discussion today regarding long-standing BPH with urinary obstruction now with chronic urinary retention despite multiple failed voiding trials Based on imaging, he does have significant sequela of long-term outlet obstruction including bladder diverticulum, thickened bladder wall and massively enlarged gland We discussed that although symptoms are most likely related to outlet obstruction, he may have developed bladder decompensation as a result and may or may not be helped with an outlet procedure although likely given that he was voiding all the way up until the time of fusion biopsy Given the massive size of his gland, 122 cc with a median lobe component, he would be best served with the procedure including holmium laser enucleation of the prostate, simple prostatectomy or robotic simple prostatectomy.  Given his upcoming radiation, a minimally invasive procedure such as holep would help expedite his treatment and recovery.  Patient today in detail including risk of bleeding, infection, damage to surrounding structures, permanent retrograde ejaculation, worsening of irritative voiding symptoms including frequency, urgency, and urge incontinence, stress urinary incontinence,  here to resolve his incontinence for his.  We discussed the preoperative, intraoperative, and postoperative course.  If his urine remains clear, he may go home with a Foley catheter.  Few days.  Given his personal history of high risk if the surgical plans are distorted, we will convert to channel TURP.  The patient is aware of this.  All questions were answered.  He is currently on Eliquis and previously received clearance to hold his anticoagulation for prostate biopsy.  We will use the same clearance to have him stop his anticoagulation for surgery.  Preop UA/urine culture.  - Urinalysis, Complete w Microscopic; Future - Urine Culture; Future  2. Urinary retention Currently managed with indwelling Foley catheter As above  3. 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

## 2017-08-18 NOTE — H&P (View-Only) (Signed)
08/18/2017 5:13 PM   Jermaine Campbell 21-Jun-1945 [id number]  Referring provider: Marinda Elk, MD [facility] [place] [address]  Chief Complaint  Patient presents with  . Prostate Cancer    surgery discussion    HPI: 72 year old male with high risk prostate cancer as well as massive BPH who presents today to discuss possible holmium laser enucleation of the prostate.  He has a personal history of elevated PSA 7.0 and more recently ended up undergoing prostate MRI indicating a PI-RADS 4 lesion.  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  Prostate MRI showed no lymphadenopathy or evidence of extracapsular extension.  He is undergone CT abdomen pelvis with and without contrast he is now scheduled to undergo external beam radiation treatment at [facility] with Dr. Bridgett Larsson.  He reports that he has had difficulty voiding for several years but did not disclose this to anyone.  He had a weak stream with sensation of incomplete bladder emptying as well as frequency and urgency.  Following prostate biopsy, he has been florid urinary retention and required a Foley catheter.  He tried to self cath but has been unsuccessful other than that.  He is failed multiple voiding trials since being in urinary retention despite being started on Lupron.   He does have a known prominent left-sided bladder diverticulum on CT scan and thickened bladder wall.    His recent prostate size at the time of fusion biopsy on 06/2017 at 122 mL with median lobe.    PMH: Past Medical History:  Diagnosis Date  . Anemia   . Bradycardia   . 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 Coronary artery disease    atherosclerosis of native coronary artery of native heart without angina pectoris  . Deaf    Left ear  . Dysrhythmia    paroxysmal atrial fibrillation  . H/O syncope    several yrs ago  . Hypercholesteremia   . Hypertension   . Hypothyroidism    s/p radiation tx - throat CA  . Myocardial infarction ([place])    1996  . Neck stiffness    s/p radiation tx - Throat CA  . Seizures ([place])   . Vertigo    no episodes 4-5 yrs/dizziness  . Wears hearing aid    Right ear/ deaf in left ear    Surgical History: Past Surgical History:  Procedure Laterality Date  . BICEPT TENODESIS Right 01/26/2016   Procedure: BICEPS TENODESIS- open;  Surgeon: Corky Mull, MD;  Location: [facility] ORS;  Service: Orthopedics;  Laterality: Right;  . CARDIAC CATHETERIZATION     before CABG/ DR PARACHOS IS CARDIOLOGIST  . CATARACT EXTRACTION W/PHACO Left 07/30/2014   Procedure: CATARACT EXTRACTION PHACO AND INTRAOCULAR LENS PLACEMENT ([place]) SHUGARCAINE;  Surgeon: Leandrew Koyanagi, MD;  Location: [place];  Service: Ophthalmology;  Laterality: Left;  SHUGARCAINE  . CATARACT EXTRACTION W/PHACO Right 10/29/2014   Procedure: CATARACT EXTRACTION PHACO AND INTRAOCULAR LENS PLACEMENT ([facility]);  Surgeon: Leandrew Koyanagi, MD;  Location: [place];  Service: Ophthalmology;  Laterality: Right;  [place]  . COLONOSCOPY    . COLONOSCOPY WITH PROPOFOL N/A 04/14/2017   Procedure: COLONOSCOPY WITH PROPOFOL;  Surgeon: Manya Silvas, MD;  Location: [facility] ENDOSCOPY;  Service: Endoscopy;  Laterality: N/A;  . CORONARY  ARTERY BYPASS GRAFT  1996   3 vessel  . FRACTURE SURGERY     right arm  . OPEN SUBSCAPULARIS REPAIR Right 01/26/2016   Procedure: arthroscopic  SUBSCAPULARIS REPAIR;  Surgeon: Corky Mull, MD;  Location: [facility] ORS;  Service: Orthopedics;  Laterality: Right;  . SHOULDER ARTHROSCOPY WITH OPEN ROTATOR CUFF REPAIR Right 01/26/2016   Procedure: SHOULDER ARTHROSCOPY WITH MINI OPEN ROTATOR CUFF REPAIR;  Surgeon: Corky Mull, MD;  Location: [facility] ORS;  Service: Orthopedics;  Laterality: Right;  .  SHOULDER ARTHROSCOPY WITH SUBACROMIAL DECOMPRESSION Right 01/26/2016   Procedure: SHOULDER ARTHROSCOPY WITH SUBACROMIAL DECOMPRESSION and debridement;  Surgeon: Corky Mull, MD;  Location: [facility] ORS;  Service: Orthopedics;  Laterality: Right;  . THROAT SURGERY     excision - cancer  . TONSILLECTOMY      Home Medications:  Allergies as of 08/18/2017   No Known Allergies     Medication List        Accurate as of 08/18/17 11:59 PM. Always use your most recent med list.          bicalutamide 50 MG tablet Commonly known as:  CASODEX Take 50 mg by mouth daily.   ciprofloxacin 500 MG tablet Commonly known as:  CIPRO Take 500 mg by mouth 2 (two) times daily.   ibuprofen 200 MG tablet Commonly known as:  ADVIL,MOTRIN Take 200 mg by mouth every 6 (six) hours as needed.   leuprolide 1 MG/0.2ML injection Commonly known as:  LUPRON Inject into the skin.   levothyroxine 100 MCG tablet Commonly known as:  SYNTHROID, LEVOTHROID Take 100 mcg by mouth every morning.   losartan 25 MG tablet Commonly known as:  COZAAR Take 25 mg by mouth every evening.   metoprolol tartrate 25 MG tablet Commonly known as:  LOPRESSOR Take 12.5 mg by mouth 2 (two) times daily.   simvastatin 40 MG tablet Commonly known as:  ZOCOR Take 40 mg by mouth daily.   tamsulosin 0.4 MG Caps capsule Commonly known as:  FLOMAX Take 1 capsule (0.4 mg total) by mouth daily.   vitamin B-12 1000 MCG tablet Commonly known as:  CYANOCOBALAMIN Take 1,000 mcg by mouth daily.   XARELTO 20 MG Tabs tablet Generic drug:  rivaroxaban Take 20 mg by mouth every evening.       Allergies: No Known Allergies  Family History: Family History  Problem Relation Age of Onset  . Heart attack Father     Social History:  reports that he has never smoked. He has never used smokeless tobacco. He reports that he does not drink alcohol or use drugs.  ROS: UROLOGY Frequent Urination?: No Hard to postpone urination?:  No Burning/pain with urination?: No Get up at night to urinate?: No Leakage of urine?: No Urine stream starts and stops?: No Trouble starting stream?: No Do you have to strain to urinate?: No Blood in urine?: No Urinary tract infection?: No Sexually transmitted disease?: No Injury to kidneys or bladder?: No Painful intercourse?: No Weak stream?: No Erection problems?: No Penile pain?: No  Gastrointestinal Nausea?: No Vomiting?: No Indigestion/heartburn?: No Diarrhea?: No Constipation?: No  Constitutional Fever: No Night sweats?: No Weight loss?: Yes Fatigue?: No  Skin Skin rash/lesions?: No Itching?: No  Eyes Blurred vision?: No Double vision?: No  Ears/Nose/Throat Sore throat?: No Sinus problems?: No  Hematologic/Lymphatic Swollen glands?: No Easy bruising?: No  Cardiovascular Leg swelling?: No Chest pain?: No  Respiratory Cough?: No Shortness of breath?: No  Endocrine Excessive thirst?: No  Musculoskeletal Back pain?: No Joint pain?: No  Neurological Headaches?: No Dizziness?: Yes  Psychologic Depression?: No Anxiety?: No  Physical Exam: BP 138/79   Pulse (!) 53   Wt 167 lb (75.8 kg)   BMI 24.66 kg/m   Constitutional:  Alert and oriented, No acute distress.  Accompanied by wife today. HEENT: [place] AT, moist mucus membranes.  Trachea midline, no masses. Cardiovascular: No clubbing, cyanosis, or edema. Respiratory: Normal respiratory effort, no increased work of breathing. GI: Abdomen is soft, nontender, nondistended, no abdominal masses GU: No CVA tenderness.   Foley draining clear yellow urine. Skin: No rashes, bruises or suspicious lesions. Neurologic: Grossly intact, no focal deficits, moving all 4 extremities. Psychiatric: Normal mood and affect.  Laboratory Data: Lab Results  Component Value Date   WBC 6.6 07/17/2017   HGB 9.7 (L) 07/17/2017   HCT 29.2 (L) 07/17/2017   MCV 85.1 07/17/2017   PLT 121 (L) 07/17/2017    Lab  Results  Component Value Date   CREATININE 1.46 (H) 07/17/2017     Urinalysis    Component Value Date/Time   COLORURINE YELLOW 08/18/2017 1400   APPEARANCEUR CLEAR 08/18/2017 1400   LABSPEC 1.015 08/18/2017 1400   PHURINE 5.5 08/18/2017 1400   GLUCOSEU NEGATIVE 08/18/2017 1400   HGBUR SMALL (A) 08/18/2017 1400   BILIRUBINUR NEGATIVE 08/18/2017 1400   KETONESUR NEGATIVE 08/18/2017 1400   PROTEINUR 30 (A) 08/18/2017 1400   NITRITE NEGATIVE 08/18/2017 1400   LEUKOCYTESUR SMALL (A) 08/18/2017 1400    Lab Results  Component Value Date   BACTERIA FEW (A) 08/18/2017    Pertinent Imaging: Ct abd/ pelvis 06/2017 personally reviewed  Assessment & Plan:    1. BPH with obstruction/lower urinary tract symptoms Lengthy discussion today regarding long-standing BPH with urinary obstruction now with chronic urinary retention despite multiple failed voiding trials Based on imaging, he does have significant sequela of long-term outlet obstruction including bladder diverticulum, thickened bladder wall and massively enlarged gland We discussed that although symptoms are most likely related to outlet obstruction, he may have developed bladder decompensation as a result and may or may not be helped with an outlet procedure although likely given that he was voiding all the way up until the time of fusion biopsy Given the massive size of his gland, 122 cc with a median lobe component, he would be best served with the procedure including holmium laser enucleation of the prostate, simple prostatectomy or robotic simple prostatectomy.  Given his upcoming radiation, a minimally invasive procedure such as holep would help expedite his treatment and recovery.  Patient today in detail including risk of bleeding, infection, damage to surrounding structures, permanent retrograde ejaculation, worsening of irritative voiding symptoms including frequency, urgency, and urge incontinence, stress urinary incontinence,  here to resolve his incontinence for his.  We discussed the preoperative, intraoperative, and postoperative course.  If his urine remains clear, he may go home with a Foley catheter.  Few days.  Given his personal history of high risk if the surgical plans are distorted, we will convert to channel TURP.  The patient is aware of this.  All questions were answered.  He is currently on Eliquis and previously received clearance to hold his anticoagulation for prostate biopsy.  We will use the same clearance to have him stop his anticoagulation for surgery.  Preop UA/urine culture.  - Urinalysis, Complete w Microscopic; Future - Urine Culture; Future  2. Urinary retention Currently managed with indwelling Foley catheter As above  3. 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

## 2017-08-19 LAB — URINE CULTURE

## 2017-08-21 ENCOUNTER — Other Ambulatory Visit: Payer: Self-pay | Admitting: Radiology

## 2017-08-21 DIAGNOSIS — N138 Other obstructive and reflux uropathy: Secondary | ICD-10-CM

## 2017-08-21 DIAGNOSIS — C61 Malignant neoplasm of prostate: Secondary | ICD-10-CM

## 2017-08-21 DIAGNOSIS — N401 Enlarged prostate with lower urinary tract symptoms: Principal | ICD-10-CM

## 2017-08-22 ENCOUNTER — Encounter
Admission: RE | Admit: 2017-08-22 | Discharge: 2017-08-22 | Disposition: A | Discharge status: Home / Self Care | Payer: Medicare Other | Source: Ambulatory Visit | Attending: Urology | Admitting: Urology

## 2017-08-22 ENCOUNTER — Other Ambulatory Visit: Payer: Self-pay

## 2017-08-22 DIAGNOSIS — Z79899 Other long term (current) drug therapy: Secondary | ICD-10-CM | POA: Diagnosis not present

## 2017-08-22 DIAGNOSIS — N401 Enlarged prostate with lower urinary tract symptoms: Secondary | ICD-10-CM | POA: Diagnosis not present

## 2017-08-22 DIAGNOSIS — N320 Bladder-neck obstruction: Secondary | ICD-10-CM | POA: Diagnosis not present

## 2017-08-22 DIAGNOSIS — I480 Paroxysmal atrial fibrillation: Secondary | ICD-10-CM | POA: Diagnosis not present

## 2017-08-22 DIAGNOSIS — Z9221 Personal history of antineoplastic chemotherapy: Secondary | ICD-10-CM | POA: Diagnosis not present

## 2017-08-22 DIAGNOSIS — C61 Malignant neoplasm of prostate: Secondary | ICD-10-CM | POA: Diagnosis present

## 2017-08-22 DIAGNOSIS — E7800 Pure hypercholesterolemia, unspecified: Secondary | ICD-10-CM | POA: Diagnosis not present

## 2017-08-22 DIAGNOSIS — I252 Old myocardial infarction: Secondary | ICD-10-CM | POA: Diagnosis not present

## 2017-08-22 DIAGNOSIS — H9192 Unspecified hearing loss, left ear: Secondary | ICD-10-CM | POA: Diagnosis not present

## 2017-08-22 DIAGNOSIS — I2510 Atherosclerotic heart disease of native coronary artery without angina pectoris: Secondary | ICD-10-CM | POA: Diagnosis not present

## 2017-08-22 DIAGNOSIS — Z7901 Long term (current) use of anticoagulants: Secondary | ICD-10-CM | POA: Diagnosis not present

## 2017-08-22 DIAGNOSIS — N138 Other obstructive and reflux uropathy: Secondary | ICD-10-CM | POA: Diagnosis not present

## 2017-08-22 DIAGNOSIS — R338 Other retention of urine: Secondary | ICD-10-CM | POA: Diagnosis not present

## 2017-08-22 DIAGNOSIS — Z8589 Personal history of malignant neoplasm of other organs and systems: Secondary | ICD-10-CM | POA: Diagnosis not present

## 2017-08-22 DIAGNOSIS — N3289 Other specified disorders of bladder: Secondary | ICD-10-CM | POA: Diagnosis not present

## 2017-08-22 DIAGNOSIS — I10 Essential (primary) hypertension: Secondary | ICD-10-CM | POA: Diagnosis not present

## 2017-08-22 DIAGNOSIS — N3080 Other cystitis without hematuria: Secondary | ICD-10-CM | POA: Diagnosis not present

## 2017-08-22 HISTORY — DX: Personal history of other diseases of male genital organs: Z87.438

## 2017-08-22 HISTORY — DX: Dysphagia, unspecified: R13.10

## 2017-08-22 HISTORY — DX: Malignant neoplasm of prostate: C61

## 2017-08-22 LAB — TYPE AND SCREEN
ABO/RH(D): O POS
Antibody Screen: NEGATIVE

## 2017-08-22 LAB — CBC
HCT: 30.1 % — ABNORMAL LOW (ref 40.0–52.0)
Hemoglobin: 10 g/dL — ABNORMAL LOW (ref 13.0–18.0)
MCH: 28.5 pg (ref 26.0–34.0)
MCHC: 33.3 g/dL (ref 32.0–36.0)
MCV: 85.5 fL (ref 80.0–100.0)
PLATELETS: 114 10*3/uL — AB (ref 150–440)
RBC: 3.53 MIL/uL — AB (ref 4.40–5.90)
RDW: 15.8 % — AB (ref 11.5–14.5)
WBC: 4.5 10*3/uL (ref 3.8–10.6)

## 2017-08-22 LAB — BASIC METABOLIC PANEL
ANION GAP: 6 (ref 5–15)
BUN: 22 mg/dL (ref 8–23)
CALCIUM: 8.7 mg/dL — AB (ref 8.9–10.3)
CO2: 28 mmol/L (ref 22–32)
CREATININE: 1.24 mg/dL (ref 0.61–1.24)
Chloride: 106 mmol/L (ref 98–111)
GFR calc non Af Amer: 56 mL/min — ABNORMAL LOW (ref >60)
Glucose, Bld: 94 mg/dL (ref 70–99)
Potassium: 4.4 mmol/L (ref 3.5–5.1)
Sodium: 140 mmol/L (ref 135–145)

## 2017-08-22 MED ORDER — CEFAZOLIN SODIUM-DEXTROSE 2-4 GM/100ML-% IV SOLN
2.0000 g | INTRAVENOUS | Status: AC
  Administered 2017-08-23: 2 g via INTRAVENOUS

## 2017-08-22 NOTE — Patient Instructions (Signed)
Your procedure is scheduled on: Wednesday, August 23, 2017  Report to [place].  DO NOT STOP ON THE FIRST FLOOR TO REGISTER  To find out your arrival time please call [phone] between 1PM - 3PM TODAY   Remember: Instructions that are not followed completely may result in serious medical risk,  up to and including death, or upon the discretion of your surgeon and anesthesiologist your  surgery may need to be rescheduled.     _X__ 1. Do not eat food after midnight the night before your procedure.                 No gum chewing or hard candies. ABSOLUTELY NOTHING SOLID IN YOUR MOUTH AFTER MIDNIGHT                  You may drink clear liquids up to 2 hours before you are scheduled to arrive for your surgery-                  DO not drink clear liquids within 2 hours of the start of your surgery.                  Clear Liquids include:  water, apple juice without pulp, clear carbohydrate                 drink such as Clearfast of Gatorade, Black Coffee or Tea (Do not add                 anything to coffee or tea).  __X__2.  On the morning of surgery brush your teeth with toothpaste and water,                     You may rinse your mouth with mouthwash if you wish.                       Do not swallow any toothpaste of mouthwash.     _X__ 3.  No Alcohol for 24 hours before or after surgery.   _X__ 4.  Do Not Smoke or use e-cigarettes For 24 Hours Prior to Your Surgery.                 Do not use any chewable tobacco products for at least 6 hours prior to                 surgery.  ____  5.  Bring all medications with you on the day of surgery if instructed.   ____  6.  Notify your doctor if there is any change in your medical condition      (cold, fever, infections).     Do not wear jewelry, make-up, hairpins, clips or nail polish. Do not wear lotions, powders, or perfumes. You may wear deodorant. Do not shave 48 hours prior to  surgery. Men may shave face and neck. Do not bring valuables to the hospital.    [facility] is not responsible for any belongings or valuables.  Contacts, dentures or bridgework may not be worn into surgery. Leave your suitcase in the car. After surgery it may be brought to your room. For patients admitted to the hospital, discharge time is determined by your treatment team.   Patients discharged the day of surgery will not be allowed to drive home.   Please read over the following fact sheets that you were given:  PREPARING FOR SURGERY    ____ Take these medicines the morning of surgery with A SIP OF WATER:    1. SYNTHROID  2. METOPROLOL  3.   4.  5.  6.  ____ Fleet Enema (as directed)   _X___ Use ANTIBACTERIAL Soap as directed  _X___ Stop ALL ASPIRIN PRODUCTS NOW  _X___ Stop Anti-inflammatories NOW               THIS INCLUDES IBUPROFEN / MOTRIN / ADVIL / ALEVE   ____ Stop supplements until after surgery.    ____ Bring C-Pap to the hospital.   [place]

## 2017-08-23 ENCOUNTER — Ambulatory Visit: Payer: Medicare Other | Admitting: Certified Registered Nurse Anesthetist

## 2017-08-23 ENCOUNTER — Encounter: Payer: Self-pay | Admitting: Emergency Medicine

## 2017-08-23 ENCOUNTER — Other Ambulatory Visit: Payer: Self-pay

## 2017-08-23 ENCOUNTER — Encounter
Admission: RE | Disposition: A | Discharge status: Home / Self Care | Payer: Self-pay | Source: Ambulatory Visit | Attending: Urology

## 2017-08-23 ENCOUNTER — Ambulatory Visit
Admission: RE | Admit: 2017-08-23 | Discharge: 2017-08-23 | Disposition: A | Discharge status: Home / Self Care | Payer: Medicare Other | Source: Ambulatory Visit | Attending: Urology | Admitting: Urology

## 2017-08-23 DIAGNOSIS — N320 Bladder-neck obstruction: Secondary | ICD-10-CM | POA: Diagnosis not present

## 2017-08-23 DIAGNOSIS — N3080 Other cystitis without hematuria: Secondary | ICD-10-CM | POA: Insufficient documentation

## 2017-08-23 DIAGNOSIS — N138 Other obstructive and reflux uropathy: Secondary | ICD-10-CM | POA: Insufficient documentation

## 2017-08-23 DIAGNOSIS — C61 Malignant neoplasm of prostate: Secondary | ICD-10-CM | POA: Diagnosis not present

## 2017-08-23 DIAGNOSIS — R338 Other retention of urine: Secondary | ICD-10-CM | POA: Diagnosis not present

## 2017-08-23 DIAGNOSIS — I480 Paroxysmal atrial fibrillation: Secondary | ICD-10-CM | POA: Insufficient documentation

## 2017-08-23 DIAGNOSIS — Z7901 Long term (current) use of anticoagulants: Secondary | ICD-10-CM | POA: Insufficient documentation

## 2017-08-23 DIAGNOSIS — Z79899 Other long term (current) drug therapy: Secondary | ICD-10-CM | POA: Insufficient documentation

## 2017-08-23 DIAGNOSIS — I252 Old myocardial infarction: Secondary | ICD-10-CM | POA: Insufficient documentation

## 2017-08-23 DIAGNOSIS — I2510 Atherosclerotic heart disease of native coronary artery without angina pectoris: Secondary | ICD-10-CM | POA: Insufficient documentation

## 2017-08-23 DIAGNOSIS — N3289 Other specified disorders of bladder: Secondary | ICD-10-CM | POA: Insufficient documentation

## 2017-08-23 DIAGNOSIS — Z8589 Personal history of malignant neoplasm of other organs and systems: Secondary | ICD-10-CM | POA: Insufficient documentation

## 2017-08-23 DIAGNOSIS — N401 Enlarged prostate with lower urinary tract symptoms: Secondary | ICD-10-CM | POA: Insufficient documentation

## 2017-08-23 DIAGNOSIS — E7800 Pure hypercholesterolemia, unspecified: Secondary | ICD-10-CM | POA: Insufficient documentation

## 2017-08-23 DIAGNOSIS — H9192 Unspecified hearing loss, left ear: Secondary | ICD-10-CM | POA: Insufficient documentation

## 2017-08-23 DIAGNOSIS — I10 Essential (primary) hypertension: Secondary | ICD-10-CM | POA: Insufficient documentation

## 2017-08-23 DIAGNOSIS — Z9221 Personal history of antineoplastic chemotherapy: Secondary | ICD-10-CM | POA: Insufficient documentation

## 2017-08-23 HISTORY — PX: HOLEP-LASER ENUCLEATION OF THE PROSTATE WITH MORCELLATION: SHX6641

## 2017-08-23 LAB — ABO/RH: ABO/RH(D): O POS

## 2017-08-23 SURGERY — ENUCLEATION, PROSTATE, USING LASER, WITH MORCELLATION
Anesthesia: General | Site: Prostate | Wound class: "Clean Contaminated "

## 2017-08-23 MED ORDER — FAMOTIDINE 20 MG PO TABS
20.0000 mg | ORAL_TABLET | Freq: Once | ORAL | Status: AC
  Administered 2017-08-23: 20 mg via ORAL

## 2017-08-23 MED ORDER — FAMOTIDINE 20 MG PO TABS
ORAL_TABLET | ORAL | Status: AC
  Administered 2017-08-23: 20 mg via ORAL
  Filled 2017-08-23: qty 1

## 2017-08-23 MED ORDER — ATROPINE SULFATE 0.4 MG/ML IJ SOLN
INTRAMUSCULAR | Status: DC | PRN
  Administered 2017-08-23: .2 mg via INTRAVENOUS

## 2017-08-23 MED ORDER — FUROSEMIDE 10 MG/ML IJ SOLN
INTRAMUSCULAR | Status: DC | PRN
  Administered 2017-08-23: 10 mg via INTRAMUSCULAR

## 2017-08-23 MED ORDER — DOCUSATE SODIUM 100 MG PO CAPS: 100.0000 mg | ORAL_CAPSULE | Freq: Two times a day (BID) | ORAL | 0 refills | Status: DC

## 2017-08-23 MED ORDER — FENTANYL CITRATE (PF) 100 MCG/2ML IJ SOLN
INTRAMUSCULAR | Status: DC | PRN
  Administered 2017-08-23 (×2): 50 ug via INTRAVENOUS

## 2017-08-23 MED ORDER — FUROSEMIDE 10 MG/ML IJ SOLN
INTRAMUSCULAR | Status: AC
  Filled 2017-08-23: qty 4

## 2017-08-23 MED ORDER — SUGAMMADEX SODIUM 200 MG/2ML IV SOLN
INTRAVENOUS | Status: DC | PRN
  Administered 2017-08-23: 160 mg via INTRAVENOUS

## 2017-08-23 MED ORDER — SUGAMMADEX SODIUM 200 MG/2ML IV SOLN
INTRAVENOUS | Status: AC
  Filled 2017-08-23: qty 2

## 2017-08-23 MED ORDER — SODIUM CHLORIDE FLUSH 0.9 % IV SOLN
INTRAVENOUS | Status: AC
  Filled 2017-08-23: qty 10

## 2017-08-23 MED ORDER — ONDANSETRON HCL 4 MG/2ML IJ SOLN
INTRAMUSCULAR | Status: DC | PRN
  Administered 2017-08-23: 4 mg via INTRAVENOUS

## 2017-08-23 MED ORDER — DEXAMETHASONE SODIUM PHOSPHATE 10 MG/ML IJ SOLN
INTRAMUSCULAR | Status: DC | PRN
  Administered 2017-08-23: 6 mg via INTRAVENOUS

## 2017-08-23 MED ORDER — SUCCINYLCHOLINE CHLORIDE 20 MG/ML IJ SOLN
INTRAMUSCULAR | Status: DC | PRN
  Administered 2017-08-23: 120 mg via INTRAVENOUS

## 2017-08-23 MED ORDER — PROPOFOL 10 MG/ML IV BOLUS
INTRAVENOUS | Status: DC | PRN
  Administered 2017-08-23: 150 mg via INTRAVENOUS

## 2017-08-23 MED ORDER — MIDAZOLAM HCL 5 MG/5ML IJ SOLN
INTRAMUSCULAR | Status: DC | PRN
  Administered 2017-08-23: 1 mg via INTRAVENOUS

## 2017-08-23 MED ORDER — DEXAMETHASONE SODIUM PHOSPHATE 10 MG/ML IJ SOLN
INTRAMUSCULAR | Status: AC
  Filled 2017-08-23: qty 1

## 2017-08-23 MED ORDER — PROPOFOL 10 MG/ML IV BOLUS
INTRAVENOUS | Status: AC
  Filled 2017-08-23: qty 20

## 2017-08-23 MED ORDER — LACTATED RINGERS IV SOLN
INTRAVENOUS | Status: DC
  Administered 2017-08-23 (×2): via INTRAVENOUS

## 2017-08-23 MED ORDER — ONDANSETRON HCL 4 MG/2ML IJ SOLN
INTRAMUSCULAR | Status: AC
  Filled 2017-08-23: qty 2

## 2017-08-23 MED ORDER — LIDOCAINE HCL (CARDIAC) PF 100 MG/5ML IV SOSY
PREFILLED_SYRINGE | INTRAVENOUS | Status: DC | PRN
  Administered 2017-08-23: 60 mg via INTRAVENOUS

## 2017-08-23 MED ORDER — MIDAZOLAM HCL 2 MG/2ML IJ SOLN
INTRAMUSCULAR | Status: AC
  Filled 2017-08-23: qty 2

## 2017-08-23 MED ORDER — FENTANYL CITRATE (PF) 100 MCG/2ML IJ SOLN
INTRAMUSCULAR | Status: AC
  Filled 2017-08-23: qty 2

## 2017-08-23 MED ORDER — EPHEDRINE SULFATE 50 MG/ML IJ SOLN
INTRAMUSCULAR | Status: DC | PRN
  Administered 2017-08-23 (×3): 10 mg via INTRAVENOUS
  Administered 2017-08-23: 5 mg via INTRAVENOUS
  Administered 2017-08-23: 10 mg via INTRAVENOUS

## 2017-08-23 MED ORDER — LIDOCAINE HCL (PF) 2 % IJ SOLN
INTRAMUSCULAR | Status: AC
  Filled 2017-08-23: qty 10

## 2017-08-23 MED ORDER — HYDROCODONE-ACETAMINOPHEN 5-325 MG PO TABS: 1.0000 | ORAL_TABLET | Freq: Four times a day (QID) | ORAL | 0 refills | Status: DC | PRN

## 2017-08-23 MED ORDER — CEFAZOLIN SODIUM-DEXTROSE 2-4 GM/100ML-% IV SOLN
INTRAVENOUS | Status: AC
  Filled 2017-08-23: qty 100

## 2017-08-23 MED ORDER — ROCURONIUM BROMIDE 100 MG/10ML IV SOLN
INTRAVENOUS | Status: DC | PRN
  Administered 2017-08-23: 10 mg via INTRAVENOUS
  Administered 2017-08-23: 5 mg via INTRAVENOUS
  Administered 2017-08-23: 15 mg via INTRAVENOUS
  Administered 2017-08-23: 20 mg via INTRAVENOUS
  Administered 2017-08-23 (×2): 10 mg via INTRAVENOUS

## 2017-08-23 MED ORDER — EPHEDRINE SULFATE 50 MG/ML IJ SOLN
INTRAMUSCULAR | Status: AC
  Filled 2017-08-23: qty 1

## 2017-08-23 MED ORDER — SUCCINYLCHOLINE CHLORIDE 20 MG/ML IJ SOLN
INTRAMUSCULAR | Status: AC
  Filled 2017-08-23: qty 1

## 2017-08-23 MED ORDER — ROCURONIUM BROMIDE 50 MG/5ML IV SOLN
INTRAVENOUS | Status: AC
  Filled 2017-08-23: qty 2

## 2017-08-23 SURGICAL SUPPLY — 38 items
ADAPTER IRRIG TUBE 2 SPIKE SOL (ADAPTER) ×6 IMPLANT
BAG DRAIN CYSTO-URO LG1000N (MISCELLANEOUS) ×3 IMPLANT
BAG URINE DRAINAGE (UROLOGICAL SUPPLIES) ×1 IMPLANT
BAG URO DRAIN 4000ML (MISCELLANEOUS) ×3 IMPLANT
CATH FOL 2WAY LX 20X30 (CATHETERS) IMPLANT
CATH FOL 2WAY LX 22X30 (CATHETERS) IMPLANT
CATH FOL 2WAY LX 24X30 (CATHETERS) IMPLANT
CATH FOLEY 3WAY 30CC 22FR (CATHETERS) IMPLANT
CATH URETL 5X70 OPEN END (CATHETERS) ×3 IMPLANT
CONTAINER COLLECT MORCELLATR (MISCELLANEOUS) ×2 IMPLANT
DRAPE SHEET LG 3/4 BI-LAMINATE (DRAPES) ×3 IMPLANT
DRAPE UTILITY 15X26 TOWEL STRL (DRAPES) ×3 IMPLANT
ELECT LOOP 22F BIPOLAR SML (ELECTROSURGICAL)
ELECTRODE LOOP 22F BIPOLAR SML (ELECTROSURGICAL) ×1 IMPLANT
FILTER OVERFLOW MORCELLATOR (FILTER) ×2 IMPLANT
GLOVE BIO SURGEON STRL SZ 6.5 (GLOVE) ×6 IMPLANT
GOWN STRL REUS W/ TWL LRG LVL3 (GOWN DISPOSABLE) ×4 IMPLANT
GOWN STRL REUS W/TWL LRG LVL3 (GOWN DISPOSABLE) ×2
HOLDER FOLEY CATH W/STRAP (MISCELLANEOUS) ×4 IMPLANT
KIT TURNOVER CYSTO (KITS) ×3 IMPLANT
LASER FIBER 550M SMARTSCOPE (Laser) ×3 IMPLANT
LOOP CUT BIPOLAR 24F LRG (ELECTROSURGICAL) ×1 IMPLANT
MORCELLATOR COLLECT CONTAINER (MISCELLANEOUS) ×3
MORCELLATOR OVERFLOW FILTER (FILTER) ×3
MORCELLATOR ROTATION 4.75 335 (MISCELLANEOUS) ×3 IMPLANT
PACK CYSTO AR (MISCELLANEOUS) ×3 IMPLANT
SENSORWIRE 0.038 NOT ANGLED (WIRE)
SET CYSTO W/LG BORE CLAMP LF (SET/KITS/TRAYS/PACK) IMPLANT
SET IRRIG Y TYPE TUR BLADDER L (SET/KITS/TRAYS/PACK) ×3 IMPLANT
SET IRRIGATING DISP (SET/KITS/TRAYS/PACK) ×3 IMPLANT
SLEEVE PROTECTION STRL DISP (MISCELLANEOUS) ×6 IMPLANT
SOL .9 NS 3000ML IRR  AL (IV SOLUTION) ×6
SOL .9 NS 3000ML IRR UROMATIC (IV SOLUTION) ×12 IMPLANT
SYR TOOMEY 50ML (SYRINGE) ×1 IMPLANT
SYRINGE IRR TOOMEY STRL 70CC (SYRINGE) ×3 IMPLANT
TUBE PUMP MORCELLATOR PIRANHA (TUBING) ×3 IMPLANT
WATER STERILE IRR 1000ML POUR (IV SOLUTION) ×3 IMPLANT
WIRE SENSOR 0.038 NOT ANGLED (WIRE) IMPLANT

## 2017-08-23 NOTE — Anesthesia Post-op Follow-up Note (Signed)
Anesthesia QCDR form completed.        

## 2017-08-23 NOTE — Anesthesia Procedure Notes (Addendum)
Procedure Name: Intubation Date/Time: 08/23/2017 11:12 AM Performed by: Dionne Bucy, CRNA Pre-anesthesia Checklist: Patient identified, Patient being monitored, Timeout performed, Emergency Drugs available and Suction available Patient Re-evaluated:Patient Re-evaluated prior to induction Oxygen Delivery Method: Circle system utilized Preoxygenation: Pre-oxygenation with 100% oxygen Induction Type: IV induction, Rapid sequence and Cricoid Pressure applied Ventilation: Mask ventilation without difficulty Laryngoscope Size: McGraph and 4 Grade View: Grade II Tube type: Oral Tube size: 6.5 mm Number of attempts: 1 Airway Equipment and Method: Bougie stylet and Video-laryngoscopy Placement Confirmation: ETT inserted through vocal cords under direct vision,  positive ETCO2 and breath sounds checked- equal and bilateral Secured at: 23 cm Tube secured with: Tape Dental Injury: Teeth and Oropharynx as per pre-operative assessment  Difficulty Due To: Difficulty was anticipated, Difficult Airway- due to immobile epiglottis, Difficult Airway- due to anterior larynx and Difficult Airway- due to reduced neck mobility

## 2017-08-23 NOTE — Interval H&P Note (Signed)
History and Physical Interval Note:  08/23/2017 10:41 AM  Jermaine Campbell  has presented today for surgery, with the diagnosis of prostate cancer, urinary retention  The various methods of treatment have been discussed with the patient and family. After consideration of risks, benefits and other options for treatment, the patient has consented to  Procedure(s): [place] MORCELLATION (N/A) TRANSURETHRAL RESECTION OF THE PROSTATE (CHANNEL TURP) (N/A) as a surgical intervention .  [name] history has been reviewed, patient examined, no change in status, stable for surgery.  I have reviewed the patient's chart and labs.  Questions were answered to the patient's satisfaction.    RRR CTAB  Hollice Espy

## 2017-08-23 NOTE — Anesthesia Preprocedure Evaluation (Addendum)
Anesthesia Evaluation  Patient identified by MRN, date of birth, ID band Patient awake    Reviewed: Allergy & Precautions, H&P , NPO status , Patient's Chart, lab work & pertinent test results, reviewed documented beta blocker date and time   Airway Mallampati: III  TM Distance: <3 FB Neck ROM: limited  Mouth opening: Limited Mouth Opening Comment: Pt very concerned about soar throat following intubation.  Previous hx of throat cancer and radiation.  Will proceed with GOT and fiberoptic scope.  [place]  (+) Teeth Intact   Pulmonary neg pulmonary ROS,    Pulmonary exam normal        Cardiovascular Exercise Tolerance: Good hypertension, On Medications + CAD and + Past MI  negative cardio ROS Normal cardiovascular exam+ dysrhythmias  Rhythm:regular Rate:Normal     Neuro/Psych Seizures -,  negative neurological ROS  negative psych ROS   GI/Hepatic negative GI ROS, Neg liver ROS,   Endo/Other  negative endocrine ROSHypothyroidism   Renal/GU negative Renal ROS  negative genitourinary   Musculoskeletal   Abdominal   Peds  Hematology negative hematology ROS (+) anemia ,   Anesthesia Other Findings Past Medical History: No date: Anemia No date: Bradycardia 2005: Cancer ([place])     Comment:  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               s/p chemotherapy 2019: Cancer of prostate ([place]) No date: Coronary artery disease     Comment:  atherosclerosis of native coronary artery of native               heart without angina pectoris No date: Deaf     Comment:  Left ear No date: Dysphagia     Comment:  history of d/t throat cancer No date: Dysrhythmia     Comment:  paroxysmal atrial fibrillation, bradycardia No date: H/O syncope     Comment:  several yrs ago No date: History of BPH No date: Hypercholesteremia No date: Hypertension No date: Hypothyroidism     Comment:  s/p radiation tx - throat  CA No date: Myocardial infarction ([place])     Comment:  1996 No date: Neck stiffness     Comment:  s/p radiation tx - Throat CA No date: Seizures ([place])     Comment:  last one 2 yrs ago. not medicated. d/t injury to frontal              lobe No date: Vertigo     Comment:  no episodes 4-5 yrs/dizziness No date: Wears hearing aid     Comment:  Right ear/ deaf in left ear Past Surgical History: 01/26/2016: BICEPT TENODESIS; Right     Comment:  Procedure: BICEPS TENODESIS- open;  Surgeon: Corky Mull, MD;  Location: [facility] ORS;  Service: Orthopedics;                Laterality: Right; No date: CARDIAC CATHETERIZATION     Comment:  before CABG/ DR PARACHOS IS CARDIOLOGIST 07/30/2014: CATARACT EXTRACTION W/PHACO; Left     Comment:  Procedure: CATARACT EXTRACTION PHACO AND INTRAOCULAR               LENS PLACEMENT ([place]) [place];  Surgeon: Leandrew Koyanagi, MD;  Location: [place];  Service:  Ophthalmology;  Laterality: Left;  SHUGARCAINE 10/29/2014: CATARACT EXTRACTION W/PHACO; Right     Comment:  Procedure: CATARACT EXTRACTION PHACO AND INTRAOCULAR               LENS PLACEMENT ([facility]);  Surgeon: Leandrew Koyanagi, MD;               Location: [place];  Service: Ophthalmology;                Laterality: Right;  SHUGARCAINE No date: COLONOSCOPY 04/14/2017: COLONOSCOPY WITH PROPOFOL; N/A     Comment:  Procedure: COLONOSCOPY WITH PROPOFOL;  Surgeon: Manya Silvas, MD;  [facility] ENDOSCOPY;  Service:               Endoscopy;  Laterality: N/A; 1996: CORONARY ARTERY BYPASS GRAFT     Comment:  3 vessel 2015: EYE SURGERY; Bilateral     Comment:  cataract extractions No date: FRACTURE SURGERY     Comment:  right arm. as a child 01/26/2016: OPEN SUBSCAPULARIS REPAIR; Right     Comment:  Procedure: arthroscopic  SUBSCAPULARIS REPAIR;  Surgeon:              Corky Mull, MD;  Location: [facility] ORS;  Service:                Orthopedics;  Laterality: Right; 01/26/2016: SHOULDER ARTHROSCOPY WITH OPEN ROTATOR CUFF REPAIR; Right     Comment:  Procedure: SHOULDER ARTHROSCOPY WITH MINI OPEN ROTATOR               CUFF REPAIR;  Surgeon: Corky Mull, MD;  Location: [facility]               ORS;  Service: Orthopedics;  Laterality: Right; 01/26/2016: SHOULDER ARTHROSCOPY WITH SUBACROMIAL DECOMPRESSION; Right     Comment:  Procedure: SHOULDER ARTHROSCOPY WITH SUBACROMIAL               DECOMPRESSION and debridement;  Surgeon: Corky Mull,               MD;  Location: [facility] ORS;  Service: Orthopedics;                Laterality: Right; No date: THROAT SURGERY     Comment:  excision - cancer No date: TONSILLECTOMY BMI    Body Mass Index:  24.74 kg/m     Reproductive/Obstetrics negative OB ROS                            Anesthesia Physical Anesthesia Plan  ASA: III  Anesthesia Plan: General LMA   Post-op Pain Management:    Induction:   PONV Risk Score and Plan:   Airway Management Planned:   Additional Equipment:   Intra-op Plan:   Post-operative Plan:   Informed Consent: I have reviewed the patients History and Physical, chart, labs and discussed the procedure including the risks, benefits and alternatives for the proposed anesthesia with the patient or authorized representative who has indicated his/her understanding and acceptance.   Dental Advisory Given  Plan Discussed with: CRNA  Anesthesia Plan Comments:        Anesthesia Quick Evaluation

## 2017-08-23 NOTE — Transfer of Care (Signed)
Immediate Anesthesia Transfer of Care Note  Patient: Jermaine Campbell  Procedure(s) Performed: HOLEP-LASER ENUCLEATION OF THE PROSTATE WITH MORCELLATION (N/A Prostate)  Patient Location: PACU  Anesthesia Type:General  Level of Consciousness: awake and patient cooperative  Airway & Oxygen Therapy: Patient Spontanous Breathing and Patient connected to face mask oxygen  Post-op Assessment: Report given to RN and Post -op Vital signs reviewed and stable  Post vital signs: Reviewed and stable  Last Vitals:  Vitals Value Taken Time  BP 156/89 08/23/2017  2:15 PM  Temp 36.1 C 08/23/2017  2:15 PM  Pulse 63 08/23/2017  2:17 PM  Resp 14 08/23/2017  2:17 PM  SpO2 100 % 08/23/2017  2:17 PM  Vitals shown include unvalidated device data.  Last Pain:  Vitals:   08/23/17 0938  TempSrc: Oral  PainSc: 0-No pain         Complications: No apparent anesthesia complications

## 2017-08-23 NOTE — Op Note (Signed)
Date of procedure: 08/23/17  Preoperative diagnosis:  1. Prostate cancer 2. BPH with bladder outlet obstruction 3. Urinary retention  Postoperative diagnosis:  1. Same as above   Procedure:       1. Holmium laser enucleation of the prostate with morcellation  Surgeon: Hollice Espy, MD  Anesthesia: General  Complications: None  Intraoperative findings: Large 5+ centimeters prostate with trilobar coaptation and mildly elevated bladder neck without a discrete median lobe.  Moderate trabeculation with few small diverticula appreciated.  Catheters cystitis noted on posterior bladder wall.  EBL: 100 cc  Specimens: prostate chips  Drains: 2 French two-way catheter with 30 cc balloon  Indication: LELAND RAVER is a 72 y.o. patient with high risk prostate cancer currently on Lupron scheduled to undergo IMRT who developed acute urinary retention is failed multiple voiding trials.  After reviewing the management options for treatment, he elected to proceed with the above surgical procedure(s). We have discussed the potential benefits and risks of the procedure, side effects of the proposed treatment, the likelihood of the patient achieving the goals of the procedure, and any potential problems that might occur during the procedure or recuperation. Informed consent has been obtained.  Description of procedure:  The patient was taken to the operating room and general anesthesia was induced.  The patient was placed in the dorsal lithotomy position, prepped and draped in the usual sterile fashion, and preoperative antibiotics were administered. A preoperative time-out was performed.   A 26 French resectoscope was placed using an angled blunt obturator into the bladder without difficulty.  The bladder was carefully inspected and noted to be moderately trabeculated with a few small bladder diverticula.  Of note, there is some catheter cystitis noted on the posterior bladder wall.  The trigone  was appreciated and a good distance from the bladder neck which is mildly elevated.  The prostate itself had significant trilobar coaptation, at least 5 cm in length without a discrete median lobe.  At this point in time, a 550 m laser fiber was brought in and using the settings of 1.9 J and 51 Hz, 2 incisions were created at the bladder neck at the 5:00 and 7:00 positions.  These incisions were carried down meeting in the midline just proximal to the verumontanum.  The median lobe was then enucleated in a caudal to cranial fashion separating the adenoma from the underlying capsule rolling the adenoma towards the bladder neck and ultimately cleaving the bladder neck by incising the urothelial mucosa.  This freed the adenoma into the bladder.  Next, a curvilinear incision was made at the left apex of the prostate extending circumferentially towards the bladder neck.  The left lateral lobe was then enucleated in a similar fashion from caudal to cranial direction pushing the adenoma towards the bladder neck and ultimately able to cleave the adenoma free of the bladder neck by incising the anterior commissure mucosa.  The plane was quite nice   In the adenoma separated easily from the capsule.  Finally, the same exact procedure was performed as described above for the right lateral lobe.  1-3 lobes were within the bladder, a few small nodules were enucleated from the prostatic fossa in a pea-sized fashion.  Hemostasis was fairly adequate this point in time.  The nephroscope was then brought in and using a Piranha morcellator, the bladder was distended and the tissue was morcellated with care to avoid any injury to the bladder itself.  There is a small amount of bleeding  at the bladder neck which was controlled using a bipolar loop.  Finally, once no residual chips remained, the scope was removed.  Prior to this, I did inspect the UOs which are noted to be free of any injury and a good distance from the bladder  neck.  A 22 French two-way Foley catheter was placed using a catheter guide.  Was irrigated to ensure good position.  The balloon was inflated with 30 cc of sterile water.  The patient was then clean and dry, repositioned in supine position, reversed from anesthesia, and taken to the PACU in stable condition.  Notably, the patient was administered 10 mg of IV Lasix to help facilitate diuresis.  Plan: Patient will maintain his Foley catheter for 2 days and return to our office on Friday for voiding trial.  Hollice Espy, M.D.

## 2017-08-23 NOTE — Discharge Instructions (Signed)
AMBULATORY SURGERY  DISCHARGE INSTRUCTIONS   1) The drugs that you were given will stay in your system until tomorrow so for the next 24 hours you should not:  A) Drive an automobile B) Make any legal decisions C) Drink any alcoholic beverage   2) You may resume regular meals tomorrow.  Today it is better to start with liquids and gradually work up to solid foods.  You may eat anything you prefer, but it is better to start with liquids, then soup and crackers, and gradually work up to solid foods.   3) Please notify your doctor immediately if you have any unusual bleeding, trouble breathing, redness and pain at the surgery site, drainage, fever, or pain not relieved by medication. 4)   5) Your post-operative visit with Dr.                                     is: Date:                        Time:    Please call to schedule your post-operative visit.  6) Additional Instructions:      Please continue to hold blood thinners until your urine is clear.      Indwelling Urinary Catheter Care, Adult Take good care of your catheter to keep it working and to prevent problems. How to wear your catheter Attach your catheter to your leg with tape (adhesive tape) or a leg strap. Make sure it is not too tight. If you use tape, remove any bits of tape that are already on the catheter. How to wear a drainage bag You should have:  A large overnight bag.  A small leg bag.  Overnight Bag You may wear the overnight bag at any time. Always keep the bag below the level of your bladder but off the floor. When you sleep, put a clean plastic bag in a wastebasket. Then hang the bag inside the wastebasket. Leg Bag Never wear the leg bag at night. Always wear the leg bag below your knee. Keep the leg bag secure with a leg strap or tape. How to care for your skin  Clean the skin around the catheter at least once every day.  Shower every day. Do not take baths.  Put creams, lotions, or  ointments on your genital area only as told by your doctor.  Do not use powders, sprays, or lotions on your genital area. How to clean your catheter and your skin 1. Wash your hands with soap and water. 2. Wet a washcloth in warm water and gentle (mild) soap. 3. Use the washcloth to clean the skin where the catheter enters your body. Clean downward and wipe away from the catheter in small circles. Do not wipe toward the catheter. 4. Pat the area dry with a clean towel. Make sure to clean off all soap. How to care for your drainage bags Empty your drainage bag when it is ?- full or at least 2-3 times a day. Replace your drainage bag once a month or sooner if it starts to smell bad or look dirty. Do not clean your drainage bag unless told by your doctor. Emptying a drainage bag  Supplies Needed  Rubbing alcohol.  Gauze pad or cotton ball.  Tape or a leg strap.  Steps 1. Wash your hands with soap and water. 2. Separate (detach)  the bag from your leg. 3. Hold the bag over the toilet or a clean container. Keep the bag below your hips and bladder. This stops pee (urine) from going back into the tube. 4. Open the pour spout at the bottom of the bag. 5. Empty the pee into the toilet or container. Do not let the pour spout touch any surface. 6. Put rubbing alcohol on a gauze pad or cotton ball. 7. Use the gauze pad or cotton ball to clean the pour spout. 8. Close the pour spout. 9. Attach the bag to your leg with tape or a leg strap. 10. Wash your hands.  Changing a drainage bag Supplies Needed  Alcohol wipes.  A clean drainage bag.  Adhesive tape or a leg strap.  Steps 1. Wash your hands with soap and water. 2. Separate the dirty bag from your leg. 3. Pinch the rubber catheter with your fingers so that pee does not spill out. 4. Separate the catheter tube from the drainage tube where these tubes connect (at the connection valve). Do not let the tubes touch any surface. 5. Clean  the end of the catheter tube with an alcohol wipe. Use a different alcohol wipe to clean the end of the drainage tube. 6. Connect the catheter tube to the drainage tube of the clean bag. 7. Attach the new bag to the leg with adhesive tape or a leg strap. 8. Wash your hands.  How to prevent infection and other problems  Never pull on your catheter or try to remove it. Pulling can damage tissue in your body.  Always wash your hands before and after touching your catheter.  If a leg strap gets wet, replace it with a dry one.  Drink enough fluids to keep your pee clear or pale yellow, or as told by your doctor.  Do not let the drainage bag or tubing touch the floor.  Wear cotton underwear.  If you are male, wipe from front to back after you poop (have a bowel movement).  Check on the catheter often to make sure it works and the tubing is not twisted. Get help if:  Your pee is cloudy.  Your pee smells unusually bad.  Your pee is not draining into the bag.  Your tube gets clogged.  Your catheter starts to leak.  Your bladder feels full. Get help right away if:  You have redness, swelling, or pain where the catheter enters your body.  You have fluid, pus, or a bad smell coming from the area where the catheter enters your body.  The area where the catheter enters your body feels warm.  You have a fever.  You have pain in your: ? Stomach (abdomen). ? Legs. ? Lower back. ? Bladder.  You see blood fill the catheter.  Your pee is pink or red.  You feel sick to your stomach (nauseous).  You throw up (vomit).  You have chills.  Your catheter gets pulled out. This information is not intended to replace advice given to you by your health care provider. Make sure you discuss any questions you have with your health care provider. Document Released: 05/07/2012 Document Revised: 12/09/2015 Document Reviewed: 06/25/2013 Elsevier Interactive Patient Education  [employer].

## 2017-08-25 ENCOUNTER — Ambulatory Visit (INDEPENDENT_AMBULATORY_CARE_PROVIDER_SITE_OTHER): Payer: Medicare Other

## 2017-08-25 ENCOUNTER — Other Ambulatory Visit: Payer: Self-pay

## 2017-08-25 DIAGNOSIS — R339 Retention of urine, unspecified: Secondary | ICD-10-CM

## 2017-08-25 DIAGNOSIS — N138 Other obstructive and reflux uropathy: Secondary | ICD-10-CM | POA: Diagnosis not present

## 2017-08-25 DIAGNOSIS — N401 Enlarged prostate with lower urinary tract symptoms: Secondary | ICD-10-CM

## 2017-08-25 LAB — BLADDER SCAN AMB NON-IMAGING: Scan Result: <379

## 2017-08-25 NOTE — Anesthesia Postprocedure Evaluation (Signed)
Anesthesia Post Note  Patient: Jermaine Campbell  Procedure(s) Performed: HOLEP-LASER ENUCLEATION OF THE PROSTATE WITH MORCELLATION (N/A Prostate)  Anesthesia Type: General     Last Vitals:  Vitals:   08/23/17 1459 08/23/17 1538  BP: (!) 162/82 138/66  Pulse: (!) 52 80  Resp: 16 16  Temp: (!) 36.1 C   SpO2: 97% 100%    Last Pain:  Vitals:   08/24/17 0832  TempSrc:   PainSc: 0-No pain                 Molli Barrows

## 2017-08-25 NOTE — Progress Notes (Signed)
Fill and Pull Catheter Removal  Patient is present today for a catheter removal.  Patient was cleaned and prepped in a sterile fashion 323ml of sterile water/ saline was instilled into the bladder when the patient felt the urge to urinate. 67ml of water was then drained from the balloon.  A 22FR foley cath was removed from the bladder no complications were noted .  Patient was sent home and told to return by 1430 for bladder scan.  Preformed by: Cristie Hem, CMA  Follow up/ Additional notes: Return to office at 1430 for bladder scan.   Bladder Scan Patient can void: <579 ml after pt voided in office, his pvr value was <379. Per Dr. Bernardo Heater pt does not need cath placed back in, he has a large bladder and may just not be emptying. Pt informed if he is unable to void over the weekend, he should go to ed immediately. Performed By: Cristie Hem, [place]

## 2017-08-27 LAB — SURGICAL PATHOLOGY

## 2017-11-07 ENCOUNTER — Other Ambulatory Visit: Payer: Self-pay | Admitting: Family Medicine

## 2017-11-23 DIAGNOSIS — I350 Nonrheumatic aortic (valve) stenosis: Secondary | ICD-10-CM | POA: Insufficient documentation

## 2018-01-01 ENCOUNTER — Other Ambulatory Visit: Payer: Medicare Other

## 2018-01-03 ENCOUNTER — Ambulatory Visit: Payer: Medicare Other | Admitting: Urology

## 2018-03-05 ENCOUNTER — Encounter: Payer: Medicare Other | Attending: Cardiothoracic Surgery | Admitting: *Deleted

## 2018-03-05 ENCOUNTER — Encounter: Payer: Self-pay | Admitting: *Deleted

## 2018-03-05 VITALS — Ht 69.0 in | Wt 171.1 lb

## 2018-03-05 DIAGNOSIS — Z8546 Personal history of malignant neoplasm of prostate: Secondary | ICD-10-CM | POA: Diagnosis not present

## 2018-03-05 DIAGNOSIS — Z923 Personal history of irradiation: Secondary | ICD-10-CM | POA: Diagnosis not present

## 2018-03-05 DIAGNOSIS — I252 Old myocardial infarction: Secondary | ICD-10-CM | POA: Diagnosis not present

## 2018-03-05 DIAGNOSIS — Z79899 Other long term (current) drug therapy: Secondary | ICD-10-CM | POA: Diagnosis not present

## 2018-03-05 DIAGNOSIS — I25810 Atherosclerosis of coronary artery bypass graft(s) without angina pectoris: Secondary | ICD-10-CM | POA: Insufficient documentation

## 2018-03-05 DIAGNOSIS — Z7984 Long term (current) use of oral hypoglycemic drugs: Secondary | ICD-10-CM | POA: Insufficient documentation

## 2018-03-05 DIAGNOSIS — E039 Hypothyroidism, unspecified: Secondary | ICD-10-CM | POA: Insufficient documentation

## 2018-03-05 DIAGNOSIS — Z951 Presence of aortocoronary bypass graft: Secondary | ICD-10-CM

## 2018-03-05 DIAGNOSIS — Z7901 Long term (current) use of anticoagulants: Secondary | ICD-10-CM | POA: Insufficient documentation

## 2018-03-05 DIAGNOSIS — I480 Paroxysmal atrial fibrillation: Secondary | ICD-10-CM | POA: Diagnosis not present

## 2018-03-05 DIAGNOSIS — Z79890 Hormone replacement therapy (postmenopausal): Secondary | ICD-10-CM | POA: Diagnosis not present

## 2018-03-05 DIAGNOSIS — E7800 Pure hypercholesterolemia, unspecified: Secondary | ICD-10-CM | POA: Insufficient documentation

## 2018-03-05 DIAGNOSIS — I10 Essential (primary) hypertension: Secondary | ICD-10-CM | POA: Insufficient documentation

## 2018-03-05 DIAGNOSIS — Z8521 Personal history of malignant neoplasm of larynx: Secondary | ICD-10-CM | POA: Insufficient documentation

## 2018-03-05 NOTE — Progress Notes (Signed)
Daily Session Note  Patient Details  Name: Jermaine Campbell MRN: [id number] Date of Birth: 1945/10/18 Referring Provider:     Cardiac Rehab from 03/05/2018 in [facility]  Referring Provider  Dwaine Deter MD      Encounter Date: 03/05/2018  Check In: Session Check In - 03/05/18 1228      Check-In   Supervising physician immediately available to respond to emergencies  See telemetry face sheet for immediately available ER MD    Location  [facility]    Staff Present  Heath Lark, RN, BSN, CCRP;Meredith Sherryll Burger, RN BSN;Ngoc Detjen Luan Pulling, MA, RCEP, CCRP, Exercise Physiologist    VAD Patient?  No    PAD/SET Patient?  No      Pain Assessment   Currently in Pain?  No/denies        Exercise Prescription Changes - 03/05/18 1300      Response to Exercise   Blood Pressure (Admit)  126/66    Blood Pressure (Exercise)  146/82    Blood Pressure (Exit)  128/64    Heart Rate (Admit)  93 bpm    Heart Rate (Exercise)  106 bpm    Heart Rate (Exit)  87 bpm    Oxygen Saturation (Admit)  98 %    Oxygen Saturation (Exercise)  94 %    Rating of Perceived Exertion (Exercise)  11    Symptoms  none    Comments  walk test results       Social History   Tobacco Use  Smoking Status Never Smoker  Smokeless Tobacco Never Used    Goals Met:  Exercise tolerated well Personal goals reviewed No report of cardiac concerns or symptoms Strength training completed today  Goals Unmet:  Not Applicable  Comments: Medical evaluation and walk test results   Dr. Emily Filbert is Medical Director for [facility] and [facility].

## 2018-03-05 NOTE — Patient Instructions (Signed)
Patient Instructions  Patient Details  Name: Jermaine Campbell MRN: [id number] Date of Birth: Jun 20, 1945 Referring Provider:  Gustavo Lah, MD  Below are your personal goals for exercise, nutrition, and risk factors. Our goal is to help you stay on track towards obtaining and maintaining these goals. We will be discussing your progress on these goals with you throughout the program.  Initial Exercise Prescription: Initial Exercise Prescription - 03/05/18 1400      Date of Initial Exercise RX and Referring Provider   Date  03/05/18    Referring Provider  Dwaine Deter MD      Treadmill   MPH  2.2    Grade  0.5    Minutes  15    METs  2.84      NuStep   Level  2    SPM  80    Minutes  15    METs  2.5      REL-XR   Level  1    Speed  50    Minutes  15    METs  2.5      Prescription Details   Frequency (times per week)  2    Duration  Progress to 30 minutes of continuous aerobic without signs/symptoms of physical distress      Intensity   THRR 40-80% of Max Heartrate  108-133    Ratings of Perceived Exertion  11-13    Perceived Dyspnea  0-4      Progression   Progression  Continue progressive overload as per policy without signs/symptoms or physical distress.      Resistance Training   Training Prescription  Yes    Weight  3 lbs    Reps  10-15       Exercise Goals: Frequency: Be able to perform aerobic exercise two to three times per week in program working toward 2-5 days per week of home exercise.  Intensity: Work with a perceived exertion of 11 (fairly light) - 15 (hard) while following your exercise prescription.  We will make changes to your prescription with you as you progress through the program.   Duration: Be able to do 30 to 45 minutes of continuous aerobic exercise in addition to a 5 minute warm-up and a 5 minute cool-down routine.   Nutrition Goals: Your personal nutrition goals will be established when you do your nutrition analysis with  the dietician.  The following are general nutrition guidelines to follow: Cholesterol < 200mg /day Sodium < 1500mg /day Fiber: Men over 50 yrs - 30 grams per day  Personal Goals: Personal Goals and Risk Factors at Admission - 03/05/18 1323      Core Components/Risk Factors/Patient Goals on Admission    Weight Management  Weight Gain;Yes    Intervention  Weight Management: Develop a combined nutrition and exercise program designed to reach desired caloric intake, while maintaining appropriate intake of nutrient and fiber, sodium and fats, and appropriate energy expenditure required for the weight goal.;Weight Management: Provide education and appropriate resources to help participant work on and attain dietary goals.    Admit Weight  171 lb (77.6 kg)    Goal Weight: Short Term  173 lb (78.5 kg)    Goal Weight: Long Term  176 lb (79.8 kg)    Expected Outcomes  Short Term: Continue to assess and modify interventions until short term weight is achieved;Long Term: Adherence to nutrition and physical activity/exercise program aimed toward attainment of established weight goal   DAve and his  wife will talk with RD that has prescribed the tube feedings to see how to increase caloric intake.    Diabetes  Yes   The tube feedings that Waunita Schooner has been using have increased his blood glucose levels.    Intervention  Provide education about signs/symptoms and action to take for hypo/hyperglycemia.;Provide education about proper nutrition, including hydration, and aerobic/resistive exercise prescription along with prescribed medications to achieve blood glucose in normal ranges: Fasting glucose 65-99 mg/dL    Expected Outcomes  Short Term: Participant verbalizes understanding of the signs/symptoms and immediate care of hyper/hypoglycemia, proper foot care and importance of medication, aerobic/resistive exercise and nutrition plan for blood glucose control.;Long Term: Attainment of HbA1C < 7%.    Lipids  Yes     Intervention  Provide education and support for participant on nutrition & aerobic/resistive exercise along with prescribed medications to achieve LDL 70mg , HDL >40mg .    Expected Outcomes  Short Term: Participant states understanding of desired cholesterol values and is compliant with medications prescribed. Participant is following exercise prescription and nutrition guidelines.;Long Term: Cholesterol controlled with medications as prescribed, with individualized exercise RX and with personalized nutrition plan. Value goals: LDL < 70mg , HDL > 40 mg.       Tobacco Use Initial Evaluation: Social History   Tobacco Use  Smoking Status Never Smoker  Smokeless Tobacco Never Used    Exercise Goals and Review: Exercise Goals    Row Name 03/05/18 1428             Exercise Goals   Increase Physical Activity  Yes       Intervention  Provide advice, education, support and counseling about physical activity/exercise needs.;Develop an individualized exercise prescription for aerobic and resistive training based on initial evaluation findings, risk stratification, comorbidities and participant's personal goals.       Expected Outcomes  Short Term: Attend rehab on a regular basis to increase amount of physical activity.;Long Term: Exercising regularly at least 3-5 days a week.;Long Term: Add in home exercise to make exercise part of routine and to increase amount of physical activity.       Increase Strength and Stamina  Yes       Intervention  Provide advice, education, support and counseling about physical activity/exercise needs.;Develop an individualized exercise prescription for aerobic and resistive training based on initial evaluation findings, risk stratification, comorbidities and participant's personal goals.       Expected Outcomes  Short Term: Increase workloads from initial exercise prescription for resistance, speed, and METs.;Short Term: Perform resistance training exercises routinely  during rehab and add in resistance training at home;Long Term: Improve cardiorespiratory fitness, muscular endurance and strength as measured by increased METs and functional capacity (6MWT)       Able to understand and use rate of perceived exertion (RPE) scale  Yes       Intervention  Provide education and explanation on how to use RPE scale       Expected Outcomes  Short Term: Able to use RPE daily in rehab to express subjective intensity level;Long Term:  Able to use RPE to guide intensity level when exercising independently       Knowledge and understanding of Target Heart Rate Range (THRR)  Yes       Intervention  Provide education and explanation of THRR including how the numbers were predicted and where they are located for reference       Expected Outcomes  Short Term: Able to state/look up THRR;Short Term:  Able to use daily as guideline for intensity in rehab;Long Term: Able to use THRR to govern intensity when exercising independently       Able to check pulse independently  Yes       Intervention  Provide education and demonstration on how to check pulse in carotid and radial arteries.;Review the importance of being able to check your own pulse for safety during independent exercise       Expected Outcomes  Long Term: Able to check pulse independently and accurately;Short Term: Able to explain why pulse checking is important during independent exercise       Understanding of Exercise Prescription  Yes       Intervention  Provide education, explanation, and written materials on patient's individual exercise prescription       Expected Outcomes  Short Term: Able to explain program exercise prescription;Long Term: Able to explain home exercise prescription to exercise independently          Copy of goals given to participant.

## 2018-03-05 NOTE — Progress Notes (Signed)
Cardiac Individual Treatment Plan  Patient Details  Name: Jermaine Campbell MRN: [id number] Date of Birth: [birth year] Referring Provider:     Cardiac Rehab from 03/05/2018 in [facility] Cardiac and Pulmonary Rehab  Referring Provider  Dwaine Deter MD      Initial Encounter Date:    Cardiac Rehab from 03/05/2018 in [facility] Cardiac and Pulmonary Rehab  Date  03/05/18      Visit Diagnosis: S/P CABG (coronary artery bypass graft)  Patient's Home Medications on Admission:  Current Outpatient Medications:  .  ALPRAZolam (XANAX) 0.5 MG tablet, Place 0.25 mg into feeding tube at bedtime as needed for sleep., Disp: , Rfl:  .  atorvastatin (LIPITOR) 80 MG tablet, Place 80 mg into feeding tube daily., Disp: , Rfl:  .  isosorbide dinitrate (ISORDIL) 10 MG tablet, Place 0.5 mg into feeding tube 2 (two) times daily., Disp: , Rfl:  .  levothyroxine (SYNTHROID, LEVOTHROID) 100 MCG tablet, Take 100 mcg by mouth daily before breakfast. , Disp: , Rfl:  .  metFORMIN (GLUCOPHAGE) 500 MG tablet, Place 500 mg into feeding tube 2 (two) times daily with a meal., Disp: , Rfl:  .  ondansetron (ZOFRAN) 4 MG tablet, Place 4 mg into feeding tube every 8 (eight) hours as needed for nausea or vomiting., Disp: , Rfl:  .  rivaroxaban (XARELTO) 10 MG TABS tablet, Take 10 mg by mouth every evening. , Disp: , Rfl:  .  bicalutamide (CASODEX) 50 MG tablet, Take 50 mg by mouth daily., Disp: , Rfl: 2 .  docusate sodium (COLACE) 100 MG capsule, Take 1 capsule (100 mg total) by mouth 2 (two) times daily. (Patient not taking: Reported on 03/05/2018), Disp: 60 capsule, Rfl: 0 .  fluticasone (FLONASE) 50 MCG/ACT nasal spray, Place 1 spray into both nostrils as needed. , Disp: , Rfl:  .  HYDROcodone-acetaminophen (NORCO/VICODIN) 5-325 MG tablet, Take 1-2 tablets by mouth every 6 (six) hours as needed for moderate pain. (Patient not taking: Reported on 03/05/2018), Disp: 10 tablet, Rfl: 0 .  ibuprofen (ADVIL,MOTRIN) 200 MG tablet, Take 200  mg by mouth every 6 (six) hours as needed., Disp: , Rfl:  .  leuprolide (LUPRON) 1 MG/0.2ML injection, Inject into the skin every 3 (three) months. , Disp: , Rfl:  .  metoprolol tartrate (LOPRESSOR) 25 MG tablet, Take 12.5 mg by mouth 2 (two) times daily. , Disp: , Rfl:  .  polyethylene glycol (MIRALAX / GLYCOLAX) packet, Take 17 g by mouth daily., Disp: , Rfl:  .  simvastatin (ZOCOR) 40 MG tablet, Take 40 mg by mouth daily. , Disp: , Rfl:  .  tamsulosin (FLOMAX) 0.4 MG CAPS capsule, Take 1 capsule (0.4 mg total) by mouth daily. (Patient not taking: Reported on 08/21/2017), Disp: 30 capsule, Rfl: 0 .  vitamin B-12 (CYANOCOBALAMIN) 1000 MCG tablet, Take 1,000 mcg by mouth daily., Disp: , Rfl:   Past Medical History: Past Medical History:  Diagnosis Date  . Anemia   . Bradycardia   . 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 Cancer of prostate ([place]) 2019  . Coronary artery disease    atherosclerosis of native coronary artery of native heart without angina pectoris  . Deaf    Left ear  . Dysphagia    history of d/t throat cancer  . Dysrhythmia    paroxysmal atrial fibrillation, bradycardia  . H/O syncope    several yrs ago  . History of  BPH   . Hypercholesteremia   . Hypertension   . Hypothyroidism    s/p radiation tx - throat CA  . Myocardial infarction ([place])    1996  . Neck stiffness    s/p radiation tx - Throat CA  . Seizures ([place])    last one 2 yrs ago. not medicated. d/t injury to frontal lobe  . Vertigo    no episodes 4-5 yrs/dizziness  . Wears hearing aid    Right ear/ deaf in left ear    Tobacco Use: Social History   Tobacco Use  Smoking Status Never Smoker  Smokeless Tobacco Never Used    Labs: Recent Review Flowsheet Data    There is no flowsheet data to display.       Exercise Target Goals: Exercise Program Goal: Individual exercise prescription set using results from initial 6 min walk test and  THRR while considering  patient's activity barriers and safety.   Exercise Prescription Goal: Initial exercise prescription builds to 30-45 minutes a day of aerobic activity, 2-3 days per week.  Home exercise guidelines will be given to patient during program as part of exercise prescription that the participant will acknowledge.  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 HR  106 bpm     Max Ex. BP  146/82     2 Minute Post BP  128/64        Oxygen Initial Assessment:   Oxygen Re-Evaluation:   Oxygen Discharge (Final Oxygen Re-Evaluation):   Initial Exercise Prescription: Initial Exercise Prescription - 03/05/18 1400      Date of Initial Exercise RX and Referring Provider   Date  03/05/18    Referring Provider  Dwaine Deter MD      Treadmill   MPH  2.2    Grade  0.5    Minutes  15    METs  2.84      NuStep   Level  2    SPM  80    Minutes  15    METs  2.5      REL-XR   Level  1    Speed  50    Minutes  15    METs  2.5      Prescription Details   Frequency (times per week)  2    Duration  Progress to 30 minutes of continuous aerobic  without signs/symptoms of physical distress      Intensity   THRR 40-80% of Max Heartrate   108-133    Ratings of Perceived Exertion  11-13    Perceived Dyspnea  0-4      Progression   Progression  Continue progressive overload as per policy without signs/symptoms or physical distress.      Resistance Training   Training Prescription  Yes    Weight  3 lbs    Reps  10-15       Perform Capillary Blood Glucose checks as needed.  Exercise Prescription Changes: Exercise Prescription Changes    Row Name 03/05/18 1300             Response to Exercise   Blood Pressure (Admit)  126/66       Blood Pressure (Exercise)  146/82       Blood Pressure (Exit)  128/64       Heart Rate (Admit)  93 bpm       Heart Rate (Exercise)  106 bpm       Heart Rate (Exit)  87 bpm       Oxygen Saturation (Admit)  98 %       Oxygen Saturation (Exercise)  94 %       Rating of Perceived Exertion (Exercise)  11       Symptoms  none       Comments  walk test results          Exercise Comments:   Exercise Goals and Review: Exercise Goals    Row Name 03/05/18 1428             Exercise Goals   Increase Physical Activity  Yes       Intervention  Provide advice, education, support and counseling about physical activity/exercise needs.;Develop an individualized exercise prescription for aerobic and resistive training based on initial evaluation findings, risk stratification, comorbidities and participant's personal goals.       Expected Outcomes  Short Term: Attend rehab on a regular basis to increase amount of physical activity.;Long Term: Exercising regularly at least 3-5 days a week.;Long Term: Add in home exercise to make exercise part of routine and to increase amount of physical activity.       Increase Strength and Stamina  Yes       Intervention  Provide advice, education, support and counseling about physical activity/exercise needs.;Develop an individualized exercise prescription for aerobic and resistive training based on initial evaluation findings, risk stratification,  comorbidities and participant's personal goals.       Expected Outcomes  Short Term: Increase workloads from initial exercise prescription for resistance, speed, and METs.;Short Term: Perform resistance training exercises routinely during rehab and add in resistance training at home;Long Term: Improve cardiorespiratory fitness, muscular endurance and strength as measured by increased METs and functional capacity (6MWT)       Able to understand and use rate of perceived exertion (RPE) scale  Yes       Intervention  Provide education and explanation on how to use RPE scale       Expected Outcomes  Short Term: Able to use RPE daily in rehab to express subjective intensity level;Long Term:  Able to use RPE to guide intensity level when exercising independently       Knowledge and understanding of Target Heart Rate Range (THRR)  Yes       Intervention  Provide education and explanation of THRR including how the numbers were predicted  and where they are located for reference       Expected Outcomes  Short Term: Able to state/look up THRR;Short Term: Able to use daily as guideline for intensity in rehab;Long Term: Able to use THRR to govern intensity when exercising independently       Able to check pulse independently  Yes       Intervention  Provide education and demonstration on how to check pulse in carotid and radial arteries.;Review the importance of being able to check your own pulse for safety during independent exercise       Expected Outcomes  Long Term: Able to check pulse independently and accurately;Short Term: Able to explain why pulse checking is important during independent exercise       Understanding of Exercise Prescription  Yes       Intervention  Provide education, explanation, and written materials on patient's individual exercise prescription       Expected Outcomes  Short Term: Able to explain program exercise prescription;Long Term: Able to explain home exercise prescription to  exercise independently          Exercise Goals Re-Evaluation :   Discharge Exercise Prescription (Final Exercise Prescription Changes): Exercise Prescription Changes - 03/05/18 1300      Response to Exercise   Blood Pressure (Admit)  126/66    Blood Pressure (Exercise)  146/82    Blood Pressure (Exit)  128/64    Heart Rate (Admit)  93 bpm    Heart Rate (Exercise)  106 bpm    Heart Rate (Exit)  87 bpm    Oxygen Saturation (Admit)  98 %    Oxygen Saturation (Exercise)  94 %    Rating of Perceived Exertion (Exercise)  11    Symptoms  none    Comments  walk test results       Nutrition:  Target Goals: Understanding of nutrition guidelines, daily intake of sodium '1500mg'$ , cholesterol '200mg'$ , calories 30% from fat and 7% or less from saturated fats, daily to have 5 or more servings of fruits and vegetables.  Biometrics: Pre Biometrics - 03/05/18 1431      Pre Biometrics   Height  '5\' 9"'$  (1.753 m)    Weight  171 lb 1.6 oz (77.6 kg)    Waist Circumference  37 inches    Hip Circumference  42 inches    Waist to Hip Ratio  0.88 %    BMI (Calculated)  25.26    Single Leg Stand  2.69 seconds        Nutrition Therapy Plan and Nutrition Goals: Nutrition Therapy & Goals - 03/05/18 1320      Nutrition Therapy   Diet  Waunita Schooner is on G Tube feedings. HIs wife will be consulting with the RD that has set up the tube feedings.       Intervention Plan   Intervention  Prescribe, educate and counsel regarding individualized specific dietary modifications aiming towards targeted core components such as weight, hypertension, lipid management, diabetes, heart failure and other comorbidities.    Expected Outcomes  Short Term Goal: Understand basic principles of dietary content, such as calories, fat, sodium, cholesterol and nutrients.;Short Term Goal: A plan has been developed with personal nutrition goals set during dietitian appointment.;Long Term Goal: Adherence to prescribed nutrition plan.        Nutrition Assessments:   Nutrition Goals Re-Evaluation:   Nutrition Goals Discharge (Final Nutrition Goals Re-Evaluation):   Psychosocial: Target Goals: Acknowledge presence or absence of significant depression  and/or stress, maximize coping skills, provide positive support system. Participant is able to verbalize types and ability to use techniques and skills needed for reducing stress and depression.   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 He has multiple medical issues to work through before then. He scored a 9 on the PHQ9 and states that he is not able to get to sleep easily unless he uses the Xanax RX.       Family Dynamics   Good Support System?  Yes   Wife(Caregiver) and  his pastor     Barriers   Psychosocial barriers to participate in program  The patient should benefit from training in stress management and relaxation.;There are no identifiable barriers or psychosocial needs.      Screening Interventions   Interventions  Encouraged to exercise;To provide support and resources with identified psychosocial needs;Provide feedback about the scores to participant    Expected Outcomes  Short Term goal: Utilizing psychosocial counselor, staff and physician to assist with identification of specific Stressors or current issues interfering with healing process. Setting desired goal for each stressor or current issue identified.;Long Term Goal: Stressors or current issues are controlled or eliminated.;Short Term goal: Identification and review with participant of any Quality of Life or Depression concerns found by scoring the questionnaire.;Long Term goal: The  participant improves quality of Life and PHQ9 Scores as seen by post scores and/or verbalization of changes       Quality of Life Scores:  Quality of Life - 03/05/18 1320      Quality of Life   Select  Quality of Life      Quality of Life Scores   Health/Function Pre  18.23 %    Socioeconomic Pre  24.86 %    Psych/Spiritual Pre  22.93 %    Family Pre  28.8 %    GLOBAL Pre  22.12 %      Scores of 19 and below usually indicate a poorer quality of life in these areas.  A difference of  2-3 points is a clinically meaningful difference.  A difference of 2-3 points in the total score of the Quality of Life Index has been associated with significant improvement in overall quality of life, self-image, physical symptoms, and general health in studies assessing change in quality of life.  PHQ-9: Recent Review Flowsheet Data    Depression screen [facility] 2/9 03/05/2018   Decreased Interest 3   Down, Depressed, Hopeless 1   PHQ - 2 Score 4   Altered sleeping 2   Tired, decreased energy 3   Change in appetite 0   Feeling bad or failure about yourself  0   Trouble concentrating 0   Moving slowly or fidgety/restless 0   Suicidal thoughts 0   PHQ-9 Score 9   Difficult doing work/chores Somewhat difficult     Interpretation of Total Score  Total Score Depression Severity:  1-4 = Minimal depression, 5-9 = Mild depression, 10-14 = Moderate depression, 15-19 = Moderately severe depression, 20-27 = Severe depression   Psychosocial Evaluation and Intervention:   Psychosocial Re-Evaluation:   Psychosocial Discharge (Final Psychosocial Re-Evaluation):  Vocational Rehabilitation: Provide vocational rehab assistance to qualifying candidates.   Vocational Rehab Evaluation & Intervention: Vocational Rehab - 03/05/18 1323      Initial Vocational Rehab Evaluation & Intervention   Assessment shows need for Vocational Rehabilitation  No       Education: Education Goals: Education classes  will be provided on a variety of topics geared toward better understanding of heart health and risk factor modification. Participant will state understanding/return demonstration of topics presented as noted by education test scores.  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   General Nutrition Guidelines/Fats and Fiber: -Group instruction provided by verbal, written material, models and posters to present the general guidelines for heart healthy nutrition. Gives an explanation and review of dietary fats and fiber.   Controlling Sodium/Reading Food Labels: -Group verbal and written material supporting the discussion of sodium use in heart healthy nutrition. Review and explanation with models, verbal and written materials for utilization of the food label.   Exercise Physiology & General Exercise Guidelines: - Group verbal and written instruction with models to review the exercise physiology of the cardiovascular system and associated critical values. Provides general exercise guidelines with specific guidelines to those with heart or lung disease.    Aerobic Exercise & Resistance Training: - Gives group verbal and written instruction on the various components of exercise. Focuses on aerobic and resistive training programs and the benefits of this training and how to safely progress through these programs..   Flexibility, Balance, Mind/Body Relaxation: Provides group verbal/written instruction on the benefits of flexibility and balance training, including mind/body exercise modes such as yoga, pilates  and tai chi.  Demonstration and skill practice provided.   Stress and Anxiety: - Provides group verbal and written instruction about the health risks of elevated stress and causes of high stress.  Discuss the correlation between heart/lung disease and anxiety and treatment options. Review healthy ways to manage with stress and anxiety.   Depression: - Provides group verbal and written instruction on the correlation between heart/lung disease and depressed mood, treatment options, and the stigmas associated with seeking treatment.   Anatomy & Physiology of the Heart: - Group verbal and written instruction and models provide basic cardiac anatomy and physiology, with the coronary electrical and arterial systems. Review of Valvular disease and Heart Failure   Cardiac Procedures: - Group verbal and written instruction to review commonly prescribed medications for heart disease. Reviews the medication, class of the drug, and side effects. Includes the steps to properly store meds and maintain the prescription regimen. (beta blockers and nitrates)   Cardiac Medications I: - Group verbal and written instruction to review commonly prescribed medications for heart disease. Reviews the medication, class of the drug, and side effects. Includes the steps to properly store meds and maintain the prescription regimen.   Cardiac Medications II: -Group verbal and written instruction to review commonly prescribed medications for heart disease. Reviews the medication, class of the drug, and side effects. (all other drug classes)    Go Sex-Intimacy & Heart Disease, Get SMART - Goal Setting: - Group verbal and written  instruction through game format to discuss heart disease and the return to sexual intimacy. Provides group verbal and written material to discuss and apply goal setting through the application of the S.M.A.R.T. Method.   Other Matters of the Heart: - Provides group verbal, written materials  and models to describe Stable Angina and Peripheral Artery. Includes description of the disease process and treatment options available to the cardiac patient.   Exercise & Equipment Safety: - Individual verbal instruction and demonstration of equipment use and safety with use of the equipment.   Cardiac Rehab from 03/05/2018 in [facility] Cardiac and Pulmonary Rehab  Date  03/05/18  Educator  [facility]  Instruction Review Code  1- Verbalizes Understanding      Infection Prevention: - Provides verbal and written material to individual with discussion of infection control including proper hand washing and proper equipment cleaning during exercise session.   Cardiac Rehab from 03/05/2018 in [facility] Cardiac and Pulmonary Rehab  Date  03/05/18  Educator  [facility]  Instruction Review Code  1- Verbalizes Understanding      Falls Prevention: - Provides verbal and written material to individual with discussion of falls prevention and safety.   Cardiac Rehab from 03/05/2018 in [facility] Cardiac and Pulmonary Rehab  Date  03/05/18  Educator  [facility]  Instruction Review Code  1- Verbalizes Understanding      Diabetes: - Individual verbal and written instruction to review signs/symptoms of diabetes, desired ranges of glucose level fasting, after meals and with exercise. Acknowledge that pre and post exercise glucose checks will be done for 3 sessions at entry of program.   Cardiac Rehab from 03/05/2018 in [facility] Cardiac and Pulmonary Rehab  Date  03/05/18  Educator  [facility]  Instruction Review Code  1- Verbalizes Understanding      Know Your Numbers and Risk Factors: -Group verbal and written instruction about important numbers in your health.  Discussion of what are risk factors and how they play a role in the disease process.  Review of Cholesterol, Blood Pressure, Diabetes, and BMI and the role they play in your overall health.   Sleep Hygiene: -Provides group verbal and written instruction about how sleep can affect your  health.  Define sleep hygiene, discuss sleep cycles and impact of sleep habits. Review good sleep hygiene tips.    Other: -Provides group and verbal instruction on various topics (see comments)   Knowledge Questionnaire Score: Knowledge Questionnaire Score - 03/05/18 1322      Knowledge Questionnaire Score   Pre Score  24/26   reviewed correct responses with Waunita Schooner and his wife. They verbalized understanding of the responses and had no further questions today.       Core Components/Risk Factors/Patient Goals at Admission: Personal Goals and Risk Factors at Admission - 03/05/18 1323      Core Components/Risk Factors/Patient Goals on Admission    Weight Management  Weight Gain;Yes    Intervention  Weight Management: Develop a combined nutrition and exercise program designed to reach desired caloric intake, while maintaining appropriate intake of nutrient and fiber, sodium and fats, and appropriate energy expenditure required for the weight goal.;Weight Management: Provide education and appropriate resources to help participant work on and attain dietary goals.    Admit Weight  171 lb (77.6 kg)    Goal Weight: Short Term  173 lb (78.5 kg)    Goal Weight: Long Term  176 lb (79.8 kg)    Expected Outcomes  Short Term: Continue to assess and modify interventions  until short term weight is achieved;Long Term: Adherence to nutrition and physical activity/exercise program aimed toward attainment of established weight goal   DAve and his wife will talk with RD that has prescribed the tube feedings to see how to increase caloric intake.    Diabetes  Yes   The tube feedings that Waunita Schooner has been using have increased his blood glucose levels.    Intervention  Provide education about signs/symptoms and action to take for hypo/hyperglycemia.;Provide education about proper nutrition, including hydration, and aerobic/resistive exercise prescription along with prescribed medications to achieve blood glucose in  normal ranges: Fasting glucose 65-99 mg/dL    Expected Outcomes  Short Term: Participant verbalizes understanding of the signs/symptoms and immediate care of hyper/hypoglycemia, proper foot care and importance of medication, aerobic/resistive exercise and nutrition plan for blood glucose control.;Long Term: Attainment of HbA1C < 7%.    Lipids  Yes    Intervention  Provide education and support for participant on nutrition & aerobic/resistive exercise along with prescribed medications to achieve LDL '70mg'$ , HDL >'40mg'$ .    Expected Outcomes  Short Term: Participant states understanding of desired cholesterol values and is compliant with medications prescribed. Participant is following exercise prescription and nutrition guidelines.;Long Term: Cholesterol controlled with medications as prescribed, with individualized exercise RX and with personalized nutrition plan. Value goals: LDL < '70mg'$ , HDL > 40 mg.       Core Components/Risk Factors/Patient Goals Review:    Core Components/Risk Factors/Patient Goals at Discharge (Final Review):    ITP Comments: ITP Comments    Row Name 03/05/18 1225           ITP Comments  Medical review completed today. ITP sent to Dr. Loleta Chance for review,changes as needed and signature. Documentation of diagnosis can be found in [facility] 1/22 office visit          Comments: Initial ITP

## 2018-03-20 ENCOUNTER — Encounter: Payer: Medicare Other | Admitting: *Deleted

## 2018-03-20 DIAGNOSIS — Z951 Presence of aortocoronary bypass graft: Secondary | ICD-10-CM | POA: Diagnosis not present

## 2018-03-20 LAB — GLUCOSE, CAPILLARY
GLUCOSE-CAPILLARY: 173 mg/dL — AB (ref 70–99)
GLUCOSE-CAPILLARY: 110 mg/dL — AB (ref 70–99)

## 2018-03-20 NOTE — Progress Notes (Signed)
Daily Session Note  Patient Details  Name: Jermaine Campbell MRN: [id number] Date of Birth: [birth year] Referring Provider:     Cardiac Rehab from 03/05/2018 in [facility]  Referring Provider  Dwaine Deter MD      Encounter Date: 03/20/2018  Check In: Session Check In - 03/20/18 0917      Check-In   Supervising physician immediately available to respond to emergencies  See telemetry face sheet for immediately available ER MD    Location  [facility]-Cardiac & Pulmonary Rehab    Staff Present  Heath Lark, RN, BSN, CCRP;[name], MA, RCEP, CCRP, Exercise Physiologist;[name], [place], ACSM CEP, Exercise Physiologist    Medication changes reported      No    Fall or balance concerns reported     No    Warm-up and Cool-down  Performed as group-led instruction    Resistance Training Performed  Yes    VAD Patient?  No    PAD/SET Patient?  No      Pain Assessment   Currently in Pain?  No/denies          Social History   Tobacco Use  Smoking Status Never Smoker  Smokeless Tobacco Never Used    Goals Met:  Exercise tolerated well Personal goals reviewed No report of cardiac concerns or symptoms Strength training completed today  Goals Unmet:  Not Applicable  Comments: First full day of exercise!  Patient was oriented to gym and equipment including functions, settings, policies, and procedures.  Patient's individual exercise prescription and treatment plan were reviewed.  All starting workloads were established based on the results of the 6 minute walk test done at initial orientation visit.  The plan for exercise progression was also introduced and progression will be customized based on patient's performance and goals.    Dr. Emily Filbert is Medical Director for [facility] and [facility].

## 2018-03-21 ENCOUNTER — Encounter: Payer: Self-pay | Admitting: *Deleted

## 2018-03-21 DIAGNOSIS — Z951 Presence of aortocoronary bypass graft: Secondary | ICD-10-CM

## 2018-03-21 NOTE — Progress Notes (Signed)
Cardiac Individual Treatment Plan  Patient Details  Name: Jermaine Campbell MRN: [id number] Date of Birth: 20-Jul-1945 Referring Provider:     Cardiac Rehab from 03/05/2018 in [facility] Cardiac and Pulmonary Rehab  Referring Provider  Dwaine Deter MD      Initial Encounter Date:    Cardiac Rehab from 03/05/2018 in [facility] Cardiac and Pulmonary Rehab  Date  03/05/18      Visit Diagnosis: S/P CABG (coronary artery bypass graft)  Patient's Home Medications on Admission:  Current Outpatient Medications:  .  ALPRAZolam (XANAX) 0.5 MG tablet, Place 0.25 mg into feeding tube at bedtime as needed for sleep., Disp: , Rfl:  .  atorvastatin (LIPITOR) 80 MG tablet, Place 80 mg into feeding tube daily., Disp: , Rfl:  .  bicalutamide (CASODEX) 50 MG tablet, Take 50 mg by mouth daily., Disp: , Rfl: 2 .  docusate sodium (COLACE) 100 MG capsule, Take 1 capsule (100 mg total) by mouth 2 (two) times daily. (Patient not taking: Reported on 03/05/2018), Disp: 60 capsule, Rfl: 0 .  fluticasone (FLONASE) 50 MCG/ACT nasal spray, Place 1 spray into both nostrils as needed. , Disp: , Rfl:  .  HYDROcodone-acetaminophen (NORCO/VICODIN) 5-325 MG tablet, Take 1-2 tablets by mouth every 6 (six) hours as needed for moderate pain. (Patient not taking: Reported on 03/05/2018), Disp: 10 tablet, Rfl: 0 .  ibuprofen (ADVIL,MOTRIN) 200 MG tablet, Take 200 mg by mouth every 6 (six) hours as needed., Disp: , Rfl:  .  isosorbide dinitrate (ISORDIL) 10 MG tablet, Place 0.5 mg into feeding tube 2 (two) times daily., Disp: , Rfl:  .  leuprolide (LUPRON) 1 MG/0.2ML injection, Inject into the skin every 3 (three) months. , Disp: , Rfl:  .  levothyroxine (SYNTHROID, LEVOTHROID) 100 MCG tablet, Take 100 mcg by mouth daily before breakfast. , Disp: , Rfl:  .  metFORMIN (GLUCOPHAGE) 500 MG tablet, Place 500 mg into feeding tube 2 (two) times daily with a meal., Disp: , Rfl:  .  metoprolol tartrate (LOPRESSOR) 25 MG tablet, Take 12.5 mg by  mouth 2 (two) times daily. , Disp: , Rfl:  .  ondansetron (ZOFRAN) 4 MG tablet, Place 4 mg into feeding tube every 8 (eight) hours as needed for nausea or vomiting., Disp: , Rfl:  .  polyethylene glycol (MIRALAX / GLYCOLAX) packet, Take 17 g by mouth daily., Disp: , Rfl:  .  rivaroxaban (XARELTO) 10 MG TABS tablet, Take 10 mg by mouth every evening. , Disp: , Rfl:  .  simvastatin (ZOCOR) 40 MG tablet, Take 40 mg by mouth daily. , Disp: , Rfl:  .  tamsulosin (FLOMAX) 0.4 MG CAPS capsule, Take 1 capsule (0.4 mg total) by mouth daily. (Patient not taking: Reported on 08/21/2017), Disp: 30 capsule, Rfl: 0 .  vitamin B-12 (CYANOCOBALAMIN) 1000 MCG tablet, Take 1,000 mcg by mouth daily., Disp: , Rfl:   Past Medical History: Past Medical History:  Diagnosis Date  . Anemia   . Bradycardia   . 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 Cancer of prostate ([place]) 2019  . Coronary artery disease    atherosclerosis of native coronary artery of native heart without angina pectoris  . Deaf    Left ear  . Dysphagia    history of d/t throat cancer  . Dysrhythmia    paroxysmal atrial fibrillation, bradycardia  . H/O syncope    several yrs ago  . History of  BPH   . Hypercholesteremia   . Hypertension   . Hypothyroidism    s/p radiation tx - throat CA  . Myocardial infarction ([place])    1996  . Neck stiffness    s/p radiation tx - Throat CA  . Seizures ([place])    last one 2 yrs ago. not medicated. d/t injury to frontal lobe  . Vertigo    no episodes 4-5 yrs/dizziness  . Wears hearing aid    Right ear/ deaf in left ear    Tobacco Use: Social History   Tobacco Use  Smoking Status Never Smoker  Smokeless Tobacco Never Used    Labs: Recent Review Flowsheet Data    There is no flowsheet data to display.       Exercise Target Goals: Exercise Program Goal: Individual exercise prescription set using results from initial 6 min walk test  and THRR while considering  patient's activity barriers and safety.   Exercise Prescription Goal: Initial exercise prescription builds to 30-45 minutes a day of aerobic activity, 2-3 days per week.  Home exercise guidelines will be given to patient during program as part of exercise prescription that the participant will acknowledge.  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 HR  106 bpm     Max Ex. BP  146/82     2 Minute Post BP  128/64        Oxygen Initial Assessment:   Oxygen Re-Evaluation:   Oxygen Discharge (Final Oxygen Re-Evaluation):   Initial Exercise Prescription: Initial Exercise Prescription - 03/05/18 1400      Date of Initial Exercise RX and Referring Provider   Date  03/05/18    Referring Provider  Dwaine Deter MD      Treadmill   MPH  2.2    Grade  0.5    Minutes  15    METs  2.84      NuStep   Level  2    SPM  80    Minutes  15    METs  2.5      REL-XR   Level  1    Speed  50    Minutes  15    METs  2.5      Prescription Details   Frequency (times per week)  2    Duration  Progress to 30 minutes of continuous aerobic  without signs/symptoms of physical distress      Intensity   THRR 40-80% of Max  Heartrate  108-133    Ratings of Perceived Exertion  11-13    Perceived Dyspnea  0-4      Progression   Progression  Continue progressive overload as per policy without signs/symptoms or physical distress.      Resistance Training   Training Prescription  Yes    Weight  3 lbs    Reps  10-15       Perform Capillary Blood Glucose checks as needed.  Exercise Prescription Changes: Exercise Prescription Changes    Row Name 03/05/18 1300             Response to Exercise   Blood Pressure (Admit)  126/66       Blood Pressure (Exercise)  146/82       Blood Pressure (Exit)  128/64       Heart Rate (Admit)  93 bpm       Heart Rate (Exercise)  106 bpm       Heart Rate (Exit)  87 bpm       Oxygen Saturation (Admit)  98 %       Oxygen Saturation (Exercise)  94 %       Rating of Perceived Exertion (Exercise)  11       Symptoms  none       Comments  walk test results          Exercise Comments: Exercise Comments    Row Name 03/20/18 0917           Exercise Comments  First full day of exercise!  Patient was oriented to gym and equipment including functions, settings, policies, and procedures.  Patient's individual exercise prescription and treatment plan were reviewed.  All starting workloads were established based on the results of the 6 minute walk test done at initial orientation visit.  The plan for exercise progression was also introduced and progression will be customized based on patient's performance and goals.          Exercise Goals and Review: Exercise Goals    Row Name 03/05/18 1428             Exercise Goals   Increase Physical Activity  Yes       Intervention  Provide advice, education, support and counseling about physical activity/exercise needs.;Develop an individualized exercise prescription for aerobic and resistive training based on initial evaluation findings, risk stratification, comorbidities and participant's personal goals.       Expected Outcomes   Short Term: Attend rehab on a regular basis to increase amount of physical activity.;Long Term: Exercising regularly at least 3-5 days a week.;Long Term: Add in home exercise to make exercise part of routine and to increase amount of physical activity.       Increase Strength and Stamina  Yes       Intervention  Provide advice, education, support and counseling about physical activity/exercise needs.;Develop an individualized exercise prescription for aerobic and resistive training based on initial evaluation findings, risk stratification, comorbidities and participant's personal goals.       Expected Outcomes  Short Term: Increase workloads from initial exercise prescription for resistance, speed, and METs.;Short Term: Perform resistance training exercises routinely during rehab and add in resistance training at home;Long Term: Improve cardiorespiratory fitness, muscular endurance and strength as measured by increased METs and functional capacity (6MWT)       Able to understand and use  rate of perceived exertion (RPE) scale  Yes       Intervention  Provide education and explanation on how to use RPE scale       Expected Outcomes  Short Term: Able to use RPE daily in rehab to express subjective intensity level;Long Term:  Able to use RPE to guide intensity level when exercising independently       Knowledge and understanding of Target Heart Rate Range (THRR)  Yes       Intervention  Provide education and explanation of THRR including how the numbers were predicted and where they are located for reference       Expected Outcomes  Short Term: Able to state/look up THRR;Short Term: Able to use daily as guideline for intensity in rehab;Long Term: Able to use THRR to govern intensity when exercising independently       Able to check pulse independently  Yes       Intervention  Provide education and demonstration on how to check pulse in carotid and radial arteries.;Review the importance of being able to check  your own pulse for safety during independent exercise       Expected Outcomes  Long Term: Able to check pulse independently and accurately;Short Term: Able to explain why pulse checking is important during independent exercise       Understanding of Exercise Prescription  Yes       Intervention  Provide education, explanation, and written materials on patient's individual exercise prescription       Expected Outcomes  Short Term: Able to explain program exercise prescription;Long Term: Able to explain home exercise prescription to exercise independently          Exercise Goals Re-Evaluation : Exercise Goals Re-Evaluation    [place] Name 03/20/18 0917             Exercise Goal Re-Evaluation   Exercise Goals Review  Increase Physical Activity;Increase Strength and Stamina;Knowledge and understanding of Target Heart Rate Range (THRR);Able to understand and use rate of perceived exertion (RPE) scale;Understanding of Exercise Prescription       Comments  Reviewed RPE scale, THR and program prescription with pt today.  Pt voiced understanding and was given a copy of goals to take home.        Expected Outcomes  Short: Use RPE daily to regulate intensity. Long: Follow program prescription in THR.          Discharge Exercise Prescription (Final Exercise Prescription Changes): Exercise Prescription Changes - 03/05/18 1300      Response to Exercise   Blood Pressure (Admit)  126/66    Blood Pressure (Exercise)  146/82    Blood Pressure (Exit)  128/64    Heart Rate (Admit)  93 bpm    Heart Rate (Exercise)  106 bpm    Heart Rate (Exit)  87 bpm    Oxygen Saturation (Admit)  98 %    Oxygen Saturation (Exercise)  94 %    Rating of Perceived Exertion (Exercise)  11    Symptoms  none    Comments  walk test results       Nutrition:  Target Goals: Understanding of nutrition guidelines, daily intake of sodium '1500mg'$ , cholesterol '200mg'$ , calories 30% from fat and 7% or less from saturated fats,  daily to have 5 or more servings of fruits and vegetables.  Biometrics: Pre Biometrics - 03/05/18 1431      Pre Biometrics   Height  '5\' 9"'$  (1.753 m)  Weight  171 lb 1.6 oz (77.6 kg)    Waist Circumference  37 inches    Hip Circumference  42 inches    Waist to Hip Ratio  0.88 %    BMI (Calculated)  25.26    Single Leg Stand  2.69 seconds        Nutrition Therapy Plan and Nutrition Goals: Nutrition Therapy & Goals - 03/05/18 1320      Nutrition Therapy   Diet  Waunita Schooner is on G Tube feedings. HIs wife will be consulting with the RD that has set up the tube feedings.       Intervention Plan   Intervention  Prescribe, educate and counsel regarding individualized specific dietary modifications aiming towards targeted core components such as weight, hypertension, lipid management, diabetes, heart failure and other comorbidities.    Expected Outcomes  Short Term Goal: Understand basic principles of dietary content, such as calories, fat, sodium, cholesterol and nutrients.;Short Term Goal: A plan has been developed with personal nutrition goals set during dietitian appointment.;Long Term Goal: Adherence to prescribed nutrition plan.       Nutrition Assessments:   Nutrition Goals Re-Evaluation:   Nutrition Goals Discharge (Final Nutrition Goals Re-Evaluation):   Psychosocial: Target Goals: Acknowledge presence or absence of significant depression and/or stress, maximize coping skills, provide positive support system. Participant is able to verbalize types and ability to use techniques and skills needed for reducing stress and depression.   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 He has multiple medical issues to work through before then. He scored a 9 on the PHQ9 and states that he is not able to get to sleep easily unless he uses the Xanax RX.       Family Dynamics   Good Support System?  Yes   Wife(Caregiver) and  his pastor     Barriers   Psychosocial barriers to participate in program  The patient should benefit from training in stress management and relaxation.;There are no identifiable barriers or psychosocial needs.      Screening Interventions   Interventions  Encouraged to exercise;To provide support and resources with identified psychosocial needs;Provide feedback about the scores to participant    Expected Outcomes  Short Term goal: Utilizing psychosocial counselor, staff and physician to assist with identification of specific Stressors or current issues interfering with healing process. Setting desired goal for each stressor or current issue identified.;Long Term Goal: Stressors or current issues are controlled or eliminated.;Short Term goal: Identification and review with participant of any Quality of Life or Depression concerns found by scoring the questionnaire.;Long Term goal: The participant improves quality of Life and PHQ9 Scores as seen by post scores and/or verbalization of changes       Quality of Life Scores:  Quality of Life - 03/05/18 1320      Quality of Life   Select  Quality of Life      Quality of Life Scores   Health/Function Pre  18.23 %    Socioeconomic Pre  24.86 %    Psych/Spiritual Pre  22.93 %    Family Pre  28.8 %    GLOBAL  Pre  22.12 %      Scores of 19 and below usually indicate a poorer quality of life in these areas.  A difference of  2-3 points is a clinically meaningful difference.  A difference of 2-3 points in the total score of the Quality of Life Index has been associated with significant improvement in overall quality of life, self-image, physical symptoms, and general health in studies assessing change  in quality of life.  PHQ-9: Recent Review Flowsheet Data    Depression screen [facility] 2/9 03/05/2018   Decreased Interest 3   Down, Depressed, Hopeless 1   PHQ - 2 Score 4   Altered sleeping 2   Tired, decreased energy 3   Change in appetite 0   Feeling bad or failure about yourself  0   Trouble concentrating 0   Moving slowly or fidgety/restless 0   Suicidal thoughts 0   PHQ-9 Score 9   Difficult doing work/chores Somewhat difficult     Interpretation of Total Score  Total Score Depression Severity:  1-4 = Minimal depression, 5-9 = Mild depression, 10-14 = Moderate depression, 15-19 = Moderately severe depression, 20-27 = Severe depression   Psychosocial Evaluation and Intervention:   Psychosocial Re-Evaluation:   Psychosocial Discharge (Final Psychosocial Re-Evaluation):   Vocational Rehabilitation: Provide vocational rehab assistance to qualifying candidates.   Vocational Rehab Evaluation & Intervention: Vocational Rehab - 03/05/18 1323      Initial Vocational Rehab Evaluation & Intervention   Assessment shows need for Vocational Rehabilitation  No       Education: Education Goals: Education classes will be provided on a variety of topics geared toward better understanding of heart health and risk factor modification. Participant will state understanding/return demonstration of topics presented as noted by education test scores.  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   Cardiac Rehab from 03/20/2018 in [facility] Cardiac and Pulmonary Rehab  Date  03/20/18  Educator  SB   Instruction Review Code  1- Verbalizes Understanding      General Nutrition Guidelines/Fats and Fiber: -Group instruction provided by verbal, written material, models and posters to present the general guidelines for heart healthy nutrition. Gives an explanation and review of dietary fats and fiber.   Controlling Sodium/Reading Food Labels: -Group verbal and written material supporting the discussion of sodium use in heart healthy nutrition. Review and explanation with models, verbal and written materials for utilization of the food label.   Exercise Physiology & General Exercise Guidelines: - Group verbal and written instruction with models to review the exercise physiology of the cardiovascular system and associated critical values. Provides general exercise guidelines with specific guidelines to those with heart or lung disease.    Aerobic Exercise & Resistance Training: - Gives group verbal and written instruction on the various components of exercise. Focuses on aerobic and resistive training programs and the benefits of this training and how to safely progress through these programs..   Flexibility, Balance, Mind/Body Relaxation: Provides group verbal/written instruction on the benefits of flexibility and balance training, including mind/body exercise modes such as yoga, pilates and tai chi.  Demonstration and skill practice provided.   Stress and Anxiety: - Provides group verbal and written instruction about the health risks of elevated stress and causes of high stress.  Discuss the correlation between heart/lung disease and anxiety and treatment options. Review healthy ways to manage with stress and anxiety.   Depression: - Provides group verbal and written instruction on the correlation between heart/lung disease and depressed mood, treatment options, and the stigmas associated with seeking treatment.   Anatomy & Physiology of the Heart: - Group verbal and written  instruction and models provide basic cardiac anatomy and physiology, with the coronary electrical and arterial systems. Review of Valvular disease and Heart Failure   Cardiac Procedures: - Group verbal and written instruction to review commonly prescribed medications for heart disease. Reviews the medication, class of the drug, and side effects. Includes the steps to properly store meds and maintain the prescription regimen. (beta blockers and nitrates)   Cardiac Medications I: - Group verbal and written instruction to review commonly prescribed medications for heart disease. Reviews the medication, class of the drug, and side effects. Includes the steps to properly store meds and maintain the prescription regimen.   Cardiac Medications II: -Group verbal and written instruction to review commonly prescribed medications for heart disease. Reviews the medication, class of the drug, and side effects. (all other drug classes)    Go Sex-Intimacy & Heart Disease, Get SMART - Goal Setting: - Group verbal and written instruction through game format to discuss heart disease and the return to sexual intimacy. Provides group verbal and written material to discuss and apply goal setting through the application of the S.M.A.R.T. Method.   Other Matters of the Heart: - Provides group verbal, written materials and models to describe Stable Angina and Peripheral Artery. Includes description of the disease process and treatment options available to the cardiac patient.   Exercise & Equipment Safety: - Individual verbal instruction and demonstration of equipment use and safety with use of the equipment.   Cardiac Rehab from 03/20/2018 in [facility] Cardiac and Pulmonary Rehab  Date  03/05/18  Educator  [facility]  Instruction Review Code  1- Verbalizes Understanding      Infection Prevention: - Provides verbal and written material to individual with discussion of infection control including proper hand washing and  proper equipment cleaning during exercise session.   Cardiac Rehab from 03/20/2018 in [facility] Cardiac and Pulmonary Rehab  Date  03/05/18  Educator  [facility]  Instruction Review Code  1- Verbalizes Understanding      Falls Prevention: - Provides verbal and written material to individual with discussion of falls prevention and safety.   Cardiac Rehab from 03/20/2018 in [facility] Cardiac and Pulmonary Rehab  Date  03/05/18  Educator  [facility]  Instruction Review Code  1- Verbalizes Understanding      Diabetes: - Individual verbal and written instruction to review signs/symptoms of diabetes, desired ranges of glucose level fasting, after meals and with exercise. Acknowledge that pre and post exercise glucose checks will be done for 3 sessions at entry of program.   Cardiac Rehab from 03/20/2018 in [facility] Cardiac and Pulmonary Rehab  Date  03/05/18  Educator  SB  Instruction Review Code  1- Verbalizes Understanding      Know Your Numbers and Risk Factors: -Group verbal and written instruction about important numbers in your health.  Discussion of what are risk factors and how they play a role in the disease process.  Review of Cholesterol, Blood Pressure, Diabetes, and  BMI and the role they play in your overall health.   Sleep Hygiene: -Provides group verbal and written instruction about how sleep can affect your health.  Define sleep hygiene, discuss sleep cycles and impact of sleep habits. Review good sleep hygiene tips.    Other: -Provides group and verbal instruction on various topics (see comments)   Knowledge Questionnaire Score: Knowledge Questionnaire Score - 03/05/18 1322      Knowledge Questionnaire Score   Pre Score  24/26   reviewed correct responses with Waunita Schooner and his wife. They verbalized understanding of the responses and had no further questions today.       Core Components/Risk Factors/Patient Goals at Admission: Personal Goals and Risk Factors at Admission - 03/05/18 1323       Core Components/Risk Factors/Patient Goals on Admission    Weight Management  Weight Gain;Yes    Intervention  Weight Management: Develop a combined nutrition and exercise program designed to reach desired caloric intake, while maintaining appropriate intake of nutrient and fiber, sodium and fats, and appropriate energy expenditure required for the weight goal.;Weight Management: Provide education and appropriate resources to help participant work on and attain dietary goals.    Admit Weight  171 lb (77.6 kg)    Goal Weight: Short Term  173 lb (78.5 kg)    Goal Weight: Long Term  176 lb (79.8 kg)    Expected Outcomes  Short Term: Continue to assess and modify interventions until short term weight is achieved;Long Term: Adherence to nutrition and physical activity/exercise program aimed toward attainment of established weight goal   DAve and his wife will talk with RD that has prescribed the tube feedings to see how to increase caloric intake.    Diabetes  Yes   The tube feedings that Waunita Schooner has been using have increased his blood glucose levels.    Intervention  Provide education about signs/symptoms and action to take for hypo/hyperglycemia.;Provide education about proper nutrition, including hydration, and aerobic/resistive exercise prescription along with prescribed medications to achieve blood glucose in normal ranges: Fasting glucose 65-99 mg/dL    Expected Outcomes  Short Term: Participant verbalizes understanding of the signs/symptoms and immediate care of hyper/hypoglycemia, proper foot care and importance of medication, aerobic/resistive exercise and nutrition plan for blood glucose control.;Long Term: Attainment of HbA1C < 7%.    Lipids  Yes    Intervention  Provide education and support for participant on nutrition & aerobic/resistive exercise along with prescribed medications to achieve LDL '70mg'$ , HDL >'40mg'$ .    Expected Outcomes  Short Term: Participant states understanding of desired  cholesterol values and is compliant with medications prescribed. Participant is following exercise prescription and nutrition guidelines.;Long Term: Cholesterol controlled with medications as prescribed, with individualized exercise RX and with personalized nutrition plan. Value goals: LDL < '70mg'$ , HDL > 40 mg.       Core Components/Risk Factors/Patient Goals Review:    Core Components/Risk Factors/Patient Goals at Discharge (Final Review):    ITP Comments: ITP Comments    Row Name 03/05/18 1225 03/21/18 0612         ITP Comments  Medical review completed today. ITP sent to Dr. Loleta Chance for review,changes as needed and signature. Documentation of diagnosis can be found in [place] 1/22 office visit  30 day review. Continue with ITP unless directed changes by Medical Director chart review. New to program.         Comments:

## 2018-03-22 DIAGNOSIS — Z951 Presence of aortocoronary bypass graft: Secondary | ICD-10-CM

## 2018-03-22 NOTE — Progress Notes (Signed)
Daily Session Note  Patient Details  Name: Jermaine Campbell MRN: [id number] Date of Birth: 01-19-1946 Referring Provider:     Cardiac Rehab from 03/05/2018 in [facility] Cardiac and Pulmonary Rehab  Referring Provider  Dwaine Deter MD      Encounter Date: 03/22/2018  Check In: Session Check In - 03/22/18 0921      Check-In   Supervising physician immediately available to respond to emergencies  See telemetry face sheet for immediately available ER MD    Location  [facility]-Cardiac & Pulmonary Rehab    Staff Present  Jasper Loser BS, Exercise Physiologist;Carroll Enterkin, RN, BSN;Jessica Luan Pulling, MA, RCEP, CCRP, Exercise Physiologist;Ansel Ferrall Tessie Fass RCP,RRT,BSRT    Medication changes reported      No    Fall or balance concerns reported     No    Warm-up and Cool-down  Performed as group-led instruction    Resistance Training Performed  Yes    VAD Patient?  No      Pain Assessment   Currently in Pain?  No/denies          Social History   Tobacco Use  Smoking Status Never Smoker  Smokeless Tobacco Never Used    Goals Met:  Independence with exercise equipment Exercise tolerated well No report of cardiac concerns or symptoms Strength training completed today  Goals Unmet:  Not Applicable  Comments: Pt able to follow exercise prescription today without complaint.  Will continue to monitor for progression.    Dr. Emily Filbert is Medical Director for [facility] and [facility].

## 2018-03-27 ENCOUNTER — Encounter: Payer: Medicare Other | Attending: Cardiothoracic Surgery

## 2018-03-27 DIAGNOSIS — I480 Paroxysmal atrial fibrillation: Secondary | ICD-10-CM | POA: Insufficient documentation

## 2018-03-27 DIAGNOSIS — Z923 Personal history of irradiation: Secondary | ICD-10-CM | POA: Diagnosis not present

## 2018-03-27 DIAGNOSIS — Z7984 Long term (current) use of oral hypoglycemic drugs: Secondary | ICD-10-CM | POA: Insufficient documentation

## 2018-03-27 DIAGNOSIS — Z7901 Long term (current) use of anticoagulants: Secondary | ICD-10-CM | POA: Diagnosis not present

## 2018-03-27 DIAGNOSIS — E039 Hypothyroidism, unspecified: Secondary | ICD-10-CM | POA: Insufficient documentation

## 2018-03-27 DIAGNOSIS — Z79899 Other long term (current) drug therapy: Secondary | ICD-10-CM | POA: Diagnosis not present

## 2018-03-27 DIAGNOSIS — E7800 Pure hypercholesterolemia, unspecified: Secondary | ICD-10-CM | POA: Diagnosis not present

## 2018-03-27 DIAGNOSIS — Z951 Presence of aortocoronary bypass graft: Secondary | ICD-10-CM

## 2018-03-27 DIAGNOSIS — I10 Essential (primary) hypertension: Secondary | ICD-10-CM | POA: Insufficient documentation

## 2018-03-27 DIAGNOSIS — Z8521 Personal history of malignant neoplasm of larynx: Secondary | ICD-10-CM | POA: Diagnosis not present

## 2018-03-27 DIAGNOSIS — Z8546 Personal history of malignant neoplasm of prostate: Secondary | ICD-10-CM | POA: Diagnosis not present

## 2018-03-27 DIAGNOSIS — I25810 Atherosclerosis of coronary artery bypass graft(s) without angina pectoris: Secondary | ICD-10-CM | POA: Insufficient documentation

## 2018-03-27 DIAGNOSIS — Z79890 Hormone replacement therapy (postmenopausal): Secondary | ICD-10-CM | POA: Diagnosis not present

## 2018-03-27 DIAGNOSIS — I252 Old myocardial infarction: Secondary | ICD-10-CM | POA: Insufficient documentation

## 2018-03-27 LAB — GLUCOSE, CAPILLARY
GLUCOSE-CAPILLARY: 120 mg/dL — AB (ref 70–99)
Glucose-Capillary: 130 mg/dL — ABNORMAL HIGH (ref 70–99)

## 2018-03-27 NOTE — Progress Notes (Signed)
Daily Session Note  Patient Details  Name: LADARRION TELFAIR MRN: [id number] Date of Birth: 1945-12-26 Referring Provider:     Cardiac Rehab from 03/05/2018 in [facility] Cardiac and Pulmonary Rehab  Referring Provider  Dwaine Deter MD      Encounter Date: 03/27/2018  Check In: Session Check In - 03/27/18 0919      Check-In   Supervising physician immediately available to respond to emergencies  See telemetry face sheet for immediately available ER MD    Location  [facility]-Cardiac & Pulmonary Rehab    Staff Present  Alberteen Sam, MA, RCEP, CCRP, Exercise Physiologist;Genesis Paget RCP,RRT,BSRT;Jeanna Durrell BS, Exercise Physiologist;Susanne Bice, RN, BSN, CCRP    Medication changes reported      No    Fall or balance concerns reported     No    Warm-up and Cool-down  Performed as group-led Higher education careers adviser Performed  Yes    VAD Patient?  No    PAD/SET Patient?  No      Pain Assessment   Currently in Pain?  No/denies          Social History   Tobacco Use  Smoking Status Never Smoker  Smokeless Tobacco Never Used    Goals Met:  Independence with exercise equipment Exercise tolerated well No report of cardiac concerns or symptoms Strength training completed today  Goals Unmet:  Not Applicable  Comments: Pt able to follow exercise prescription today without complaint.  Will continue to monitor for progression.    Dr. Emily Filbert is Medical Director for [place] and [facility].

## 2018-03-29 DIAGNOSIS — Z951 Presence of aortocoronary bypass graft: Secondary | ICD-10-CM | POA: Diagnosis not present

## 2018-03-29 LAB — GLUCOSE, CAPILLARY
GLUCOSE-CAPILLARY: 108 mg/dL — AB (ref 70–99)
Glucose-Capillary: 100 mg/dL — ABNORMAL HIGH (ref 70–99)

## 2018-03-29 NOTE — Progress Notes (Signed)
Daily Session Note  Patient Details  Name: Jermaine Campbell MRN: [id number] Date of Birth: [birth year] Referring Provider:     Cardiac Rehab from 03/05/2018 in [facility] Cardiac and Pulmonary Rehab  Referring Provider  Dwaine Deter MD      Encounter Date: 03/29/2018  Check In: Session Check In - 03/29/18 0912      Check-In   Supervising physician immediately available to respond to emergencies  See telemetry face sheet for immediately available ER MD    Location  [facility]-Cardiac & Pulmonary Rehab    Staff Present  Gerlene Burdock, RN, BSN;Jessica Luan Pulling, MA, RCEP, CCRP, Exercise Physiologist;Allana Shrestha RCP,RRT,BSRT;Jeanna Durrell BS, Exercise Physiologist    Medication changes reported      No    Fall or balance concerns reported     No    Warm-up and Cool-down  Performed as group-led Higher education careers adviser Performed  Yes    VAD Patient?  No    PAD/SET Patient?  No      Pain Assessment   Currently in Pain?  No/denies          Social History   Tobacco Use  Smoking Status Never Smoker  Smokeless Tobacco Never Used    Goals Met:  Independence with exercise equipment Exercise tolerated well No report of cardiac concerns or symptoms Strength training completed today  Goals Unmet:  Not Applicable  Comments: Pt able to follow exercise prescription today without complaint.  Will continue to monitor for progression.   Dr. Emily Filbert is Medical Director for [place] and [facility].

## 2018-04-03 ENCOUNTER — Encounter: Payer: Medicare Other | Admitting: *Deleted

## 2018-04-03 DIAGNOSIS — Z951 Presence of aortocoronary bypass graft: Secondary | ICD-10-CM

## 2018-04-03 NOTE — Progress Notes (Signed)
Daily Session Note  Patient Details  Name: GOEBEL HELLUMS MRN: [id number] Date of Birth: 13-Aug-1945 Referring Provider:     Cardiac Rehab from 03/05/2018 in [facility] Cardiac and Pulmonary Rehab  Referring Provider  Dwaine Deter MD      Encounter Date: 04/03/2018  Check In: Session Check In - 04/03/18 0921      Check-In   Supervising physician immediately available to respond to emergencies  See telemetry face sheet for immediately available ER MD    Location  [facility]-Cardiac & Pulmonary Rehab    Staff Present  Heath Lark, RN, BSN, CCRP;Jeanna Durrell BS, Exercise Physiologist;Jessica [place], MA, RCEP, CCRP, Exercise Physiologist;[name], [place], ACSM CEP, Exercise Physiologist    Medication changes reported      No    Fall or balance concerns reported     No    Warm-up and Cool-down  Performed as group-led instruction    Resistance Training Performed  Yes    VAD Patient?  No    PAD/SET Patient?  No      Pain Assessment   Currently in Pain?  No/denies          Social History   Tobacco Use  Smoking Status Never Smoker  Smokeless Tobacco Never Used    Goals Met:  Independence with exercise equipment Exercise tolerated well Personal goals reviewed No report of cardiac concerns or symptoms Strength training completed today  Goals Unmet:  Not Applicable  Comments: Pt able to follow exercise prescription today without complaint.  Will continue to monitor for progression.    Dr. Emily Filbert is Medical Director for [place] and [facility].

## 2018-04-05 ENCOUNTER — Other Ambulatory Visit: Payer: Self-pay

## 2018-04-05 DIAGNOSIS — Z951 Presence of aortocoronary bypass graft: Secondary | ICD-10-CM

## 2018-04-05 NOTE — Progress Notes (Signed)
Daily Session Note  Patient Details  Name: Jermaine Campbell MRN: [id number] Date of Birth: 1945/09/04 Referring Provider:     Cardiac Rehab from 03/05/2018 in [facility] Cardiac and Pulmonary Rehab  Referring Provider  Dwaine Deter MD      Encounter Date: 04/05/2018  Check In: Session Check In - 04/05/18 1233      Check-In   Supervising physician immediately available to respond to emergencies  See telemetry face sheet for immediately available ER MD    Location  [facility]-Cardiac & Pulmonary Rehab    Staff Present  Heath Lark, RN, BSN, CCRP;Jessica [place], MA, RCEP, CCRP, Exercise Physiologist;Roya Gieselman Tessie Fass RCP,RRT,BSRT    Medication changes reported      No    Fall or balance concerns reported     No    Warm-up and Cool-down  Performed as group-led instruction    Resistance Training Performed  Yes    VAD Patient?  No    PAD/SET Patient?  No      Pain Assessment   Currently in Pain?  No/denies          Social History   Tobacco Use  Smoking Status Never Smoker  Smokeless Tobacco Never Used    Goals Met:  Independence with exercise equipment Exercise tolerated well No report of cardiac concerns or symptoms Strength training completed today  Goals Unmet:  Not Applicable  Comments: Pt able to follow exercise prescription today without complaint.  Will continue to monitor for progression.    Dr. Emily Filbert is Medical Director for [facility] and [facility].

## 2018-04-18 ENCOUNTER — Encounter: Payer: Self-pay | Admitting: *Deleted

## 2018-04-18 DIAGNOSIS — Z951 Presence of aortocoronary bypass graft: Secondary | ICD-10-CM

## 2018-04-18 DIAGNOSIS — I25719 Atherosclerosis of autologous vein coronary artery bypass graft(s) with unspecified angina pectoris: Secondary | ICD-10-CM | POA: Insufficient documentation

## 2018-04-18 NOTE — Progress Notes (Signed)
Cardiac Individual Treatment Plan  Patient Details  Name: Jermaine Campbell MRN: [id number] Date of Birth: 20-Jul-1945 Referring Provider:     Cardiac Rehab from 03/05/2018 in [facility] Cardiac and Pulmonary Rehab  Referring Provider  Jermaine Deter MD      Initial Encounter Date:    Cardiac Rehab from 03/05/2018 in [facility] Cardiac and Pulmonary Rehab  Date  03/05/18      Visit Diagnosis: S/P CABG (coronary artery bypass graft)  Patient's Home Medications on Admission:  Current Outpatient Medications:  .  ALPRAZolam (XANAX) 0.5 MG tablet, Place 0.25 mg into feeding tube at bedtime as needed for sleep., Disp: , Rfl:  .  atorvastatin (LIPITOR) 80 MG tablet, Place 80 mg into feeding tube daily., Disp: , Rfl:  .  bicalutamide (CASODEX) 50 MG tablet, Take 50 mg by mouth daily., Disp: , Rfl: 2 .  docusate sodium (COLACE) 100 MG capsule, Take 1 capsule (100 mg total) by mouth 2 (two) times daily. (Patient not taking: Reported on 03/05/2018), Disp: 60 capsule, Rfl: 0 .  fluticasone (FLONASE) 50 MCG/ACT nasal spray, Place 1 spray into both nostrils as needed. , Disp: , Rfl:  .  HYDROcodone-acetaminophen (NORCO/VICODIN) 5-325 MG tablet, Take 1-2 tablets by mouth every 6 (six) hours as needed for moderate pain. (Patient not taking: Reported on 03/05/2018), Disp: 10 tablet, Rfl: 0 .  ibuprofen (ADVIL,MOTRIN) 200 MG tablet, Take 200 mg by mouth every 6 (six) hours as needed., Disp: , Rfl:  .  isosorbide dinitrate (ISORDIL) 10 MG tablet, Place 0.5 mg into feeding tube 2 (two) times daily., Disp: , Rfl:  .  leuprolide (LUPRON) 1 MG/0.2ML injection, Inject into the skin every 3 (three) months. , Disp: , Rfl:  .  levothyroxine (SYNTHROID, LEVOTHROID) 100 MCG tablet, Take 100 mcg by mouth daily before breakfast. , Disp: , Rfl:  .  metFORMIN (GLUCOPHAGE) 500 MG tablet, Place 500 mg into feeding tube 2 (two) times daily with a meal., Disp: , Rfl:  .  metoprolol tartrate (LOPRESSOR) 25 MG tablet, Take 12.5 mg by  mouth 2 (two) times daily. , Disp: , Rfl:  .  ondansetron (ZOFRAN) 4 MG tablet, Place 4 mg into feeding tube every 8 (eight) hours as needed for nausea or vomiting., Disp: , Rfl:  .  polyethylene glycol (MIRALAX / GLYCOLAX) packet, Take 17 g by mouth daily., Disp: , Rfl:  .  rivaroxaban (XARELTO) 10 MG TABS tablet, Take 10 mg by mouth every evening. , Disp: , Rfl:  .  simvastatin (ZOCOR) 40 MG tablet, Take 40 mg by mouth daily. , Disp: , Rfl:  .  tamsulosin (FLOMAX) 0.4 MG CAPS capsule, Take 1 capsule (0.4 mg total) by mouth daily. (Patient not taking: Reported on 08/21/2017), Disp: 30 capsule, Rfl: 0 .  vitamin B-12 (CYANOCOBALAMIN) 1000 MCG tablet, Take 1,000 mcg by mouth daily., Disp: , Rfl:   Past Medical History: Past Medical History:  Diagnosis Date  . Anemia   . Bradycardia   . 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 Cancer of prostate ([place]) 2019  . Coronary artery disease    atherosclerosis of native coronary artery of native heart without angina pectoris  . Deaf    Left ear  . Dysphagia    history of d/t throat cancer  . Dysrhythmia    paroxysmal atrial fibrillation, bradycardia  . H/O syncope    several yrs ago  . History of  BPH   . Hypercholesteremia   . Hypertension   . Hypothyroidism    s/p radiation tx - throat CA  . Myocardial infarction ([place])    1996  . Neck stiffness    s/p radiation tx - Throat CA  . Seizures ([place])    last one 2 yrs ago. not medicated. d/t injury to frontal lobe  . Vertigo    no episodes 4-5 yrs/dizziness  . Wears hearing aid    Right ear/ deaf in left ear    Tobacco Use: Social History   Tobacco Use  Smoking Status Never Smoker  Smokeless Tobacco Never Used    Labs: Recent Review Flowsheet Data    There is no flowsheet data to display.       Exercise Target Goals: Exercise Program Goal: Individual exercise prescription set using results from initial 6 min walk test  and THRR while considering  patient's activity barriers and safety.   Exercise Prescription Goal: Initial exercise prescription builds to 30-45 minutes a day of aerobic activity, 2-3 days per week.  Home exercise guidelines will be given to patient during program as part of exercise prescription that the participant will acknowledge.  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 HR  106 bpm     Max Ex. BP  146/82     2 Minute Post BP  128/64        Oxygen Initial Assessment:   Oxygen Re-Evaluation:   Oxygen Discharge (Final Oxygen Re-Evaluation):   Initial Exercise Prescription: Initial Exercise Prescription - 03/05/18 1400      Date of Initial Exercise RX and Referring Provider   Date  03/05/18    Referring Provider  Jermaine Deter MD      Treadmill   MPH  2.2    Grade  0.5    Minutes  15    METs  2.84      NuStep   Level  2    SPM  80    Minutes  15    METs  2.5      REL-XR   Level  1    Speed  50    Minutes  15    METs  2.5      Prescription Details   Frequency (times per week)  2    Duration  Progress to 30 minutes of continuous aerobic  without signs/symptoms of physical distress      Intensity   THRR 40-80% of Max  Heartrate  108-133    Ratings of Perceived Exertion  11-13    Perceived Dyspnea  0-4      Progression   Progression  Continue progressive overload as per policy without signs/symptoms or physical distress.      Resistance Training   Training Prescription  Yes    Weight  3 lbs    Reps  10-15       Perform Capillary Blood Glucose checks as needed.  Exercise Prescription Changes: Exercise Prescription Changes    Row Name 03/05/18 1300 03/27/18 1600 03/28/18 1400 04/11/18 1100       Response to Exercise   Blood Pressure (Admit)  126/66  -  94/70  94/62    Blood Pressure (Exercise)  146/82  -  146/72  138/60    Blood Pressure (Exit)  128/64  -  106/60  126/70    Heart Rate (Admit)  93 bpm  -  95 bpm  89 bpm    Heart Rate (Exercise)  106 bpm  -  106 bpm  119 bpm    Heart Rate (Exit)  87 bpm  -  94 bpm  90 bpm    Oxygen Saturation (Admit)  98 %  -  -  -    Oxygen Saturation (Exercise)  94 %  -  -  -    Rating of Perceived Exertion (Exercise)  11  -  11  13    Symptoms  none  -  none  none    Comments  walk test results  -  -  -    Duration  -  -  Continue with 30 min of aerobic exercise without signs/symptoms of physical distress.  Continue with 30 min of aerobic exercise without signs/symptoms of physical distress.    Intensity  -  -  THRR unchanged  THRR unchanged      Progression   Progression  -  -  Continue to progress workloads to maintain intensity without signs/symptoms of physical distress.  Continue to progress workloads to maintain intensity without signs/symptoms of physical distress.    Average METs  -  -  2.64  2.68      Resistance Training   Training Prescription  -  -  Yes  Yes    Weight  -  -  3 lbs  3 lbs    Reps  -  -  10-15  10-15      Interval Training   Interval Training  -  -  No  No      Treadmill   MPH  -  -  2.2  2.2    Grade  -  -  0.5  0.5    Minutes  -  -  15  15    METs  -  -  2.84  2.84      NuStep   Level  -  -  2  2    Minutes  -  -   15  15    METs  -  -  2  2.3      REL-XR   Level  -  -  1  1    Minutes  -  -  15  15    METs  -  -  3.1  2.9      Home Exercise Plan  Plans to continue exercise at  -  Home (comment) walking and [employer]  Home (comment) walking and [employer]  Home (comment) walking and [employer]    Frequency  -  Add 3 additional days to program exercise sessions.  Add 3 additional days to program exercise sessions.  Add 3 additional days to program exercise sessions.    Initial Home Exercises Provided  -  03/27/18  03/27/18  03/27/18       Exercise Comments: Exercise Comments    Row Name 03/20/18 [phone]           Exercise Comments  First full day of exercise!  Patient was oriented to gym and equipment including functions, settings, policies, and procedures.  Patient's individual exercise prescription and treatment plan were reviewed.  All starting workloads were established based on the results of the 6 minute walk test done at initial orientation visit.  The plan for exercise progression was also introduced and progression will be customized based on patient's performance and goals.          Exercise Goals and Review: Exercise Goals    Row Name 03/05/18 1428             Exercise Goals   Increase Physical Activity  Yes       Intervention  Provide advice, education, support and counseling about physical activity/exercise needs.;Develop an individualized exercise prescription for aerobic and resistive training based on initial evaluation findings, risk stratification, comorbidities and participant's personal goals.       Expected Outcomes  Short Term: Attend rehab on a regular basis to increase amount of physical activity.;Long Term: Exercising regularly at least 3-5 days a week.;Long Term: Add in home exercise to make exercise part of routine and to increase amount of physical activity.       Increase Strength and Stamina  Yes       Intervention  Provide advice, education, support and counseling about  physical activity/exercise needs.;Develop an individualized exercise prescription for aerobic and resistive training based on initial evaluation findings, risk stratification, comorbidities and participant's personal goals.       Expected Outcomes  Short Term: Increase workloads from initial exercise prescription for resistance, speed, and METs.;Short Term: Perform resistance training exercises routinely during rehab and add in resistance training at home;Long Term: Improve cardiorespiratory fitness, muscular endurance and strength as measured by increased METs and functional capacity (6MWT)       Able to understand and use rate of perceived exertion (RPE) scale  Yes       Intervention  Provide education and explanation on how to use RPE scale       Expected Outcomes  Short Term: Able to use RPE daily in rehab to express subjective intensity level;Long Term:  Able to use RPE to guide intensity level when exercising independently       Knowledge and understanding of Target Heart Rate Range (THRR)  Yes       Intervention  Provide education and explanation of THRR including how the numbers were predicted and where they are located for reference       Expected Outcomes  Short Term: Able to state/look up THRR;Short Term: Able to use daily as guideline for intensity in rehab;Long Term: Able to use THRR to govern intensity when exercising independently       Able to check pulse independently  Yes       Intervention  Provide education and demonstration on how to check pulse in carotid and radial  arteries.;Review the importance of being able to check your own pulse for safety during independent exercise       Expected Outcomes  Long Term: Able to check pulse independently and accurately;Short Term: Able to explain why pulse checking is important during independent exercise       Understanding of Exercise Prescription  Yes       Intervention  Provide education, explanation, and written materials on patient's  individual exercise prescription       Expected Outcomes  Short Term: Able to explain program exercise prescription;Long Term: Able to explain home exercise prescription to exercise independently          Exercise Goals Re-Evaluation : Exercise Goals Re-Evaluation    Row Name 03/20/18 3267 03/27/18 1617 04/03/18 1016 04/11/18 1145       Exercise Goal Re-Evaluation   Exercise Goals Review  Increase Physical Activity;Increase Strength and Stamina;Knowledge and understanding of Target Heart Rate Range (THRR);Able to understand and use rate of perceived exertion (RPE) scale;Understanding of Exercise Prescription  Increase Physical Activity;Increase Strength and Stamina;Able to understand and use rate of perceived exertion (RPE) scale;Knowledge and understanding of Target Heart Rate Range (THRR);Able to check pulse independently;Understanding of Exercise Prescription  Increase Physical Activity;Increase Strength and Stamina;Understanding of Exercise Prescription  Increase Physical Activity;Increase Strength and Stamina;Understanding of Exercise Prescription    Comments  Reviewed RPE scale, THR and program prescription with pt today.  Pt voiced understanding and was given a copy of goals to take home.   Reviewed home exercise with pt today.  Pt plans to continue walking at home for exercise.  He also has access to the [facility] for exercise on bad weather days.  Reviewed THR, pulse, RPE, sign and symptoms, and when to call 911 or MD.  Also discussed weather considerations and indoor options.  Pt voiced understanding.  Jermaine Campbell has been doing well in rehab.  He has been getting his home exercise[facility] He is recovering his strength and stamina.   Jermaine Campbell continues to do well in rehab.  He will be ready to start to increase workloads upon return.  He is planning to walk and use videos at home.  We will continue to monitor his progress at home.     Expected Outcomes  Short: Use RPE daily to  regulate intensity. Long: Follow program prescription in THR.  Short: Start to add in walking at home on off days.  Long: Continue to improve strength and stamina.   Short: Continue to exercise on his off days.  Long: Continue to build his strength and stamina.   Short: Continue to exercise by walking.  Long: Continue to build strength and stamina.        Discharge Exercise Prescription (Final Exercise Prescription Changes): Exercise Prescription Changes - 04/11/18 1100      Response to Exercise   Blood Pressure (Admit)  94/62    Blood Pressure (Exercise)  138/60    Blood Pressure (Exit)  126/70    Heart Rate (Admit)  89 bpm    Heart Rate (Exercise)  119 bpm    Heart Rate (Exit)  90 bpm    Rating of Perceived Exertion (Exercise)  13    Symptoms  none    Duration  Continue with 30 min of aerobic exercise without signs/symptoms of physical distress.    Intensity  THRR unchanged      Progression   Progression  Continue to progress workloads to maintain  intensity without signs/symptoms of physical distress.    Average METs  2.68      Resistance Training   Training Prescription  Yes    Weight  3 lbs    Reps  10-15      Interval Training   Interval Training  No      Treadmill   MPH  2.2    Grade  0.5    Minutes  15    METs  2.84      NuStep   Level  2    Minutes  15    METs  2.3      REL-XR   Level  1    Minutes  15    METs  2.9      Home Exercise Plan   Plans to continue exercise at  Home (comment)   walking and [employer]   Frequency  Add 3 additional days to program exercise sessions.    Initial Home Exercises Provided  03/27/18       Nutrition:  Target Goals: Understanding of nutrition guidelines, daily intake of sodium '1500mg'$ , cholesterol '200mg'$ , calories 30% from fat and 7% or less from saturated fats, daily to have 5 or more servings of fruits and vegetables.  Biometrics: Pre Biometrics - 03/05/18 1431      Pre Biometrics   Height  '5\' 9"'$  (1.753 m)    Weight   171 lb 1.6 oz (77.6 kg)    Waist Circumference  37 inches    Hip Circumference  42 inches    Waist to Hip Ratio  0.88 %    BMI (Calculated)  25.26    Single Leg Stand  2.69 seconds        Nutrition Therapy Plan and Nutrition Goals: Nutrition Therapy & Goals - 03/05/18 1320      Nutrition Therapy   Diet  Jermaine Campbell is on G Tube feedings. HIs wife will be consulting with the RD that has set up the tube feedings.       Intervention Plan   Intervention  Prescribe, educate and counsel regarding individualized specific dietary modifications aiming towards targeted core components such as weight, hypertension, lipid management, diabetes, heart failure and other comorbidities.    Expected Outcomes  Short Term Goal: Understand basic principles of dietary content, such as calories, fat, sodium, cholesterol and nutrients.;Short Term Goal: A plan has been developed with personal nutrition goals set during dietitian appointment.;Long Term Goal: Adherence to prescribed nutrition plan.       Nutrition Assessments:   Nutrition Goals Re-Evaluation: Nutrition Goals Re-Evaluation    Row Name 04/03/18 1019             Goals   Comment  Jermaine Campbell is currently on a feeding tube since surgery.  He uses a nutritional paste.  He does not feel hungry or lack in appetite.        Expected Outcome  Short: Continue to on feeding tube.  Long: Continue with healthy diet.           Nutrition Goals Discharge (Final Nutrition Goals Re-Evaluation): Nutrition Goals Re-Evaluation - 04/03/18 1019      Goals   Comment  Jermaine Campbell is currently on a feeding tube since surgery.  He uses a nutritional paste.  He does not feel hungry or lack in appetite.     Expected Outcome  Short: Continue to on feeding tube.  Long: Continue with healthy diet.        Psychosocial: Target Goals: Acknowledge  presence or absence of significant depression and/or stress, maximize coping skills, provide positive support system. Participant is able to  verbalize types and ability to use techniques and skills needed for reducing stress and depression.   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 He has multiple medical issues to work through before then. He scored a 9 on the PHQ9 and states that he is not able to get to sleep easily unless he uses the Xanax RX.       Family Dynamics   Good Support System?  Yes   Wife(Caregiver) and  his pastor     Barriers   Psychosocial barriers to participate in program  The patient should benefit from training in stress management and relaxation.;There are no identifiable barriers or psychosocial needs.      Screening Interventions   Interventions  Encouraged to exercise;To provide support and resources with identified psychosocial needs;Provide feedback about the scores to participant    Expected Outcomes  Short Term goal: Utilizing psychosocial counselor, staff and physician to assist with identification of specific Stressors or current issues interfering with healing process. Setting desired goal for each stressor or current issue identified.;Long Term Goal: Stressors or current issues are controlled or eliminated.;Short Term goal: Identification and review with participant of any Quality of Life or Depression concerns found by scoring the questionnaire.;Long Term goal: The participant improves quality of Life and PHQ9 Scores as seen by post scores and/or verbalization of changes       Quality of Life Scores:  Quality of Life - 03/05/18 1320      Quality of Life   Select  Quality of Life      Quality of Life Scores   Health/Function Pre  18.23 %    Socioeconomic Pre  24.86 %     Psych/Spiritual Pre  22.93 %    Family Pre  28.8 %    GLOBAL Pre  22.12 %      Scores of 19 and below usually indicate a poorer quality of life in these areas.  A difference of  2-3 points is a clinically meaningful difference.  A difference of 2-3 points in the total score of the Quality of Life Index has been associated with significant improvement in overall quality of life, self-image, physical symptoms, and general health in studies assessing change in quality of life.  PHQ-9: Recent Review Flowsheet Data    Depression screen [facility] 2/9 04/03/2018 03/05/2018   Decreased Interest 0 3   Down, Depressed, Hopeless 0 1   PHQ - 2 Score 0 4   Altered sleeping 0 2   Tired, decreased energy 1 3   Change in appetite 0 0   Feeling bad or failure about yourself  0 0   Trouble concentrating 1 0   Moving slowly or fidgety/restless 1 0   Suicidal thoughts 0 0   PHQ-9 Score 3 9   Difficult doing work/chores Somewhat difficult Somewhat difficult     Interpretation of Total Score  Total Score Depression Severity:  1-4 = Minimal depression, 5-9 = Mild depression, 10-14 = Moderate depression, 15-19 = Moderately severe depression, 20-27 =  Severe depression   Psychosocial Evaluation and Intervention: Psychosocial Evaluation - 03/22/18 1032      Psychosocial Evaluation & Interventions   Interventions  Stress management education;Encouraged to exercise with the program and follow exercise prescription    Comments  Counselor met with client who self-reported on 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  He observed differences in recovery ability from first to second procedure.  His primary support is his wife Shauna Hugh; their two daughters live in [place].  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  He spoke about physical weakness since surgery.  He reports that his sleeping is better then it was but believes he averages 5 to 6 hours a night.  Appetite is an  issue which is long-standing, has a stomach tube which has been in place for years.  Primary stressors include impact of surgery on physical abilities and difficulty communicating due to hearing issues. Counselor encouraged patience in recovery process.  Staff will follow.    Expected Outcomes  Short term goal: Focus on physical issues of building/increasing strength, stamina, and energy.  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 Previously, patients score indicated signs/symptoms of depression.  Reviewed to see if patient is improving symptom wise while in program.  Score improved and patient states that it is because they have been getting stronger and back to normal.  He continues to worry about his arm and his health recovery as he would rather be able to just get out and go.  He is sleeping better but still waking but now able to get back to sleep.        Expected Outcomes  Short: Continue to attend rehab regularly for regular exercise and social engagement. Long: Continue to improve symptoms and manage a positive mental state.       Interventions  Stress management education;Encouraged to attend Cardiac Rehabilitation for the exercise       Continue Psychosocial Services   Follow up required by staff          Psychosocial Discharge (Final Psychosocial Re-Evaluation): Psychosocial Re-Evaluation - 04/03/18 1015      Psychosocial Re-Evaluation   Current issues with  Current Stress Concerns;Current Depression;Current Sleep Concerns    Comments  Reviewed patient health questionnaire (PHQ-9) with patient for  follow up. Previously, patients score indicated signs/symptoms of depression.  Reviewed to see if patient is improving symptom wise while in program.  Score improved and patient states that it is because they have been getting stronger and back to normal.  He continues to worry about his arm and his health recovery as he would rather be able to just get out and go.  He is sleeping better but still waking but now able to get back to sleep.     Expected Outcomes  Short: Continue to attend rehab regularly for regular exercise and social engagement. Long: Continue to improve symptoms and manage a positive mental state.    Interventions  Stress management education;Encouraged to attend Cardiac Rehabilitation for the exercise    Continue Psychosocial Services   Follow up required by staff       Vocational Rehabilitation: Provide vocational rehab assistance to qualifying candidates.   Vocational Rehab Evaluation & Intervention: Vocational Rehab - 03/05/18 1323      Initial Vocational Rehab Evaluation & Intervention   Assessment shows need for Vocational Rehabilitation  No       Education: Education Goals: Education classes will be provided on a variety of topics geared toward better understanding of heart health and risk factor modification. Participant will state understanding/return demonstration of topics presented as noted by education test scores.  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   Cardiac Rehab from 04/05/2018 in [facility] Cardiac and Pulmonary Rehab  Date  03/20/18  Educator  SB  Instruction  Review Code  1- Verbalizes Understanding      General Nutrition Guidelines/Fats and Fiber: -Group instruction provided by verbal, written material, models and posters to present the general guidelines for heart healthy nutrition. Gives an explanation and review of dietary fats and fiber.   Cardiac Rehab from 04/05/2018 in [facility] Cardiac and Pulmonary Rehab  Date  04/03/18  Educator  [facility]  Instruction Review Code  1- Verbalizes Understanding      Controlling Sodium/Reading Food Labels: -Group verbal and written material supporting the discussion of sodium use in heart healthy nutrition. Review and explanation with models, verbal and written materials for utilization of the food label.   Cardiac Rehab from 04/05/2018 in [facility] Cardiac and Pulmonary Rehab  Date  04/05/18  Educator  [facility]  Instruction Review Code  1- Verbalizes Understanding      Exercise Physiology & General Exercise Guidelines: - Group verbal and written instruction with models to review the exercise physiology of the cardiovascular system and associated critical values. Provides general exercise guidelines with specific guidelines to those with heart or lung disease.    Aerobic Exercise & Resistance Training: - Gives group verbal and written instruction on the various components of exercise. Focuses on aerobic and resistive training programs and the benefits of this training and how to safely progress through these programs..   Flexibility, Balance, Mind/Body Relaxation: Provides group verbal/written instruction on the benefits of flexibility and balance training, including mind/body exercise modes such as yoga, pilates and tai chi.  Demonstration and skill practice provided.   Stress and Anxiety: - Provides group verbal and written instruction about the health risks of elevated stress and causes of high stress.  Discuss the correlation between heart/lung disease and anxiety and treatment options. Review healthy ways to manage  with stress and anxiety.   Depression: - Provides group verbal and written instruction on the correlation between heart/lung disease and depressed mood, treatment options, and the stigmas associated with seeking treatment.   Cardiac Rehab from 04/05/2018 in [facility] Cardiac and Pulmonary Rehab  Date  03/27/18  Educator  [facility]  Instruction Review Code  1- Verbalizes Understanding      Anatomy & Physiology of the Heart: - Group verbal and written instruction and models provide basic  cardiac anatomy and physiology, with the coronary electrical and arterial systems. Review of Valvular disease and Heart Failure   Cardiac Procedures: - Group verbal and written instruction to review commonly prescribed medications for heart disease. Reviews the medication, class of the drug, and side effects. Includes the steps to properly store meds and maintain the prescription regimen. (beta blockers and nitrates)   Cardiac Medications I: - Group verbal and written instruction to review commonly prescribed medications for heart disease. Reviews the medication, class of the drug, and side effects. Includes the steps to properly store meds and maintain the prescription regimen.   Cardiac Medications II: -Group verbal and written instruction to review commonly prescribed medications for heart disease. Reviews the medication, class of the drug, and side effects. (all other drug classes)   Cardiac Rehab from 04/05/2018 in [facility] Cardiac and Pulmonary Rehab  Date  03/29/18  Educator  CE  Instruction Review Code  1- Verbalizes Understanding       Go Sex-Intimacy & Heart Disease, Get SMART - Goal Setting: - Group verbal and written instruction through game format to discuss heart disease and the return to sexual intimacy. Provides group verbal and written material to discuss and apply goal setting through the application of the S.M.A.R.T. Method.   Other Matters of the Heart: - Provides group verbal, written  materials and models to describe Stable Angina and Peripheral Artery. Includes description of the disease process and treatment options available to the cardiac patient.   Exercise & Equipment Safety: - Individual verbal instruction and demonstration of equipment use and safety with use of the equipment.   Cardiac Rehab from 04/05/2018 in [facility] Cardiac and Pulmonary Rehab  Date  03/05/18  Educator  [facility]  Instruction Review Code  1- Verbalizes Understanding      Infection Prevention: - Provides verbal and written material to individual with discussion of infection control including proper hand washing and proper equipment cleaning during exercise session.   Cardiac Rehab from 04/05/2018 in [facility] Cardiac and Pulmonary Rehab  Date  03/05/18  Educator  [facility]  Instruction Review Code  1- Verbalizes Understanding      Falls Prevention: - Provides verbal and written material to individual with discussion of falls prevention and safety.   Cardiac Rehab from 04/05/2018 in [facility] Cardiac and Pulmonary Rehab  Date  03/05/18  Educator  [facility]  Instruction Review Code  1- Verbalizes Understanding      Diabetes: - Individual verbal and written instruction to review signs/symptoms of diabetes, desired ranges of glucose level fasting, after meals and with exercise. Acknowledge that pre and post exercise glucose checks will be done for 3 sessions at entry of program.   Cardiac Rehab from 04/05/2018 in [facility] Cardiac and Pulmonary Rehab  Date  03/05/18  Educator  SB  Instruction Review Code  1- Verbalizes Understanding      Know Your Numbers and Risk Factors: -Group verbal and written instruction about important numbers in your health.  Discussion of what are risk factors and how they play a role in the disease process.  Review of Cholesterol, Blood Pressure, Diabetes, and BMI and the role they play in your overall health.   Cardiac Rehab from 04/05/2018 in [facility] Cardiac and Pulmonary Rehab  Date  03/29/18   Educator  CE  Instruction Review Code  1- Verbalizes Understanding      Sleep Hygiene: -Provides group verbal and written instruction about how sleep can affect your health.  Define sleep hygiene, discuss sleep cycles  and impact of sleep habits. Review good sleep hygiene tips.    Other: -Provides group and verbal instruction on various topics (see comments)   Knowledge Questionnaire Score: Knowledge Questionnaire Score - 03/05/18 1322      Knowledge Questionnaire Score   Pre Score  24/26   reviewed correct responses with Jermaine Campbell and his wife. They verbalized understanding of the responses and had no further questions today.       Core Components/Risk Factors/Patient Goals at Admission: Personal Goals and Risk Factors at Admission - 03/05/18 1323      Core Components/Risk Factors/Patient Goals on Admission    Weight Management  Weight Gain;Yes    Intervention  Weight Management: Develop a combined nutrition and exercise program designed to reach desired caloric intake, while maintaining appropriate intake of nutrient and fiber, sodium and fats, and appropriate energy expenditure required for the weight goal.;Weight Management: Provide education and appropriate resources to help participant work on and attain dietary goals.    Admit Weight  171 lb (77.6 kg)    Goal Weight: Short Term  173 lb (78.5 kg)    Goal Weight: Long Term  176 lb (79.8 kg)    Expected Outcomes  Short Term: Continue to assess and modify interventions until short term weight is achieved;Long Term: Adherence to nutrition and physical activity/exercise program aimed toward attainment of established weight goal   DAve and his wife will talk with RD that has prescribed the tube feedings to see how to increase caloric intake.    Diabetes  Yes   The tube feedings that Jermaine Campbell has been using have increased his blood glucose levels.    Intervention  Provide education about signs/symptoms and action to take for  hypo/hyperglycemia.;Provide education about proper nutrition, including hydration, and aerobic/resistive exercise prescription along with prescribed medications to achieve blood glucose in normal ranges: Fasting glucose 65-99 mg/dL    Expected Outcomes  Short Term: Participant verbalizes understanding of the signs/symptoms and immediate care of hyper/hypoglycemia, proper foot care and importance of medication, aerobic/resistive exercise and nutrition plan for blood glucose control.;Long Term: Attainment of HbA1C < 7%.    Lipids  Yes    Intervention  Provide education and support for participant on nutrition & aerobic/resistive exercise along with prescribed medications to achieve LDL '70mg'$ , HDL >'40mg'$ .    Expected Outcomes  Short Term: Participant states understanding of desired cholesterol values and is compliant with medications prescribed. Participant is following exercise prescription and nutrition guidelines.;Long Term: Cholesterol controlled with medications as prescribed, with individualized exercise RX and with personalized nutrition plan. Value goals: LDL < '70mg'$ , HDL > 40 mg.       Core Components/Risk Factors/Patient Goals Review:  Goals and Risk Factor Review    Row Name 04/03/18 1022             Core Components/Risk Factors/Patient Goals Review   Personal Goals Review  Weight Management/Obesity;Hypertension;Diabetes;Lipids       Review  Jermaine Campbell has been doing well in rehab. His weight has steady and he is not losing any more. He and his wife monitoring his medications and diet (including fluids) closely.  His pressures have been good at home and they check daily.  He is doing well with his blood sugars and he checks them at home 2-3x a week.  He is feeling well overall.  He does not enjoy his tube, but makes the best of it.        Expected Outcomes  Short: Continue to monitor weight  closely to make sure he doesn't lose.  Long: Continue to monitor his risk factors.           Core  Components/Risk Factors/Patient Goals at Discharge (Final Review):  Goals and Risk Factor Review - 04/03/18 1022      Core Components/Risk Factors/Patient Goals Review   Personal Goals Review  Weight Management/Obesity;Hypertension;Diabetes;Lipids    Review  Jermaine Campbell has been doing well in rehab. His weight has steady and he is not losing any more. He and his wife monitoring his medications and diet (including fluids) closely.  His pressures have been good at home and they check daily.  He is doing well with his blood sugars and he checks them at home 2-3x a week.  He is feeling well overall.  He does not enjoy his tube, but makes the best of it.     Expected Outcomes  Short: Continue to monitor weight closely to make sure he doesn't lose.  Long: Continue to monitor his risk factors.        ITP Comments: ITP Comments    Row Name 03/05/18 1225 03/21/18 0612 04/11/18 1142 04/18/18 1218     ITP Comments  Medical review completed today. ITP sent to Dr. Loleta Chance for review,changes as needed and signature. Documentation of diagnosis can be found in [place] 1/22 office visit  30 day review. Continue with ITP unless directed changes by Medical Director chart review. New to program.  Our program is currently closed due to COVID-19.  We are communicating with patient via phone calls and emails.    30 day review. Continue with ITP unless directed changes by Medical Director chart review.       Comments:

## 2018-04-24 ENCOUNTER — Other Ambulatory Visit: Payer: Self-pay

## 2018-04-24 ENCOUNTER — Emergency Department
Admission: EM | Admit: 2018-04-24 | Discharge: 2018-04-24 | Disposition: A | Discharge status: Home / Self Care | Payer: Medicare Other | Attending: Emergency Medicine | Admitting: Emergency Medicine

## 2018-04-24 ENCOUNTER — Encounter: Payer: Self-pay | Admitting: *Deleted

## 2018-04-24 DIAGNOSIS — K9423 Gastrostomy malfunction: Secondary | ICD-10-CM | POA: Diagnosis present

## 2018-04-24 DIAGNOSIS — Z923 Personal history of irradiation: Secondary | ICD-10-CM | POA: Insufficient documentation

## 2018-04-24 DIAGNOSIS — Z7901 Long term (current) use of anticoagulants: Secondary | ICD-10-CM | POA: Insufficient documentation

## 2018-04-24 DIAGNOSIS — Z931 Gastrostomy status: Secondary | ICD-10-CM | POA: Diagnosis not present

## 2018-04-24 DIAGNOSIS — Z79899 Other long term (current) drug therapy: Secondary | ICD-10-CM | POA: Insufficient documentation

## 2018-04-24 DIAGNOSIS — I2510 Atherosclerotic heart disease of native coronary artery without angina pectoris: Secondary | ICD-10-CM | POA: Insufficient documentation

## 2018-04-24 DIAGNOSIS — Z8521 Personal history of malignant neoplasm of larynx: Secondary | ICD-10-CM | POA: Insufficient documentation

## 2018-04-24 DIAGNOSIS — I10 Essential (primary) hypertension: Secondary | ICD-10-CM | POA: Diagnosis not present

## 2018-04-24 DIAGNOSIS — E039 Hypothyroidism, unspecified: Secondary | ICD-10-CM | POA: Diagnosis not present

## 2018-04-24 DIAGNOSIS — Z7984 Long term (current) use of oral hypoglycemic drugs: Secondary | ICD-10-CM | POA: Insufficient documentation

## 2018-04-24 DIAGNOSIS — Z8546 Personal history of malignant neoplasm of prostate: Secondary | ICD-10-CM | POA: Insufficient documentation

## 2018-04-24 DIAGNOSIS — Z951 Presence of aortocoronary bypass graft: Secondary | ICD-10-CM

## 2018-04-24 DIAGNOSIS — I252 Old myocardial infarction: Secondary | ICD-10-CM | POA: Insufficient documentation

## 2018-04-24 NOTE — ED Provider Notes (Signed)
[facility] Emergency Department Provider Note  ____________________________________________   I have reviewed the triage vital signs and the nursing notes.   HISTORY  Chief Complaint gtube problem and GI Bleeding   History limited by: Not Limited   HPI Jermaine Campbell is a 73 y.o. male who presents to the emergency department today per GI doctor's office direction because of concern for possible GI bleeding. The patient does have a g tube which he uses for feeds and medication. He states that one week ago he noticed that the distal end of it turned black. It has not extended since then. He has not had any trouble with his feeds since then. He has not noticed any black tarry or bloody stool. When he discussed the change in the end of his g-tube to the GI doctors office they had concern for possible GI bleed. He denies any new weakness, shortness of breath or other symptoms.    Records reviewed. Per medical record review patient has a history of g-tube placement in December.   Past Medical History:  Diagnosis Date  . Anemia   . Bradycardia   . 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 Cancer of prostate ([place]) 2019  . Coronary artery disease    atherosclerosis of native coronary artery of native heart without angina pectoris  . Deaf    Left ear  . Dysphagia    history of d/t throat cancer  . Dysrhythmia    paroxysmal atrial fibrillation, bradycardia  . H/O syncope    several yrs ago  . History of BPH   . Hypercholesteremia   . Hypertension   . Hypothyroidism    s/p radiation tx - throat CA  . Myocardial infarction ([place])    1996  . Neck stiffness    s/p radiation tx - Throat CA  . Seizures ([place])    last one 2 yrs ago. not medicated. d/t injury to frontal lobe  . Vertigo    no episodes 4-5 yrs/dizziness  . Wears hearing aid    Right ear/ deaf in left ear    Patient Active Problem List    Diagnosis Date Noted  . Elevated PSA 12/28/2016  . BPH with obstruction/lower urinary tract symptoms 12/28/2016    Past Surgical History:  Procedure Laterality Date  . BICEPT TENODESIS Right 01/26/2016   Procedure: BICEPS TENODESIS- open;  Surgeon: Corky Mull, MD;  Location: [facility] ORS;  Service: Orthopedics;  Laterality: Right;  . CARDIAC CATHETERIZATION     before CABG/ DR PARACHOS IS CARDIOLOGIST  . CATARACT EXTRACTION W/PHACO Left 07/30/2014   Procedure: CATARACT EXTRACTION PHACO AND INTRAOCULAR LENS PLACEMENT ([place]) SHUGARCAINE;  Surgeon: Leandrew Koyanagi, MD;  Location: [place];  Service: Ophthalmology;  Laterality: Left;  SHUGARCAINE  . CATARACT EXTRACTION W/PHACO Right 10/29/2014   Procedure: CATARACT EXTRACTION PHACO AND INTRAOCULAR LENS PLACEMENT ([facility]);  Surgeon: Leandrew Koyanagi, MD;  Location: [place];  Service: Ophthalmology;  Laterality: Right;  [place]  . COLONOSCOPY    . COLONOSCOPY WITH PROPOFOL N/A 04/14/2017   Procedure: COLONOSCOPY WITH PROPOFOL;  Surgeon: Manya Silvas, MD;  Location: [facility];  Service: Endoscopy;  Laterality: N/A;  . CORONARY ARTERY BYPASS GRAFT  1996   3 vessel  . EYE SURGERY Bilateral 2015   cataract extractions  . FRACTURE SURGERY     right arm. as a child  . HOLEP-LASER ENUCLEATION OF THE PROSTATE  WITH MORCELLATION N/A 08/23/2017   Procedure: HOLEP-LASER ENUCLEATION OF THE PROSTATE WITH MORCELLATION;  Surgeon: Hollice Espy, MD;  Location: [facility] ORS;  Service: Urology;  Laterality: N/A;  . OPEN SUBSCAPULARIS REPAIR Right 01/26/2016   Procedure: arthroscopic  SUBSCAPULARIS REPAIR;  Surgeon: Corky Mull, MD;  Location: [facility] ORS;  Service: Orthopedics;  Laterality: Right;  . SHOULDER ARTHROSCOPY WITH OPEN ROTATOR CUFF REPAIR Right 01/26/2016   Procedure: SHOULDER ARTHROSCOPY WITH MINI OPEN ROTATOR CUFF REPAIR;  Surgeon: Corky Mull, MD;  Location: [facility] ORS;  Service: Orthopedics;  Laterality: Right;  . SHOULDER  ARTHROSCOPY WITH SUBACROMIAL DECOMPRESSION Right 01/26/2016   Procedure: SHOULDER ARTHROSCOPY WITH SUBACROMIAL DECOMPRESSION and debridement;  Surgeon: Corky Mull, MD;  Location: [facility] ORS;  Service: Orthopedics;  Laterality: Right;  . THROAT SURGERY     excision - cancer  . TONSILLECTOMY      Prior to Admission medications   Medication Sig Start Date End Date Taking? Authorizing Provider  ALPRAZolam Duanne Moron) 0.5 MG tablet Place 0.25 mg into feeding tube at bedtime as needed for sleep.    [provider]  atorvastatin (LIPITOR) 80 MG tablet Place 80 mg into feeding tube daily.    [provider]  bicalutamide (CASODEX) 50 MG tablet Take 50 mg by mouth daily. 08/07/17   [provider]  docusate sodium (COLACE) 100 MG capsule Take 1 capsule (100 mg total) by mouth 2 (two) times daily. Patient not taking: Reported on 03/05/2018 08/23/17   Hollice Espy, MD  fluticasone [facility]) 50 MCG/ACT nasal spray Place 1 spray into both nostrils as needed.     [provider]  HYDROcodone-acetaminophen (NORCO/VICODIN) 5-325 MG tablet Take 1-2 tablets by mouth every 6 (six) hours as needed for moderate pain. Patient not taking: Reported on 03/05/2018 08/23/17   Hollice Espy, MD  ibuprofen (ADVIL,MOTRIN) 200 MG tablet Take 200 mg by mouth every 6 (six) hours as needed.    [provider]  isosorbide dinitrate (ISORDIL) 10 MG tablet Place 0.5 mg into feeding tube 2 (two) times daily.    [provider]  leuprolide (LUPRON) 1 MG/0.2ML injection Inject into the skin every 3 (three) months.     [provider]  levothyroxine (SYNTHROID, LEVOTHROID) 100 MCG tablet Take 100 mcg by mouth daily before breakfast.     [provider]  metFORMIN (GLUCOPHAGE) 500 MG tablet Place 500 mg into feeding tube 2 (two) times daily with a meal.    [provider]  metoprolol tartrate (LOPRESSOR) 25 MG tablet Take 12.5 mg by mouth 2 (two) times daily.      [provider]  ondansetron (ZOFRAN) 4 MG tablet Place 4 mg into feeding tube every 8 (eight) hours as needed for nausea or vomiting.    [provider]  polyethylene glycol (MIRALAX / GLYCOLAX) packet Take 17 g by mouth daily.    [provider]  rivaroxaban (XARELTO) 10 MG TABS tablet Take 10 mg by mouth every evening.     [provider]  simvastatin (ZOCOR) 40 MG tablet Take 40 mg by mouth daily.     [provider]  tamsulosin (FLOMAX) 0.4 MG CAPS capsule Take 1 capsule (0.4 mg total) by mouth daily. Patient not taking: Reported on 08/21/2017 08/07/17   Abbie Sons, MD  vitamin B-12 (CYANOCOBALAMIN) 1000 MCG tablet Take 1,000 mcg by mouth daily.    [provider]    Allergies Tamsulosin hcl  Family History  Problem Relation Age of  Onset  . Heart attack Father     Social History Social History   Tobacco Use  . Smoking status: Never Smoker  . Smokeless tobacco: Never Used  Substance Use Topics  . Alcohol use: No    Comment: rarely  . Drug use: No    Review of Systems Constitutional: No fever/chills Eyes: No visual changes. ENT: No sore throat. Cardiovascular: Denies chest pain. Respiratory: Denies shortness of breath. Gastrointestinal: No abdominal pain.  No nausea, no vomiting.  No diarrhea.   Genitourinary: Negative for dysuria. Musculoskeletal: Negative for back pain. Skin: Negative for rash. Neurological: Negative for headaches, focal weakness or numbness.  ____________________________________________   PHYSICAL EXAM:  VITAL SIGNS: ED Triage Vitals  Enc Vitals Group     BP 04/24/18 1725 118/79     Pulse Rate 04/24/18 1725 87     Resp 04/24/18 1725 20     Temp 04/24/18 1725 98.3 F (36.8 C)     Temp Source 04/24/18 1725 Oral     SpO2 04/24/18 1725 100 %     Weight 04/24/18 1726 169 lb (76.7 kg)     Height 04/24/18 1726 5\' 9"  (1.753 m)   Constitutional: Alert and oriented.  Eyes: Conjunctivae  are normal.  ENT      Head: Normocephalic and atraumatic.      Nose: No congestion/rhinnorhea.      Mouth/Throat: Mucous membranes are moist.      Neck: No stridor. Cardiovascular: Normal rate, regular rhythm.  No murmurs, rubs, or gallops.  Respiratory: Normal respiratory effort without tachypnea nor retractions. Breath sounds are clear and equal bilaterally. No wheezes/rales/rhonchi. Gastrointestinal: G-tube in place. Distal 5 cm with a black color to it.  Genitourinary: Deferred Musculoskeletal: Normal range of motion in all extremities. No lower extremity edema. Neurologic:  Normal speech and language. No gross focal neurologic deficits are appreciated.  Skin:  Skin is warm, dry and intact. No rash noted. Psychiatric: Mood and affect are normal. Speech and behavior are normal. Patient exhibits appropriate insight and judgment.  ____________________________________________    LABS (pertinent positives/negatives)  None  ____________________________________________   EKG  None  ____________________________________________    RADIOLOGY  None  ____________________________________________   PROCEDURES  Procedures  ____________________________________________   INITIAL IMPRESSION / ASSESSMENT AND PLAN / ED COURSE  Pertinent labs & imaging results that were available during my care of the patient were reviewed by me and considered in my medical decision making (see chart for details).   Patient presented today at the suggestion of GI office because of concern for GI bleed. Patient describes noticing that the distal end of his g-tube turned black roughly 1 week ago and on exam the distal roughly 5 cm are black. However he has not had any problem with his feedings. Has not noticed any black, tarry or bloody stool. Did offer to perform rectal exam which patient declined. At this point discussed with patient importance of following up with GI.     ____________________________________________   FINAL CLINICAL IMPRESSION(S) / ED DIAGNOSES  Final diagnoses:  Gastrostomy tube in place [facility])     Note: This dictation was prepared with Dragon dictation. Any transcriptional errors that result from this process are unintentional     Nance Pear, MD 04/24/18 2137

## 2018-04-24 NOTE — Discharge Instructions (Addendum)
Please seek medical attention for any high fevers, chest pain, shortness of breath, change in behavior, persistent vomiting, bloody stool or any other new or concerning symptoms.  

## 2018-04-24 NOTE — ED Triage Notes (Signed)
Pt sent to ER for eval of gtube.  Pt has "black stuff" in tube and doctor is concerned of gi bleed.  Pt is on xarelto.  No abd pain.  No v/d  Pt alert.

## 2018-04-24 NOTE — ED Notes (Signed)
AAOx3.  Skin warm and dry.  NAD 

## 2018-05-21 ENCOUNTER — Encounter: Payer: Self-pay | Admitting: *Deleted

## 2018-05-21 DIAGNOSIS — Z951 Presence of aortocoronary bypass graft: Secondary | ICD-10-CM

## 2018-06-11 ENCOUNTER — Encounter: Payer: Self-pay | Admitting: *Deleted

## 2018-06-11 DIAGNOSIS — Z951 Presence of aortocoronary bypass graft: Secondary | ICD-10-CM

## 2018-06-11 DIAGNOSIS — C439 Malignant melanoma of skin, unspecified: Secondary | ICD-10-CM

## 2018-06-11 HISTORY — DX: Malignant melanoma of skin, unspecified: C43.9

## 2018-06-19 DIAGNOSIS — Z951 Presence of aortocoronary bypass graft: Secondary | ICD-10-CM

## 2018-07-09 DIAGNOSIS — Z85819 Personal history of malignant neoplasm of unspecified site of lip, oral cavity, and pharynx: Secondary | ICD-10-CM | POA: Insufficient documentation

## 2018-07-16 DIAGNOSIS — Z9889 Other specified postprocedural states: Secondary | ICD-10-CM | POA: Insufficient documentation

## 2018-08-02 ENCOUNTER — Other Ambulatory Visit: Payer: Self-pay

## 2018-08-02 ENCOUNTER — Encounter: Payer: Medicare Other | Attending: Cardiothoracic Surgery

## 2018-08-02 DIAGNOSIS — Z951 Presence of aortocoronary bypass graft: Secondary | ICD-10-CM | POA: Diagnosis not present

## 2018-08-02 DIAGNOSIS — Z7901 Long term (current) use of anticoagulants: Secondary | ICD-10-CM | POA: Diagnosis not present

## 2018-08-02 DIAGNOSIS — Z7984 Long term (current) use of oral hypoglycemic drugs: Secondary | ICD-10-CM | POA: Diagnosis not present

## 2018-08-02 DIAGNOSIS — E669 Obesity, unspecified: Secondary | ICD-10-CM | POA: Insufficient documentation

## 2018-08-02 DIAGNOSIS — R1310 Dysphagia, unspecified: Secondary | ICD-10-CM | POA: Diagnosis not present

## 2018-08-02 DIAGNOSIS — I10 Essential (primary) hypertension: Secondary | ICD-10-CM | POA: Insufficient documentation

## 2018-08-02 DIAGNOSIS — E7800 Pure hypercholesterolemia, unspecified: Secondary | ICD-10-CM | POA: Insufficient documentation

## 2018-08-02 DIAGNOSIS — Z6825 Body mass index (BMI) 25.0-25.9, adult: Secondary | ICD-10-CM | POA: Insufficient documentation

## 2018-08-02 DIAGNOSIS — I480 Paroxysmal atrial fibrillation: Secondary | ICD-10-CM | POA: Insufficient documentation

## 2018-08-02 DIAGNOSIS — H9192 Unspecified hearing loss, left ear: Secondary | ICD-10-CM | POA: Diagnosis not present

## 2018-08-02 DIAGNOSIS — E119 Type 2 diabetes mellitus without complications: Secondary | ICD-10-CM | POA: Diagnosis not present

## 2018-08-02 DIAGNOSIS — E039 Hypothyroidism, unspecified: Secondary | ICD-10-CM | POA: Insufficient documentation

## 2018-08-02 DIAGNOSIS — I252 Old myocardial infarction: Secondary | ICD-10-CM | POA: Insufficient documentation

## 2018-08-02 DIAGNOSIS — D649 Anemia, unspecified: Secondary | ICD-10-CM | POA: Insufficient documentation

## 2018-08-02 DIAGNOSIS — Z79899 Other long term (current) drug therapy: Secondary | ICD-10-CM | POA: Diagnosis not present

## 2018-08-02 NOTE — Progress Notes (Signed)
Daily Session Note  Patient Details  Name: Jermaine Campbell MRN: [id number] Date of Birth: 1945-02-21 Referring Provider:     Cardiac Rehab from 03/05/2018 in [facility] Cardiac and Pulmonary Rehab  Referring Provider  Dwaine Deter MD      Encounter Date: 08/02/2018  Check In: Session Check In - 08/02/18 1054      Check-In   Supervising physician immediately available to respond to emergencies  See telemetry face sheet for immediately available ER MD    Location  [facility]-Cardiac & Pulmonary Rehab    Staff Present  Jasper Loser BS, Exercise Physiologist;Melissa Caiola RDN, LDN;Susanne Bice, RN, BSN, CCRP;Joseph Alcus Dad, RN BSN    Virtual Visit  No    Medication changes reported      Yes   patient states he is taking less pills now than he was.   Comments  updated med list patient is taking is    Fall or balance concerns reported     No    Warm-up and Cool-down  Performed on first and last piece of equipment    Resistance Training Performed  Yes    VAD Patient?  No    PAD/SET Patient?  No      Pain Assessment   Currently in Pain?  No/denies    Multiple Pain Sites  No          Social History   Tobacco Use  Smoking Status Never Smoker  Smokeless Tobacco Never Used    Goals Met:  Independence with exercise equipment Exercise tolerated well No report of cardiac concerns or symptoms Strength training completed today  Goals Unmet:  Not Applicable  Comments: Pt able to follow exercise prescription today without complaint.  Will continue to monitor for progression.    Dr. Emily Filbert is Medical Director for [place] [facility].

## 2018-08-06 ENCOUNTER — Other Ambulatory Visit: Payer: Self-pay

## 2018-08-06 ENCOUNTER — Encounter: Payer: Medicare Other | Admitting: *Deleted

## 2018-08-06 DIAGNOSIS — Z951 Presence of aortocoronary bypass graft: Secondary | ICD-10-CM

## 2018-08-06 NOTE — Progress Notes (Signed)
Daily Session Note  Patient Details  Name: Jermaine Campbell MRN: [id number] Date of Birth: [birth year] Referring Provider:     Cardiac Rehab from 03/05/2018 in [facility] Cardiac and Pulmonary Rehab  Referring Provider  Dwaine Deter MD      Encounter Date: 08/06/2018  Check In: Session Check In - 08/06/18 1039      Check-In   Supervising physician immediately available to respond to emergencies  See telemetry face sheet for immediately available ER MD    Location  [facility]-Cardiac & Pulmonary Rehab    Staff Present  Justin Mend Jaci Carrel, BS, ACSM CEP, Exercise Physiologist;Susanne Bice, RN, BSN, CCRP    Virtual Visit  No    Medication changes reported      No    Fall or balance concerns reported     No    Warm-up and Cool-down  Performed on first and last piece of equipment    Resistance Training Performed  Yes    VAD Patient?  No    PAD/SET Patient?  No      Pain Assessment   Currently in Pain?  No/denies          Social History   Tobacco Use  Smoking Status Never Smoker  Smokeless Tobacco Never Used    Goals Met:  Independence with exercise equipment Exercise tolerated well No report of cardiac concerns or symptoms Strength training completed today  Goals Unmet:  Not Applicable  Comments: Pt able to follow exercise prescription today without complaint.  Will continue to monitor for progression.    Dr. Emily Filbert is Medical Director for [place] [facility].

## 2018-08-08 ENCOUNTER — Encounter: Payer: Medicare Other | Admitting: *Deleted

## 2018-08-08 ENCOUNTER — Encounter: Payer: Self-pay | Admitting: *Deleted

## 2018-08-08 ENCOUNTER — Other Ambulatory Visit: Payer: Self-pay

## 2018-08-08 VITALS — Ht 69.0 in | Wt 170.2 lb

## 2018-08-08 DIAGNOSIS — Z951 Presence of aortocoronary bypass graft: Secondary | ICD-10-CM | POA: Diagnosis not present

## 2018-08-08 NOTE — Patient Instructions (Addendum)
Discharge Patient Instructions  Patient Details  Name: Jermaine Campbell: [id number] Date of Birth: May 11, 1945 Referring Provider:  Marinda Elk, MD   Number of Visits: 12  Reason for Discharge:  Patient reached a stable level of exercise. Patient independent in their exercise. Patient has met program and personal goals.  Smoking History:  Social History   Tobacco Use  Smoking Status Never Smoker  Smokeless Tobacco Never Used    Diagnosis:  S/P CABG (coronary artery bypass graft)  Initial Exercise Prescription: Initial Exercise Prescription - 03/05/18 1400      Date of Initial Exercise RX and Referring Provider   Date  03/05/18    Referring Provider  Dwaine Deter MD      Treadmill   MPH  2.2    Grade  0.5    Minutes  15    METs  2.84      NuStep   Level  2    SPM  80    Minutes  15    METs  2.5      REL-XR   Level  1    Speed  50    Minutes  15    METs  2.5      Prescription Details   Frequency (times per week)  2    Duration  Progress to 30 minutes of continuous aerobic without signs/symptoms of physical distress      Intensity   THRR 40-80% of Max Heartrate  108-133    Ratings of Perceived Exertion  11-13    Perceived Dyspnea  0-4      Progression   Progression  Continue progressive overload as per policy without signs/symptoms or physical distress.      Resistance Training   Training Prescription  Yes    Weight  3 lbs    Reps  10-15       Discharge Exercise Prescription (Final Exercise Prescription Changes): Exercise Prescription Changes - 04/11/18 1100      Response to Exercise   Blood Pressure (Admit)  94/62    Blood Pressure (Exercise)  138/60    Blood Pressure (Exit)  126/70    Heart Rate (Admit)  89 bpm    Heart Rate (Exercise)  119 bpm    Heart Rate (Exit)  90 bpm    Rating of Perceived Exertion (Exercise)  13    Symptoms  none    Duration  Continue with 30 min of aerobic exercise without signs/symptoms of  physical distress.    Intensity  THRR unchanged      Progression   Progression  Continue to progress workloads to maintain intensity without signs/symptoms of physical distress.    Average METs  2.68      Resistance Training   Training Prescription  Yes    Weight  3 lbs    Reps  10-15      Interval Training   Interval Training  No      Treadmill   MPH  2.2    Grade  0.5    Minutes  15    METs  2.84      NuStep   Level  2    Minutes  15    METs  2.3      REL-XR   Level  1    Minutes  15    METs  2.9      Home Exercise Plan   Plans to continue exercise at  Home (comment)  walking and [employer]   Frequency  Add 3 additional days to program exercise sessions.    Initial Home Exercises Provided  03/27/18       Functional Capacity: 6 Minute Walk    Row Name 03/05/18 1422 08/08/18 1019       6 Minute Walk   Phase  Initial  Discharge    Distance  1245 feet  1744 feet    Distance % Change  -  28.6 %    Distance Feet Change  -  499 ft    Walk Time  6 minutes  6 minutes    # of Rest Breaks  0  0    MPH  2.36  3.3    METS  2.87  3.89    RPE  11  11    VO2 Peak  10.07  13.53    Symptoms  No  No    Resting HR  83 bpm  69 bpm    Resting BP  126/66  126/60    Resting Oxygen Saturation   98 %  -    Exercise Oxygen Saturation  during 6 min walk  94 %  -    Max Ex. HR  106 bpm  125 bpm    Max Ex. BP  146/82  136/70    2 Minute Post BP  128/64  -       Quality of Life: Quality of Life - 08/08/18 1136      Quality of Life Scores   Health/Function Post  23.75 %    Socioeconomic Post  25.93 %    Psych/Spiritual Post  27.36 %    Family Post  30 %    GLOBAL Post  25.92 %       Personal Goals: Goals established at orientation with interventions provided to work toward goal. Personal Goals and Risk Factors at Admission - 03/05/18 1323      Core Components/Risk Factors/Patient Goals on Admission    Weight Management  Weight Gain;Yes    Intervention  Weight  Management: Develop a combined nutrition and exercise program designed to reach desired caloric intake, while maintaining appropriate intake of nutrient and fiber, sodium and fats, and appropriate energy expenditure required for the weight goal.;Weight Management: Provide education and appropriate resources to help participant work on and attain dietary goals.    Admit Weight  171 lb (77.6 kg)    Goal Weight: Short Term  173 lb (78.5 kg)    Goal Weight: Long Term  176 lb (79.8 kg)    Expected Outcomes  Short Term: Continue to assess and modify interventions until short term weight is achieved;Long Term: Adherence to nutrition and physical activity/exercise program aimed toward attainment of established weight goal   Jermaine Campbell and his wife will talk with RD that has prescribed the tube feedings to see how to increase caloric intake.    Diabetes  Yes   The tube feedings that Jermaine Campbell has been using have increased his blood glucose levels.    Intervention  Provide education about signs/symptoms and action to take for hypo/hyperglycemia.;Provide education about proper nutrition, including hydration, and aerobic/resistive exercise prescription along with prescribed medications to achieve blood glucose in normal ranges: Fasting glucose 65-99 mg/dL    Expected Outcomes  Short Term: Participant verbalizes understanding of the signs/symptoms and immediate care of hyper/hypoglycemia, proper foot care and importance of medication, aerobic/resistive exercise and nutrition plan for blood glucose control.;Long Term: Attainment of HbA1C < 7%.  Lipids  Yes    Intervention  Provide education and support for participant on nutrition & aerobic/resistive exercise along with prescribed medications to achieve LDL '70mg'$ , HDL >'40mg'$ .    Expected Outcomes  Short Term: Participant states understanding of desired cholesterol values and is compliant with medications prescribed. Participant is following exercise prescription and nutrition  guidelines.;Long Term: Cholesterol controlled with medications as prescribed, with individualized exercise RX and with personalized nutrition plan. Value goals: LDL < '70mg'$ , HDL > 40 mg.        Personal Goals Discharge: Goals and Risk Factor Review - 06/11/18 1319      Core Components/Risk Factors/Patient Goals Review   Personal Goals Review  Weight Management/Obesity;Hypertension;Diabetes;Lipids    Review  Jermaine Campbell's weight has continued to be steady.  His sugars and pressures all continue to be good.  He has done well with radiation and will follow up in June.  He still wants to gain more strength and we talked about protein intake and will have Jermaine Campbell help as well.     Expected Outcomes  Short: Continue to maintain weight.  Long: Continue to monitor risk factors.        Exercise Goals and Review: Exercise Goals    Row Name 03/05/18 1428             Exercise Goals   Increase Physical Activity  Yes       Intervention  Provide advice, education, support and counseling about physical activity/exercise needs.;Develop an individualized exercise prescription for aerobic and resistive training based on initial evaluation findings, risk stratification, comorbidities and participant's personal goals.       Expected Outcomes  Short Term: Attend rehab on a regular basis to increase amount of physical activity.;Long Term: Exercising regularly at least 3-5 days a week.;Long Term: Add in home exercise to make exercise part of routine and to increase amount of physical activity.       Increase Strength and Stamina  Yes       Intervention  Provide advice, education, support and counseling about physical activity/exercise needs.;Develop an individualized exercise prescription for aerobic and resistive training based on initial evaluation findings, risk stratification, comorbidities and participant's personal goals.       Expected Outcomes  Short Term: Increase workloads from initial exercise prescription  for resistance, speed, and METs.;Short Term: Perform resistance training exercises routinely during rehab and add in resistance training at home;Long Term: Improve cardiorespiratory fitness, muscular endurance and strength as measured by increased METs and functional capacity (6MWT)       Able to understand and use rate of perceived exertion (RPE) scale  Yes       Intervention  Provide education and explanation on how to use RPE scale       Expected Outcomes  Short Term: Able to use RPE daily in rehab to express subjective intensity level;Long Term:  Able to use RPE to guide intensity level when exercising independently       Knowledge and understanding of Target Heart Rate Range (THRR)  Yes       Intervention  Provide education and explanation of THRR including how the numbers were predicted and where they are located for reference       Expected Outcomes  Short Term: Able to state/look up THRR;Short Term: Able to use daily as guideline for intensity in rehab;Long Term: Able to use THRR to govern intensity when exercising independently       Able to check pulse independently  Yes  Intervention  Provide education and demonstration on how to check pulse in carotid and radial arteries.;Review the importance of being able to check your own pulse for safety during independent exercise       Expected Outcomes  Long Term: Able to check pulse independently and accurately;Short Term: Able to explain why pulse checking is important during independent exercise       Understanding of Exercise Prescription  Yes       Intervention  Provide education, explanation, and written materials on patient's individual exercise prescription       Expected Outcomes  Short Term: Able to explain program exercise prescription;Long Term: Able to explain home exercise prescription to exercise independently          Exercise Goals Re-Evaluation: Exercise Goals Re-Evaluation    Row Name 03/20/18 7253 03/27/18 1617 04/03/18  1016 04/11/18 1145 04/30/18 1111     Exercise Goal Re-Evaluation   Exercise Goals Review  Increase Physical Activity;Increase Strength and Stamina;Knowledge and understanding of Target Heart Rate Range (THRR);Able to understand and use rate of perceived exertion (RPE) scale;Understanding of Exercise Prescription  Increase Physical Activity;Increase Strength and Stamina;Able to understand and use rate of perceived exertion (RPE) scale;Knowledge and understanding of Target Heart Rate Range (THRR);Able to check pulse independently;Understanding of Exercise Prescription  Increase Physical Activity;Increase Strength and Stamina;Understanding of Exercise Prescription  Increase Physical Activity;Increase Strength and Stamina;Understanding of Exercise Prescription  Increase Physical Activity;Increase Strength and Stamina;Understanding of Exercise Prescription   Comments  Reviewed RPE scale, THR and program prescription with pt today.  Pt voiced understanding and was given a copy of goals to take home.   Reviewed home exercise with pt today.  Pt plans to continue walking at home for exercise.  He also has access to the [facility] for exercise on bad weather days.  Reviewed THR, pulse, RPE, sign and symptoms, and when to call 911 or MD.  Also discussed weather considerations and indoor options.  Pt voiced understanding.  Jermaine Campbell has been doing well in rehab.  He has been getting his home exercise.  [facility] He is recovering his strength and stamina.   Jermaine Campbell continues to do well in rehab.  He will be ready to start to increase workloads upon return.  He is planning to walk and use videos at home.  We will continue to monitor his progress at home.   Jermaine Campbell has been walking at home.  He is up to 1.2 miles a day.  He would like to work up to 2 miles a day, but is still tired after walking.  I encouraged him to continue to work towards his goal slowly.  He mentioned that he was sore on Saturday after doing weights on  Friday.  He is trying bigger ranges of motions and has been doing weights five days a week.  I encouraged him to take a couple days off from weights and to try again with it.  He has enjoyed our videos and has added them to his routine. His favorite parts our antics and [place] just being ourselves.    Expected Outcomes  Short: Use RPE daily to regulate intensity. Long: Follow program prescription in THR.  Short: Start to add in walking at home on off days.  Long: Continue to improve strength and stamina.   Short: Continue to exercise on his off days.  Long: Continue to build his strength and stamina.   Short: Continue to exercise by walking.  Long: Continue to build strength and stamina.   Short: Take a break from weights then get back to them to help with soreness.  Long: Continue to walk daily.    [place] Name 05/21/18 1230 06/11/18 1314           Exercise Goal Re-Evaluation   Exercise Goals Review  Increase Physical Activity;Increase Strength and Stamina;Understanding of Exercise Prescription  Increase Physical Activity;Increase Strength and Stamina;Understanding of Exercise Prescription      Comments  Jermaine Campbell continues to walk at home.  He now up to 1.5 miles a day.  He still is not where he would like to be and that can get him discouraged.  He would like to progress faster.  He is using our videos for warm up, weights, and cool down stretches as well.  I encouraged him to use all of the different weight workouts to provide a good variety to his routine.   When I called Jermaine Campbell had been out walking.  He is still doing 1.5 miles a day.  Today it took him 28 mins. He said that when he gets back he is tired but not worn out. He has continued to watch our videos and try them out., then he deletes them.  He asked about increasing his strength again, as it is not where he wants to be.  He has been using the weigth exercises from the home exercise packet.  I encouraged him to reuse our vidoes that we send  to try get some  more varitey. I resent all of the links to the weight videos again.  We also talked about making sure that he is getting enough protein in his diet and have reached out to [facility] to help with this as well.       Expected Outcomes  Short: Vary up weight workouts.  Long: Continue to walk daily.   Short: Add more variety to weight workouts.  Long: Continue to walk to maintain stamina.          Nutrition & Weight - Outcomes: Pre Biometrics - 03/05/18 1431      Pre Biometrics   Height  '5\' 9"'$  (1.753 m)    Weight  171 lb 1.6 oz (77.6 kg)    Waist Circumference  37 inches    Hip Circumference  42 inches    Waist to Hip Ratio  0.88 %    BMI (Calculated)  25.26    Single Leg Stand  2.69 seconds      Post Biometrics - 08/08/18 1021       Post  Biometrics   Height  '5\' 9"'$  (1.753 m)    Weight  170 lb 3.2 oz (77.2 kg)    BMI (Calculated)  25.12    Single Leg Stand  4.72 seconds       Nutrition: Nutrition Therapy & Goals - 04/24/18 1325      Nutrition Therapy   Diet  Jermaine Campbell is on G Tube feedings. His wife will be consulting with the RD that has set up the tube feedings. On Osmolite 1.5 (6 cartons/day): 2130kcal, 101m Water, 89.4g pro; 2453mfree water flushes with meals (meals 4x/day) = 208671mater/day. Needs 1850-2150kcal/day needs (MSJ x 1.2-1.3, 80-90g pro.     Protein (specify units)  80-90g      Personal Nutrition Goals   Nutrition Goal  ST: miralax 1 scoop per day (originally 1 scoop every other): rememeber to take with liquid. LT: continue tube feeds Osmolite 1.5 (6  cartons/day) with 240 FW flushes per meal; 4 meals.     Comments  Pt tolerating tube feeds and maintaining weight; pt will often be constipated, sugested changing to miralax everyday. Pt controlling BG well will formula and metformin. Pt will eat small bites of calorie dense food, but not much. Pt has been drinking some orally. Per wife, RD at [facility] said his fluids are fine with increases PA from [facility]. Cannot find note  from [facility] RD or MD about feeding schedule and plan.       Intervention Plan   Intervention  Prescribe, educate and counsel regarding individualized specific dietary modifications aiming towards targeted core components such as weight, hypertension, lipid management, diabetes, heart failure and other comorbidities.    Expected Outcomes  Short Term Goal: Understand basic principles of dietary content, such as calories, fat, sodium, cholesterol and nutrients.;Short Term Goal: A plan has been developed with personal nutrition goals set during dietitian appointment.;Long Term Goal: Adherence to prescribed nutrition plan.       Nutrition Discharge: Nutrition Assessments - 08/08/18 1136      MEDFICTS Scores   Post Score  --   unable to fill out- tube feed      Education Questionnaire Score: Knowledge Questionnaire Score - 08/08/18 1136      Knowledge Questionnaire Score   Post Score  25/26       Goals reviewed with patient; copy given to patient.

## 2018-08-08 NOTE — Progress Notes (Signed)
Cardiac Individual Treatment Plan  Patient Details  Name: Jermaine Campbell MRN: [id number] Date of Birth: 20-Jul-1945 Referring Provider:     Cardiac Rehab from 03/05/2018 in [facility] Cardiac and Pulmonary Rehab  Referring Provider  Dwaine Deter MD      Initial Encounter Date:    Cardiac Rehab from 03/05/2018 in [facility] Cardiac and Pulmonary Rehab  Date  03/05/18      Visit Diagnosis: S/P CABG (coronary artery bypass graft)  Patient's Home Medications on Admission:  Current Outpatient Medications:  .  ALPRAZolam (XANAX) 0.5 MG tablet, Place 0.25 mg into feeding tube at bedtime as needed for sleep., Disp: , Rfl:  .  atorvastatin (LIPITOR) 80 MG tablet, Place 80 mg into feeding tube daily., Disp: , Rfl:  .  bicalutamide (CASODEX) 50 MG tablet, Take 50 mg by mouth daily., Disp: , Rfl: 2 .  docusate sodium (COLACE) 100 MG capsule, Take 1 capsule (100 mg total) by mouth 2 (two) times daily. (Patient not taking: Reported on 03/05/2018), Disp: 60 capsule, Rfl: 0 .  fluticasone (FLONASE) 50 MCG/ACT nasal spray, Place 1 spray into both nostrils as needed. , Disp: , Rfl:  .  HYDROcodone-acetaminophen (NORCO/VICODIN) 5-325 MG tablet, Take 1-2 tablets by mouth every 6 (six) hours as needed for moderate pain. (Patient not taking: Reported on 03/05/2018), Disp: 10 tablet, Rfl: 0 .  ibuprofen (ADVIL,MOTRIN) 200 MG tablet, Take 200 mg by mouth every 6 (six) hours as needed., Disp: , Rfl:  .  isosorbide dinitrate (ISORDIL) 10 MG tablet, Place 0.5 mg into feeding tube 2 (two) times daily., Disp: , Rfl:  .  leuprolide (LUPRON) 1 MG/0.2ML injection, Inject into the skin every 3 (three) months. , Disp: , Rfl:  .  levothyroxine (SYNTHROID, LEVOTHROID) 100 MCG tablet, Take 100 mcg by mouth daily before breakfast. , Disp: , Rfl:  .  metFORMIN (GLUCOPHAGE) 500 MG tablet, Place 500 mg into feeding tube 2 (two) times daily with a meal., Disp: , Rfl:  .  metoprolol tartrate (LOPRESSOR) 25 MG tablet, Take 12.5 mg by  mouth 2 (two) times daily. , Disp: , Rfl:  .  ondansetron (ZOFRAN) 4 MG tablet, Place 4 mg into feeding tube every 8 (eight) hours as needed for nausea or vomiting., Disp: , Rfl:  .  polyethylene glycol (MIRALAX / GLYCOLAX) packet, Take 17 g by mouth daily., Disp: , Rfl:  .  rivaroxaban (XARELTO) 10 MG TABS tablet, Take 10 mg by mouth every evening. , Disp: , Rfl:  .  simvastatin (ZOCOR) 40 MG tablet, Take 40 mg by mouth daily. , Disp: , Rfl:  .  tamsulosin (FLOMAX) 0.4 MG CAPS capsule, Take 1 capsule (0.4 mg total) by mouth daily. (Patient not taking: Reported on 08/21/2017), Disp: 30 capsule, Rfl: 0 .  vitamin B-12 (CYANOCOBALAMIN) 1000 MCG tablet, Take 1,000 mcg by mouth daily., Disp: , Rfl:   Past Medical History: Past Medical History:  Diagnosis Date  . Anemia   . Bradycardia   . 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 Cancer of prostate ([place]) 2019  . Coronary artery disease    atherosclerosis of native coronary artery of native heart without angina pectoris  . Deaf    Left ear  . Dysphagia    history of d/t throat cancer  . Dysrhythmia    paroxysmal atrial fibrillation, bradycardia  . H/O syncope    several yrs ago  . History of  BPH   . Hypercholesteremia   . Hypertension   . Hypothyroidism    s/p radiation tx - throat CA  . Myocardial infarction ([place])    1996  . Neck stiffness    s/p radiation tx - Throat CA  . Seizures ([place])    last one 2 yrs ago. not medicated. d/t injury to frontal lobe  . Vertigo    no episodes 4-5 yrs/dizziness  . Wears hearing aid    Right ear/ deaf in left ear    Tobacco Use: Social History   Tobacco Use  Smoking Status Never Smoker  Smokeless Tobacco Never Used    Labs: Recent Review Flowsheet Data    There is no flowsheet data to display.       Exercise Target Goals: Exercise Program Goal: Individual exercise prescription set using results from initial 6 min walk test  and THRR while considering  patient's activity barriers and safety.   Exercise Prescription Goal: Initial exercise prescription builds to 30-45 minutes a day of aerobic activity, 2-3 days per week.  Home exercise guidelines will be given to patient during program as part of exercise prescription that the participant will acknowledge.  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 HR  106 bpm  125 bpm    Max Ex. BP  146/82  136/70    2 Minute Post BP  128/64  -       Oxygen Initial Assessment:   Oxygen Re-Evaluation:   Oxygen Discharge (Final Oxygen Re-Evaluation):   Initial Exercise Prescription: Initial Exercise Prescription - 03/05/18 1400      Date of Initial Exercise RX and Referring Provider   Date  03/05/18    Referring Provider  Dwaine Deter MD      Treadmill   MPH  2.2    Grade  0.5    Minutes  15    METs  2.84      NuStep   Level  2    SPM  80    Minutes  15    METs  2.5      REL-XR   Level  1  Speed  50    Minutes  15    METs  2.5      Prescription Details   Frequency  (times per week)  2    Duration  Progress to 30 minutes of continuous aerobic without signs/symptoms of physical distress      Intensity   THRR 40-80% of Max Heartrate  108-133    Ratings of Perceived Exertion  11-13    Perceived Dyspnea  0-4      Progression   Progression  Continue progressive overload as per policy without signs/symptoms or physical distress.      Resistance Training   Training Prescription  Yes    Weight  3 lbs    Reps  10-15       Perform Capillary Blood Glucose checks as needed.  Exercise Prescription Changes: Exercise Prescription Changes    Row Name 03/05/18 1300 03/27/18 1600 03/28/18 1400 04/11/18 1100       Response to Exercise   Blood Pressure (Admit)  126/66  -  94/70  94/62    Blood Pressure (Exercise)  146/82  -  146/72  138/60    Blood Pressure (Exit)  128/64  -  106/60  126/70    Heart Rate (Admit)  93 bpm  -  95 bpm  89 bpm    Heart Rate (Exercise)  106 bpm  -  106 bpm  119 bpm    Heart Rate (Exit)  87 bpm  -  94 bpm  90 bpm    Oxygen Saturation (Admit)  98 %  -  -  -    Oxygen Saturation (Exercise)  94 %  -  -  -    Rating of Perceived Exertion (Exercise)  11  -  11  13    Symptoms  none  -  none  none    Comments  walk test results  -  -  -    Duration  -  -  Continue with 30 min of aerobic exercise without signs/symptoms of physical distress.  Continue with 30 min of aerobic exercise without signs/symptoms of physical distress.    Intensity  -  -  THRR unchanged  THRR unchanged      Progression   Progression  -  -  Continue to progress workloads to maintain intensity without signs/symptoms of physical distress.  Continue to progress workloads to maintain intensity without signs/symptoms of physical distress.    Average METs  -  -  2.64  2.68      Resistance Training   Training Prescription  -  -  Yes  Yes    Weight  -  -  3 lbs  3 lbs    Reps  -  -  10-15  10-15      Interval Training   Interval Training  -  -  No  No       Treadmill   MPH  -  -  2.2  2.2    Grade  -  -  0.5  0.5    Minutes  -  -  15  15    METs  -  -  2.84  2.84      NuStep   Level  -  -  2  2    Minutes  -  -  15  15    METs  -  -  2  2.3      REL-XR   Level  -  -  1  1    Minutes  -  -  15  15    METs  -  -  3.1  2.9      Home Exercise Plan   Plans to continue exercise at  -  Home (comment) walking and [employer]  Home (comment) walking and [employer]  Home (comment) walking and [employer]    Frequency  -  Add 3 additional days to program exercise sessions.  Add 3 additional days to program exercise sessions.  Add 3 additional days to program exercise sessions.    Initial Home Exercises Provided  -  03/27/18  03/27/18  03/27/18       Exercise Comments: Exercise Comments    Row Name 03/20/18 [id number]           Exercise Comments  First full day of exercise!  Patient was oriented to gym and equipment including functions, settings, policies, and procedures.  Patient's individual exercise prescription and treatment plan were reviewed.  All starting workloads were established based on the results of the 6 minute walk test done at initial orientation visit.  The plan for exercise progression was also introduced and progression will be customized based on patient's performance and goals.          Exercise Goals and Review: Exercise Goals    Row Name 03/05/18 1428             Exercise Goals   Increase Physical Activity  Yes       Intervention  Provide advice, education, support and counseling about physical activity/exercise needs.;Develop an individualized exercise prescription for aerobic and resistive training based on initial evaluation findings, risk stratification, comorbidities and participant's personal goals.       Expected Outcomes  Short Term: Attend rehab on a regular basis to increase amount of physical activity.;Long Term: Exercising regularly at least 3-5 days a week.;Long Term: Add in home exercise to make exercise part of routine and  to increase amount of physical activity.       Increase Strength and Stamina  Yes       Intervention  Provide advice, education, support and counseling about physical activity/exercise needs.;Develop an individualized exercise prescription for aerobic and resistive training based on initial evaluation findings, risk stratification, comorbidities and participant's personal goals.       Expected Outcomes  Short Term: Increase workloads from initial exercise prescription for resistance, speed, and METs.;Short Term: Perform resistance training exercises routinely during rehab and add in resistance training at home;Long Term: Improve cardiorespiratory fitness, muscular endurance and strength as measured by increased METs and functional capacity (6MWT)       Able to understand and use rate of perceived exertion (RPE) scale  Yes       Intervention  Provide education and explanation on how to use RPE scale       Expected Outcomes  Short Term: Able to use RPE daily in rehab to express subjective intensity level;Long Term:  Able to use RPE to guide intensity level when exercising independently       Knowledge and understanding of Target Heart Rate Range (THRR)  Yes       Intervention  Provide education and explanation of THRR including how the numbers were predicted and where they are located for reference       Expected Outcomes  Short Term: Able to state/look up THRR;Short Term: Able to use daily as guideline for intensity in rehab;Long Term: Able to use THRR to govern intensity  when exercising independently       Able to check pulse independently  Yes       Intervention  Provide education and demonstration on how to check pulse in carotid and radial arteries.;Review the importance of being able to check your own pulse for safety during independent exercise       Expected Outcomes  Long Term: Able to check pulse independently and accurately;Short Term: Able to explain why pulse checking is important during  independent exercise       Understanding of Exercise Prescription  Yes       Intervention  Provide education, explanation, and written materials on patient's individual exercise prescription       Expected Outcomes  Short Term: Able to explain program exercise prescription;Long Term: Able to explain home exercise prescription to exercise independently          Exercise Goals Re-Evaluation : Exercise Goals Re-Evaluation    Row Name 03/20/18 1093 03/27/18 1617 04/03/18 1016 04/11/18 1145 04/30/18 1111     Exercise Goal Re-Evaluation   Exercise Goals Review  Increase Physical Activity;Increase Strength and Stamina;Knowledge and understanding of Target Heart Rate Range (THRR);Able to understand and use rate of perceived exertion (RPE) scale;Understanding of Exercise Prescription  Increase Physical Activity;Increase Strength and Stamina;Able to understand and use rate of perceived exertion (RPE) scale;Knowledge and understanding of Target Heart Rate Range (THRR);Able to check pulse independently;Understanding of Exercise Prescription  Increase Physical Activity;Increase Strength and Stamina;Understanding of Exercise Prescription  Increase Physical Activity;Increase Strength and Stamina;Understanding of Exercise Prescription  Increase Physical Activity;Increase Strength and Stamina;Understanding of Exercise Prescription   Comments  Reviewed RPE scale, THR and program prescription with pt today.  Pt voiced understanding and was given a copy of goals to take home.   Reviewed home exercise with pt today.  Pt plans to continue walking at home for exercise.  He also has access to the [facility] for exercise on bad weather days.  Reviewed THR, pulse, RPE, sign and symptoms, and when to call 911 or MD.  Also discussed weather considerations and indoor options.  Pt voiced understanding.  Waunita Schooner has been doing well in rehab.  He has been getting his home exercise[facility] He is recovering his strength  and stamina.   Waunita Schooner continues to do well in rehab.  He will be ready to start to increase workloads upon return.  He is planning to walk and use videos at home.  We will continue to monitor his progress at home.   Waunita Schooner has been walking at home.  He is up to 1.2 miles a day.  He would like to work up to 2 miles a day, but is still tired after walking.  I encouraged him to continue to work towards his goal slowly.  He mentioned that he was sore on Saturday after doing weights on Friday.  He is trying bigger ranges of motions and has been doing weights five days a week.  I encouraged him to take a couple days off from weights and to try again with it.  He has enjoyed our videos and has added them to his routine. His favorite parts our antics and [place] just being ourselves.    Expected Outcomes  Short: Use RPE daily to regulate intensity. Long: Follow program prescription in THR.  Short: Start to add in walking at home on off days.  Long: Continue to improve strength and stamina.   Short: Continue to  exercise on his off days.  Long: Continue to build his strength and stamina.   Short: Continue to exercise by walking.  Long: Continue to build strength and stamina.   Short: Take a break from weights then get back to them to help with soreness.  Long: Continue to walk daily.    [place] Name 05/21/18 1230 06/11/18 1314           Exercise Goal Re-Evaluation   Exercise Goals Review  Increase Physical Activity;Increase Strength and Stamina;Understanding of Exercise Prescription  Increase Physical Activity;Increase Strength and Stamina;Understanding of Exercise Prescription      Comments  Waunita Schooner continues to walk at home.  He now up to 1.5 miles a day.  He still is not where he would like to be and that can get him discouraged.  He would like to progress faster.  He is using our videos for warm up, weights, and cool down stretches as well.  I encouraged him to use all of the different weight workouts to provide a good variety  to his routine.   When I called Waunita Schooner had been out walking.  He is still doing 1.5 miles a day.  Today it took him 28 mins. He said that when he gets back he is tired but not worn out. He has continued to watch our videos and try them out., then he deletes them.  He asked about increasing his strength again, as it is not where he wants to be.  He has been using the weigth exercises from the home exercise packet.  I encouraged him to reuse our vidoes that we send  to try get some more varitey. I resent all of the links to the weight videos again.  We also talked about making sure that he is getting enough protein in his diet and have reached out to [facility] to help with this as well.       Expected Outcomes  Short: Vary up weight workouts.  Long: Continue to walk daily.   Short: Add more variety to weight workouts.  Long: Continue to walk to maintain stamina.          Discharge Exercise Prescription (Final Exercise Prescription Changes): Exercise Prescription Changes - 04/11/18 1100      Response to Exercise   Blood Pressure (Admit)  94/62    Blood Pressure (Exercise)  138/60    Blood Pressure (Exit)  126/70    Heart Rate (Admit)  89 bpm    Heart Rate (Exercise)  119 bpm    Heart Rate (Exit)  90 bpm    Rating of Perceived Exertion (Exercise)  13    Symptoms  none    Duration  Continue with 30 min of aerobic exercise without signs/symptoms of physical distress.    Intensity  THRR unchanged      Progression   Progression  Continue to progress workloads to maintain intensity without signs/symptoms of physical distress.    Average METs  2.68      Resistance Training   Training Prescription  Yes    Weight  3 lbs    Reps  10-15      Interval Training   Interval Training  No      Treadmill   MPH  2.2    Grade  0.5    Minutes  15    METs  2.84      NuStep   Level  2    Minutes  15    METs  2.3      REL-XR   Level  1    Minutes  15    METs  2.9      Home Exercise Plan   Plans to  continue exercise at  Home (comment)   walking and [employer]   Frequency  Add 3 additional days to program exercise sessions.    Initial Home Exercises Provided  03/27/18       Nutrition:  Target Goals: Understanding of nutrition guidelines, daily intake of sodium <1555m, cholesterol <2058m calories 30% from fat and 7% or less from saturated fats, daily to have 5 or more servings of fruits and vegetables.  Biometrics: Pre Biometrics - 03/05/18 1431      Pre Biometrics   Height  5' 9" (1.753 m)    Weight  171 lb 1.6 oz (77.6 kg)    Waist Circumference  37 inches    Hip Circumference  42 inches    Waist to Hip Ratio  0.88 %    BMI (Calculated)  25.26    Single Leg Stand  2.69 seconds      Post Biometrics - 08/08/18 1021       Post  Biometrics   Height  5' 9" (1.753 m)    Weight  170 lb 3.2 oz (77.2 kg)    BMI (Calculated)  25.12    Single Leg Stand  4.72 seconds       Nutrition Therapy Plan and Nutrition Goals: Nutrition Therapy & Goals - 04/24/18 1325      Nutrition Therapy   Diet  DaWaunita Schooners on G Tube feedings. His wife will be consulting with the RD that has set up the tube feedings. On Osmolite 1.5 (6 cartons/day): 2130kcal, 108646mater, 89.4g pro; 240m11mee water flushes with meals (meals 4x/day) = 2086ml55mer/day. Needs 1850-2150kcal/day needs (MSJ x 1.2-1.3, 80-90g pro.     Protein (specify units)  80-90g      Personal Nutrition Goals   Nutrition Goal  ST: miralax 1 scoop per day (originally 1 scoop every other): rememeber to take with liquid. LT: continue tube feeds Osmolite 1.5 (6 cartons/day) with 240 FW flushes per meal; 4 meals.     Comments  Pt tolerating tube feeds and maintaining weight; pt will often be constipated, sugested changing to miralax everyday. Pt controlling BG well will formula and metformin. Pt will eat small bites of calorie dense food, but not much. Pt has been drinking some orally. [facility] his fluids are fine with increases PA from  [facility]. [facility] about feeding schedule and plan.       Intervention Plan   Intervention  Prescribe, educate and counsel regarding individualized specific dietary modifications aiming towards targeted core components such as weight, hypertension, lipid management, diabetes, heart failure and other comorbidities.    Expected Outcomes  Short Term Goal: Understand basic principles of dietary content, such as calories, fat, sodium, cholesterol and nutrients.;Short Term Goal: A plan has been developed with personal nutrition goals set during dietitian appointment.;Long Term Goal: Adherence to prescribed nutrition plan.       Nutrition Assessments: Nutrition Assessments - 08/08/18 1136      MEDFICTS Scores   Post Score  --   unable to fill out- tube feed      Nutrition Goals Re-Evaluation: Nutrition Goals Re-Evaluation    [place] 04/03/18 1019 06/19/18 1114 08/02/18 1129 08/06/18 1041  Goals   Current Weight  -  178 lb (80.7 kg)  -  170 lb 12.8 oz (77.5 kg)    Nutrition Goal  -  ST: continue to tolerate TF LT: continue tube feeds Osmolite 1.5 (6 cartons/day) with 240 FW flushes per meal; 4 meals.   ST: continue to tolerate TF LT: continue tube feeds Osmolite 1.5 (6 cartons/day) with 240 FW flushes per meal; 4 meals.  ST: continue to tolerate TF LT: continue tube feeds Osmolite 1.5 (6 cartons/day) with 240 FW flushes per meal; 4 meals. Gain weight (pt request to be about 180lbs) and muscle    Comment  Waunita Schooner is currently on a feeding tube since surgery.  He uses a nutritional paste.  He does not feel hungry or lack in appetite.   His protein needs are 85-93g (cancer, not in treatment), if treatment begins or inadequate protein indicators present themselves needs would increase to 93-108g; at that time he would need a protein modulator such as promod or prostat in addition to his tube feeds to be taken orally or through TF. Discussed with wife what that would look  like. Pt reports that he takes miralax when he is backed up 2-3 days and when he does use the bathroom it is easy to get out. Wife reported pt had miralax every other day, if he has any problems with constipation this could be increased to once per day (reminded to add fluid as well). Wfe reports pt gained some weight  and gave last months weight of 178lbs (has not weighed since). Pt continues to eat tbsps of food (not concerned about type, if he begins to eat more supplemental food, will discuss heart healthy choices). Pt with prostate cancer s/p radiation treatments (the last couple recently) --> to see doctor next month to see if in remission.  Pt wife reports that he was alwasys a napper and he has been doing well.   BM about every 2 days. Pt reports tolerating feeds well  pt reports having milkshakes  (couple of spoonfuls of ice cream, milk, ice) - this could help him increase calories for weight gain if pt continues to be able to tolerate small oral feedings. Pt was 165lbs now is weight stable between 170-172lbs (pt reports weight reported last month of 178lbs was incorrect). Pt wants me to speak to wife regarding protein, wife did not answer call (LM). Pt reports outside RD not following him, currently investigating; RD concerned no Dietitian managing or following tube feeds. Pt and pt wife report aside from this cardiac rehab RD, no Dietitian has been following them. telephone encounter 07/05/2018 internal medicine in [place] clinic refers to nutritionist and having nutrition to advise, unsure who they are referring to; reached out 08/06/2018 asked receptionist - she said she will inquire and have them call back - no response yet as of today 7/14. Reached out to [facility] Dr. Theodoro Kalata office regarding concerns spoke to [facility], she reports that she will reach out to their Dietitian and get the patient managed. Wife called 7/14 after phone call with [facility] and reiterated that a dietitian should be following them -  wife expressed concern and reiterates that aside from me no dietitian has been advising them- I told her I reached out to [facility] (Dr. Theodoro Kalata office) and [place] [facility] and that [facility] would reach out to them. As calculated before pt meeting needs, but could add a protein modulator to increase protein in order to help gain muscle; cannot change  tube feed regimen as I am not Dietitian managing TF.    Expected Outcome  Short: Continue to on feeding tube.  Long: Continue with healthy diet.   Continue tolerating TF  ST: continue to tolerate TF LT: continue tube feeds Osmolite 1.5 (6 cartons/day) with 240 FW flushes per meal; 4 meals.  ST: continue to tolerate TF LT: gain weight and muscle       Nutrition Goals Discharge (Final Nutrition Goals Re-Evaluation): Nutrition Goals Re-Evaluation - 08/06/18 1041      Goals   Current Weight  170 lb 12.8 oz (77.5 kg)    Nutrition Goal  ST: continue to tolerate TF LT: continue tube feeds Osmolite 1.5 (6 cartons/day) with 240 FW flushes per meal; 4 meals. Gain weight (pt request to be about 180lbs) and muscle    Comment  pt reports having milkshakes  (couple of spoonfuls of ice cream, milk, ice) - this could help him increase calories for weight gain if pt continues to be able to tolerate small oral feedings. Pt was 165lbs now is weight stable between 170-172lbs (pt reports weight reported last month of 178lbs was incorrect). Pt wants me to speak to wife regarding protein, wife did not answer call (LM). Pt reports outside RD not following him, currently investigating; RD concerned no Dietitian managing or following tube feeds. Pt and pt wife report aside from this cardiac rehab RD, no Dietitian has been following them. telephone encounter 07/05/2018 internal medicine in [place] [facility] refers to nutritionist and having nutrition to advise, unsure who they are referring to; reached out 08/06/2018 asked receptionist - she said she will inquire and have them call back - no  response yet as of today 7/14. Reached out to [facility] Dr. Theodoro Kalata office regarding concerns spoke to [facility], she reports that she will reach out to their Dietitian and get the patient managed. Wife called 7/14 after phone call with [facility] and reiterated that a dietitian should be following them - wife expressed concern and reiterates that aside from me no dietitian has been advising them- I told her I reached out to [facility] (Dr. Theodoro Kalata office) and [place] [facility] and that [facility] would reach out to them. As calculated before pt meeting needs, but could add a protein modulator to increase protein in order to help gain muscle; cannot change tube feed regimen as I am not Dietitian managing TF.    Expected Outcome  ST: continue to tolerate TF LT: gain weight and muscle       Psychosocial: Target Goals: Acknowledge presence or absence of significant depression and/or stress, maximize coping skills, provide positive support system. Participant is able to verbalize types and ability to use techniques and skills needed for reducing stress and depression.   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 He has multiple medical issues to work through before then. He scored a 9 on the PHQ9 and states that he is not able to get to sleep easily unless he uses the Xanax RX.       Family Dynamics   Good Support System?  Yes   Wife(Caregiver) and  his pastor  Barriers   Psychosocial barriers to participate in program  The patient should benefit from training in stress management and relaxation.;There are no identifiable barriers or psychosocial needs.      Screening Interventions   Interventions  Encouraged  to exercise;To provide support and resources with identified psychosocial needs;Provide feedback about the scores to participant    Expected Outcomes  Short Term goal: Utilizing psychosocial counselor, staff and physician to assist with identification of specific Stressors or current issues interfering with healing process. Setting desired goal for each stressor or current issue identified.;Long Term Goal: Stressors or current issues are controlled or eliminated.;Short Term goal: Identification and review with participant of any Quality of Life or Depression concerns found by scoring the questionnaire.;Long Term goal: The participant improves quality of Life and PHQ9 Scores as seen by post scores and/or verbalization of changes       Quality of Life Scores:  Quality of Life - 08/08/18 1136      Quality of Life Scores   Health/Function Post  23.75 %    Socioeconomic Post  25.93 %    Psych/Spiritual Post  27.36 %    Family Post  30 %    GLOBAL Post  25.92 %      Scores of 19 and below usually indicate a poorer quality of life in these areas.  A difference of  2-3 points is a clinically meaningful difference.  A difference of 2-3 points in the total score of the Quality of Life Index has been associated with significant improvement in overall quality of life, self-image, physical symptoms, and general health in studies assessing change in quality of life.  PHQ-9: Recent Review Flowsheet Data    Depression screen [facility] 2/9 08/08/2018 04/03/2018 03/05/2018   Decreased Interest 2 0 3   Down, Depressed, Hopeless 0 0 1   PHQ - 2 Score 2 0 4   Altered sleeping 0 0 2   Tired, decreased energy _0 Change in appetite 0 0 0   Feeling bad or failure about yourself  0 0 0   Trouble concentrating 0 1 0   Moving slowly or fidgety/restless 0 1 0   Suicidal thoughts 0 0 0   PHQ-9 Score _1 Difficult doing work/chores Somewhat difficult Somewhat difficult Somewhat difficult     Interpretation of  Total Score  Total Score Depression Severity:  1-4 = Minimal depression, 5-9 = Mild depression, 10-14 = Moderate depression, 15-19 = Moderately severe depression, 20-27 = Severe depression   Psychosocial Evaluation and Intervention: Psychosocial Evaluation - 03/22/18 1032      Psychosocial Evaluation & Interventions   Interventions  Stress management education;Encouraged to exercise with the program and follow exercise prescription    Comments  Counselor met with client who self-reported on 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  He observed differences in recovery ability from first to second procedure.  His primary support is his wife Shauna Hugh; their two daughters live in [place].  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  He spoke about physical weakness since surgery.  He reports that his sleeping is better then it was but believes he averages 5 to 6 hours a night.  Appetite is an issue which is long-standing, has a stomach tube which has been in place for years.  Primary stressors include impact of surgery on physical abilities and difficulty communicating due to  hearing issues. Counselor encouraged patience in recovery process.  Staff will follow.    Expected Outcomes  Short term goal: Focus on physical issues of building/increasing strength, stamina, and energy.  Long term goal: to be back on the golf course by May 2020    Continue Psychosocial Services   Follow up required by staff       Psychosocial Re-Evaluation: Psychosocial Re-Evaluation    [place] Name 04/03/18 1015 04/30/18 1103 05/21/18 1236 06/11/18 1321       Psychosocial Re-Evaluation   Current issues with  Current Stress Concerns;Current Depression;Current Sleep Concerns  Current Stress Concerns;Current Depression;Current Sleep Concerns  Current Stress Concerns;Current Depression;Current Sleep Concerns  Current Stress Concerns;Current Depression;Current Sleep Concerns    Comments   Reviewed patient health questionnaire (PHQ-9) with patient for follow up. Previously, patients score indicated signs/symptoms of depression.  Reviewed to see if patient is improving symptom wise while in program.  Score improved and patient states that it is because they have been getting stronger and back to normal.  He continues to worry about his arm and his health recovery as he would rather be able to just get out and go.  He is sleeping better but still waking but now able to get back to sleep.   Waunita Schooner has been doing well at home.  He is eager to get back to his routine and finish rehab.  He does need to schedule another procedure for his cancer treatments but they were waiting until after rehab was over.  They will reach out to the oncologist to find out how they wish to procede since we are currently closed.  Last week, he reported to the ED after seeing that his tube was turning black.  He saw the surgeon at [facility] and cleaned out some debris thought to be from some of his pulls that were crushed.  Overall, he is doing well and feeling pretty good.  He would like to speed up his recovery as it feels like it is moving at a snails pace due to his lack of testosterone which brings down his energy levels.   He is trying to stay positive and appreciates our calls, emails, and videos.   Waunita Schooner continues to do well at home.  He is still not where he would like to be and would like to recover faster.  He has seen his oncologist and has a follow up this afternoon with plans to set [place] radiation treatments again. He will keep up posted on how these are going.  Overall, he is good but not happy with his stamina.   He would be happier if he didn't have to take rest breaks.  I encouraged to just continue to do what he is doing especially during treatments.   Waunita Schooner has been doing well. His wife is making sure that he stays home and safe.  He has completed his radiation treatments.  He had three of them and will follow up in  June.  He is still not satified with his lack of strength and we talked about adding in more protein.  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  Overall, they are both doing well.     Expected Outcomes  Short: Continue to attend rehab regularly for regular exercise and social engagement. Long: Continue to improve symptoms and manage a positive mental state.  Short: Continue  to exercise and talk to oncologist.  Long: Continue to stay positive and make progress in recovery.   Short: Continue to exercise.  Long: Continue to stay positive and work towards where he wants to be  Short: Continue to exercise. Long: Continue to practice positive self care.     Interventions  Stress management education;Encouraged to attend Cardiac Rehabilitation for the exercise  Stress management education;Encouraged to attend Cardiac Rehabilitation for the exercise  -  Stress management education;Encouraged to attend Cardiac Rehabilitation for the exercise    Continue Psychosocial Services   Follow up required by staff  Follow up required by staff  -  Follow up required by staff       Psychosocial Discharge (Final Psychosocial Re-Evaluation): Psychosocial Re-Evaluation - 06/11/18 1321      Psychosocial Re-Evaluation   Current issues with  Current Stress Concerns;Current Depression;Current Sleep Concerns    Comments  Waunita Schooner has been doing well. His wife is making sure that he stays home and safe.  He has completed his radiation treatments.  He had three of them and will follow up in June.  He is still not satified with his lack of strength and we talked about adding in more protein.  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  Overall, they are both doing well.     Expected Outcomes  Short: Continue to exercise. Long: Continue to  practice positive self care.     Interventions  Stress management education;Encouraged to attend Cardiac Rehabilitation for the exercise    Continue Psychosocial Services   Follow up required by staff       Vocational Rehabilitation: Provide vocational rehab assistance to qualifying candidates.   Vocational Rehab Evaluation & Intervention: Vocational Rehab - 03/05/18 1323      Initial Vocational Rehab Evaluation & Intervention   Assessment shows need for Vocational Rehabilitation  No       Education: Education Goals: Education classes will be provided on a variety of topics geared toward better understanding of heart health and risk factor modification. Participant will state understanding/return demonstration of topics presented as noted by education test scores.  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   Cardiac Rehab from 04/05/2018 in [facility] Cardiac and Pulmonary Rehab  Date  03/20/18  Educator  SB  Instruction Review Code  1- Verbalizes Understanding      General Nutrition Guidelines/Fats and Fiber: -Group instruction provided by verbal, written material, models and posters to present the general guidelines for heart healthy nutrition. Gives an explanation and review of dietary fats and fiber.   Cardiac Rehab from 04/05/2018 in [facility] Cardiac and Pulmonary Rehab  Date  04/03/18  Educator  [facility]  Instruction Review Code  1- Verbalizes Understanding      Controlling Sodium/Reading Food Labels: -Group verbal and written material supporting the discussion of sodium use in heart healthy  nutrition. Review and explanation with models, verbal and written materials for utilization of the food label.   Cardiac  Rehab from 04/05/2018 in [facility] Cardiac and Pulmonary Rehab  Date  04/05/18  Educator  [facility]  Instruction Review Code  1- Verbalizes Understanding      Exercise Physiology & General Exercise Guidelines: - Group verbal and written instruction with models to review the exercise physiology of the cardiovascular system and associated critical values. Provides general exercise guidelines with specific guidelines to those with heart or lung disease.    Aerobic Exercise & Resistance Training: - Gives group verbal and written instruction on the various components of exercise. Focuses on aerobic and resistive training programs and the benefits of this training and how to safely progress through these programs..   Flexibility, Balance, Mind/Body Relaxation: Provides group verbal/written instruction on the benefits of flexibility and balance training, including mind/body exercise modes such as yoga, pilates and tai chi.  Demonstration and skill practice provided.   Stress and Anxiety: - Provides group verbal and written instruction about the health risks of elevated stress and causes of high stress.  Discuss the correlation between heart/lung disease and anxiety and treatment options. Review healthy ways to manage with stress and anxiety.   Depression: - Provides group verbal and written instruction on the correlation between heart/lung disease and depressed mood, treatment options, and the stigmas associated with seeking treatment.   Cardiac Rehab from 04/05/2018 in [facility] Cardiac and Pulmonary Rehab  Date  03/27/18  Educator  [facility]  Instruction Review Code  1- Verbalizes Understanding      Anatomy & Physiology of the Heart: - Group verbal and written instruction and models provide basic cardiac anatomy and physiology, with the coronary electrical and arterial systems. Review of  Valvular disease and Heart Failure   Cardiac Procedures: - Group verbal and written instruction to review commonly prescribed medications for heart disease. Reviews the medication, class of the drug, and side effects. Includes the steps to properly store meds and maintain the prescription regimen. (beta blockers and nitrates)   Cardiac Medications I: - Group verbal and written instruction to review commonly prescribed medications for heart disease. Reviews the medication, class of the drug, and side effects. Includes the steps to properly store meds and maintain the prescription regimen.   Cardiac Medications II: -Group verbal and written instruction to review commonly prescribed medications for heart disease. Reviews the medication, class of the drug, and side effects. (all other drug classes)   Cardiac Rehab from 04/05/2018 in [facility] Cardiac and Pulmonary Rehab  Date  03/29/18  Educator  CE  Instruction Review Code  1- Verbalizes Understanding       Go Sex-Intimacy & Heart Disease, Get SMART - Goal Setting: - Group verbal and written instruction through game format to discuss heart disease and the return to sexual intimacy. Provides group verbal and written material to discuss and apply goal setting through the application of the S.M.A.R.T. Method.   Other Matters of the Heart: - Provides group verbal, written materials and models to describe Stable Angina and Peripheral Artery. Includes description of the disease process and treatment options available to the cardiac patient.   Exercise & Equipment Safety: - Individual verbal instruction and demonstration of equipment use and safety with use of the equipment.   Cardiac Rehab from 04/05/2018 in [facility] Cardiac and Pulmonary Rehab  Date  03/05/18  Educator  [facility]  Instruction Review Code  1- Verbalizes Understanding      Infection Prevention: - Provides verbal and written material to individual with discussion of infection control  including proper hand washing and  proper equipment cleaning during exercise session.   Cardiac Rehab from 04/05/2018 in [facility] Cardiac and Pulmonary Rehab  Date  03/05/18  Educator  [facility]  Instruction Review Code  1- Verbalizes Understanding      Falls Prevention: - Provides verbal and written material to individual with discussion of falls prevention and safety.   Cardiac Rehab from 04/05/2018 in [facility] Cardiac and Pulmonary Rehab  Date  03/05/18  Educator  [facility]  Instruction Review Code  1- Verbalizes Understanding      Diabetes: - Individual verbal and written instruction to review signs/symptoms of diabetes, desired ranges of glucose level fasting, after meals and with exercise. Acknowledge that pre and post exercise glucose checks will be done for 3 sessions at entry of program.   Cardiac Rehab from 04/05/2018 in [facility] Cardiac and Pulmonary Rehab  Date  03/05/18  Educator  SB  Instruction Review Code  1- Verbalizes Understanding      Know Your Numbers and Risk Factors: -Group verbal and written instruction about important numbers in your health.  Discussion of what are risk factors and how they play a role in the disease process.  Review of Cholesterol, Blood Pressure, Diabetes, and BMI and the role they play in your overall health.   Cardiac Rehab from 04/05/2018 in [facility] Cardiac and Pulmonary Rehab  Date  03/29/18  Educator  CE  Instruction Review Code  1- Verbalizes Understanding      Sleep Hygiene: -Provides group verbal and written instruction about how sleep can affect your health.  Define sleep hygiene, discuss sleep cycles and impact of sleep habits. Review good sleep hygiene tips.    Other: -Provides group and verbal instruction on various topics (see comments)   Knowledge Questionnaire Score: Knowledge Questionnaire Score - 08/08/18 1136      Knowledge Questionnaire Score   Post Score  25/26       Core Components/Risk Factors/Patient Goals at Admission: Personal  Goals and Risk Factors at Admission - 03/05/18 1323      Core Components/Risk Factors/Patient Goals on Admission    Weight Management  Weight Gain;Yes    Intervention  Weight Management: Develop a combined nutrition and exercise program designed to reach desired caloric intake, while maintaining appropriate intake of nutrient and fiber, sodium and fats, and appropriate energy expenditure required for the weight goal.;Weight Management: Provide education and appropriate resources to help participant work on and attain dietary goals.    Admit Weight  171 lb (77.6 kg)    Goal Weight: Short Term  173 lb (78.5 kg)    Goal Weight: Long Term  176 lb (79.8 kg)    Expected Outcomes  Short Term: Continue to assess and modify interventions until short term weight is achieved;Long Term: Adherence to nutrition and physical activity/exercise program aimed toward attainment of established weight goal   DAve and his wife will talk with RD that has prescribed the tube feedings to see how to increase caloric intake.    Diabetes  Yes   The tube feedings that Waunita Schooner has been using have increased his blood glucose levels.    Intervention  Provide education about signs/symptoms and action to take for hypo/hyperglycemia.;Provide education about proper nutrition, including hydration, and aerobic/resistive exercise prescription along with prescribed medications to achieve blood glucose in normal ranges: Fasting glucose 65-99 mg/dL    Expected Outcomes  Short Term: Participant verbalizes understanding of the signs/symptoms and immediate care of hyper/hypoglycemia, proper foot care and importance of medication, aerobic/resistive exercise and nutrition  plan for blood glucose control.;Long Term: Attainment of HbA1C < 7%.    Lipids  Yes    Intervention  Provide education and support for participant on nutrition & aerobic/resistive exercise along with prescribed medications to achieve LDL <52m, HDL >446m    Expected Outcomes   Short Term: Participant states understanding of desired cholesterol values and is compliant with medications prescribed. Participant is following exercise prescription and nutrition guidelines.;Long Term: Cholesterol controlled with medications as prescribed, with individualized exercise RX and with personalized nutrition plan. Value goals: LDL < 7040mHDL > 40 mg.       Core Components/Risk Factors/Patient Goals Review:  Goals and Risk Factor Review    Row Name 04/03/18 1022 04/30/18 1100 05/21/18 1244 06/11/18 1319       Core Components/Risk Factors/Patient Goals Review   Personal Goals Review  Weight Management/Obesity;Hypertension;Diabetes;Lipids  Weight Management/Obesity;Hypertension;Diabetes;Lipids  Weight Management/Obesity;Hypertension;Diabetes;Lipids  Weight Management/Obesity;Hypertension;Diabetes;Lipids    Review  DavWaunita Schooners been doing well in rehab. His weight has steady and he is not losing any more. He and his wife monitoring his medications and diet (including fluids) closely.  His pressures have been good at home and they check daily.  He is doing well with his blood sugars and he checks them at home 2-3x a week.  He is feeling well overall.  He does not enjoy his tube, but makes the best of it.   DavWaunita Schooners been doing well in rehab.  He is staying steady with his weight at home with his PEG tube.  They continue to watch what goes in very closely including his fluid intake.   His blood sugars continue to do well and he checks them daily.  His blood pressures have been between 110s/70s and 130s/70s.  He is pleased with his numbers but wishes his recovery would speed up.   DavWaunita Schoonerntinues do well.  He would like to progress faster, but is doing what he can.  His weight and sugars continue to stay steady with his PEG tube.  His blood presssures have still been good.   He is going to be starting radiation again soon and I encouraged to continue to closely monitor all of these things.    Dave's  weight has continued to be steady.  His sugars and pressures all continue to be good.  He has done well with radiation and will follow up in June.  He still wants to gain more strength and we talked about protein intake and will have Melissa help as well.     Expected Outcomes  Short: Continue to monitor weight closely to make sure he doesn't lose.  Long: Continue to monitor his risk factors.   Short: Continue to maintain weight.  Long: Continue to monitor risk factors.   Short: Continue to watch weight carefully.  Long: Continue to monitor risk factors.   Short: Continue to maintain weight.  Long: Continue to monitor risk factors.        Core Components/Risk Factors/Patient Goals at Discharge (Final Review):  Goals and Risk Factor Review - 06/11/18 1319      Core Components/Risk Factors/Patient Goals Review   Personal Goals Review  Weight Management/Obesity;Hypertension;Diabetes;Lipids    Review  Dave's weight has continued to be steady.  His sugars and pressures all continue to be good.  He has done well with radiation and will follow up in June.  He still wants to gain more strength and we talked about protein intake and will have Melissa help as  well.     Expected Outcomes  Short: Continue to maintain weight.  Long: Continue to monitor risk factors.        ITP Comments: ITP Comments    Row Name 03/05/18 1225 03/21/18 0612 04/11/18 1142 04/18/18 1218 08/07/18 0950   ITP Comments  Medical review completed today. ITP sent to Dr. Loleta Chance for review,changes as needed and signature. Documentation of diagnosis can be found in [place] 1/22 office visit  30 day review. Continue with ITP unless directed changes by Medical Director chart review. New to program.  Our program is currently closed due to COVID-19.  We are communicating with patient via phone calls and emails.    30 day review. Continue with ITP unless directed changes by Medical Director chart review.  RD concerned no Dietitian  managing or following tube feeds. Pt and pt wife report aside from this cardiac rehab RD, no Dietitian has been following them. telephone encounter 07/05/2018 internal medicine in [place] clinic refers to nutritionist and having nutrition to advise, unsure who they are referring to; reached out 08/06/2018 asked receptionist - she said she will inquire and have them call back - no response yet as of today 7/14. Reached out to [facility] Dr. Theodoro Kalata office regarding concerns spoke to [facility], she reports that she will reach out to their Dietitian and get the patient managed. Wife called 7/14 after phone call with [facility] and reiterated that a dietitian should be following them - wife expressed concern and reiterates that aside from me no dietitian has been advising them- I told her I reached out to [facility] (Dr. Theodoro Kalata office) and [place] [facility] and that [facility] would reach out to them.   [place] Name 08/08/18 1415           ITP Comments  30 day review cycle restarting  after being closed since March 16 because of  Covid 19 pandemic. Program opened to patients on July 6. Not all have returned. ITP updated and sent to Medical Director for review,changes as needed and signature          Comments:

## 2018-08-08 NOTE — Progress Notes (Signed)
Daily Session Note  Patient Details  Name: Jermaine Campbell MRN: [id number] Date of Birth: [birth year] Referring Provider:     Cardiac Rehab from 03/05/2018 in [facility]  Referring Provider  Jermaine Deter MD      Encounter Date: 08/08/2018  Check In: Session Check In - 08/08/18 0953      Check-In   Supervising physician immediately available to respond to emergencies  See telemetry face sheet for immediately available ER MD    Location  [facility]-Cardiac & Pulmonary Rehab    Staff Present  Jermaine Lark, RN, BSN, CCRP; Jermaine Center, MA, RCEP, CCRP, CCET;Jermaine Campbell RCP,RRT,BSRT    Virtual Visit  No    Medication changes reported      No    Fall or balance concerns reported     No    Warm-up and Cool-down  Performed on first and last piece of equipment    Resistance Training Performed  Yes    VAD Patient?  No    PAD/SET Patient?  No      Pain Assessment   Currently in Pain?  No/denies          Social History   Tobacco Use  Smoking Status Never Smoker  Smokeless Tobacco Never Used    Goals Met:  Independence with exercise equipment Exercise tolerated well No report of cardiac concerns or symptoms Strength training completed today  Goals Unmet:  Not Applicable  Comments: Pt able to follow exercise prescription today without complaint.  Will continue to monitor for progression.  [place] Name 03/05/18 1422 08/08/18 1019       6 Minute Walk   Phase  Initial  Discharge    Distance  1245 feet  1744 feet    Distance % Change  -  28.6 %    Distance Feet Change  -  499 ft    Walk Time  6 minutes  6 minutes    # of Rest Breaks  0  0    MPH  2.36  3.3    METS  2.87  3.89    RPE  11  11    VO2 Peak  10.07  13.53    Symptoms  No  No    Resting HR  83 bpm  69 bpm    Resting BP  126/66  126/60    Resting Oxygen Saturation   98 %  -    Exercise Oxygen Saturation  during 6 min walk  94 %  -    Max Ex. HR  106 bpm  125 bpm    Max  Ex. BP  146/82  136/70    2 Minute Post BP  128/64  -       Dr. Emily Filbert is Medical Director for [name].

## 2018-08-09 ENCOUNTER — Encounter: Payer: Medicare Other | Admitting: *Deleted

## 2018-08-09 DIAGNOSIS — Z951 Presence of aortocoronary bypass graft: Secondary | ICD-10-CM | POA: Diagnosis not present

## 2018-08-09 NOTE — Progress Notes (Signed)
Discharge Progress Report  Patient Details  Name: TORI CUPPS MRN: [id number] Date of Birth: 1945/11/21 Referring Provider:     Cardiac Rehab from 03/05/2018 in [facility]iac [facility] Re[facility]  Referring Provider  Dwaine Deter MD       Number of Visits: 18/36  Reason for Discharge:  Patient reached a stable level of exercise. Patient independent in their exercise. Patient has met program and personal goals. Early Exit:  Personal  Smoking History:  Social History   Tobacco Use  Smoking Status Never Smoker  Smokeless Tobacco Never Used    Diagnosis:  S/P CABG (coronary artery bypass graft)  ADL UCSD:   Initial Exercise Prescription: Initial Exercise Prescription - 03/05/18 1400      Date of Initial Exercise RX and Referring Provider   Date  03/05/18    Referring Provider  Dwaine Deter MD      Treadmill   MPH  2.2    Grade  0.5    Minutes  15    METs  2.84      NuStep   Level  2    SPM  80    Minutes  15    METs  2.5      REL-XR   Level  1    Speed  50    Minutes  15    METs  2.5      Prescription Details   Frequency (times per week)  2    Duration  Progress to 30 minutes of continuous aerobic without signs/symptoms of physical distress      Intensity   THRR 40-80% of Max Heartrate  108-133    Ratings of Perceived Exertion  11-13    Perceived Dyspnea  0-4      Progression   Progression  Continue progressive overload as per policy without signs/symptoms or physical distress.      Resistance Training   Training Prescription  Yes    Weight  3 lbs    Reps  10-15       Discharge Exercise Prescription (Final Exercise Prescription Changes): Exercise Prescription Changes - 04/11/18 1100      Response to Exercise   Blood Pressure (Admit)  94/62    Blood Pressure (Exercise)  138/60    Blood Pressure (Exit)  126/70    Heart Rate (Admit)  89 bpm    Heart Rate (Exercise)  119 bpm    Heart Rate (Exit)  90 bpm    Rating of Perceived  Exertion (Exercise)  13    Symptoms  none    Duration  Continue with 30 min of aerobic exercise without signs/symptoms of physical distress.    Intensity  THRR unchanged      Progression   Progression  Continue to progress workloads to maintain intensity without signs/symptoms of physical distress.    Average METs  2.68      Resistance Training   Training Prescription  Yes    Weight  3 lbs    Reps  10-15      Interval Training   Interval Training  No      Treadmill   MPH  2.2    Grade  0.5    Minutes  15    METs  2.84      NuStep   Level  2    Minutes  15    METs  2.3      REL-XR   Level  1    Minutes  15    METs  2.9      Home Exercise Plan   Plans to continue exercise at  Home (comment)   walking and [employer]   Frequency  Add 3 additional days to program exercise sessions.    Initial Home Exercises Provided  03/27/18       Functional Capacity: 6 Minute Walk    Row Name 03/05/18 1422 08/08/18 1019       6 Minute Walk   Phase  Initial  Discharge    Distance  1245 feet  1744 feet    Distance % Change  -  28.6 %    Distance Feet Change  -  499 ft    Walk Time  6 minutes  6 minutes    # of Rest Breaks  0  0    MPH  2.36  3.3    METS  2.87  3.89    RPE  11  11    VO2 Peak  10.07  13.53    Symptoms  No  No    Resting HR  83 bpm  69 bpm    Resting BP  126/66  126/60    Resting Oxygen Saturation   98 %  -    Exercise Oxygen Saturation  during 6 min walk  94 %  -    Max Ex. HR  106 bpm  125 bpm    Max Ex. BP  146/82  136/70    2 Minute Post BP  128/64  -       Psychological, QOL, Others - Outcomes: PHQ 2/9: Depression screen [facility] 2/9 08/08/2018 04/03/2018 03/05/2018  Decreased Interest 2 0 3  Down, Depressed, Hopeless 0 0 1  PHQ - 2 Score 2 0 4  Altered sleeping 0 0 2  Tired, decreased energy _0 Change in appetite 0 0 0  Feeling bad or failure about yourself  0 0 0  Trouble concentrating 0 1 0  Moving slowly or fidgety/restless 0 1 0  Suicidal  thoughts 0 0 0  PHQ-9 Score _1 Difficult doing work/chores Somewhat difficult Somewhat difficult Somewhat difficult    Quality of Life: Quality of Life - 08/08/18 1136      Quality of Life Scores   Health/Function Post  23.75 %    Socioeconomic Post  25.93 %    Psych/Spiritual Post  27.36 %    Family Post  30 %    GLOBAL Post  25.92 %       Personal Goals: Goals established at orientation with interventions provided to work toward goal. Personal Goals and Risk Factors at Admission - 03/05/18 1323      Core Components/Risk Factors/Patient Goals on Admission    Weight Management  Weight Gain;Yes    Intervention  Weight Management: Develop a combined nutrition and exercise program designed to reach desired caloric intake, while maintaining appropriate intake of nutrient and fiber, sodium and fats, and appropriate energy expenditure required for the weight goal.;Weight Management: Provide education and appropriate resources to help participant work on and attain dietary goals.    Admit Weight  171 lb (77.6 kg)    Goal Weight: Short Term  173 lb (78.5 kg)    Goal Weight: Long Term  176 lb (79.8 kg)    Expected Outcomes  Short Term: Continue to assess and modify interventions until short term weight is achieved;Long Term: Adherence to nutrition and physical activity/exercise program aimed toward attainment  of established weight goal   DAve and his wife will talk with RD that has prescribed the tube feedings to see how to increase caloric intake.    Diabetes  Yes   The tube feedings that Waunita Schooner has been using have increased his blood glucose levels.    Intervention  Provide education about signs/symptoms and action to take for hypo/hyperglycemia.;Provide education about proper nutrition, including hydration, and aerobic/resistive exercise prescription along with prescribed medications to achieve blood glucose in normal ranges: Fasting glucose 65-99 mg/dL    Expected Outcomes  Short Term:  Participant verbalizes understanding of the signs/symptoms and immediate care of hyper/hypoglycemia, proper foot care and importance of medication, aerobic/resistive exercise and nutrition plan for blood glucose control.;Long Term: Attainment of HbA1C < 7%.    Lipids  Yes    Intervention  Provide education and support for participant on nutrition & aerobic/resistive exercise along with prescribed medications to achieve LDL <23m, HDL >464m    Expected Outcomes  Short Term: Participant states understanding of desired cholesterol values and is compliant with medications prescribed. Participant is following exercise prescription and nutrition guidelines.;Long Term: Cholesterol controlled with medications as prescribed, with individualized exercise RX and with personalized nutrition plan. Value goals: LDL < 7046mHDL > 40 mg.        Personal Goals Discharge: Goals and Risk Factor Review    Row Name 04/03/18 1022 04/30/18 1100 05/21/18 1244 06/11/18 1319       Core Components/Risk Factors/Patient Goals Review   Personal Goals Review  Weight Management/Obesity;Hypertension;Diabetes;Lipids  Weight Management/Obesity;Hypertension;Diabetes;Lipids  Weight Management/Obesity;Hypertension;Diabetes;Lipids  Weight Management/Obesity;Hypertension;Diabetes;Lipids    Review  DavWaunita Schooners been doing well in rehab. His weight has steady and he is not losing any more. He and his wife monitoring his medications and diet (including fluids) closely.  His pressures have been good at home and they check daily.  He is doing well with his blood sugars and he checks them at home 2-3x a week.  He is feeling well overall.  He does not enjoy his tube, but makes the best of it.   DavWaunita Schooners been doing well in rehab.  He is staying steady with his weight at home with his PEG tube.  They continue to watch what goes in very closely including his fluid intake.   His blood sugars continue to do well and he checks them daily.  His blood  pressures have been between 110s/70s and 130s/70s.  He is pleased with his numbers but wishes his recovery would speed up.   DavWaunita Schoonerntinues do well.  He would like to progress faster, but is doing what he can.  His weight and sugars continue to stay steady with his PEG tube.  His blood presssures have still been good.   He is going to be starting radiation again soon and I encouraged to continue to closely monitor all of these things.    Dave's weight has continued to be steady.  His sugars and pressures all continue to be good.  He has done well with radiation and will follow up in June.  He still wants to gain more strength and we talked about protein intake and will have Melissa help as well.     Expected Outcomes  Short: Continue to monitor weight closely to make sure he doesn't lose.  Long: Continue to monitor his risk factors.   Short: Continue to maintain weight.  Long: Continue to monitor risk factors.   Short: Continue to watch weight carefully.  Long: Continue to monitor risk factors.   Short: Continue to maintain weight.  Long: Continue to monitor risk factors.        Exercise Goals and Review: Exercise Goals    Row Name 03/05/18 1428             Exercise Goals   Increase Physical Activity  Yes       Intervention  Provide advice, education, support and counseling about physical activity/exercise needs.;Develop an individualized exercise prescription for aerobic and resistive training based on initial evaluation findings, risk stratification, comorbidities and participant's personal goals.       Expected Outcomes  Short Term: Attend rehab on a regular basis to increase amount of physical activity.;Long Term: Exercising regularly at least 3-5 days a week.;Long Term: Add in home exercise to make exercise part of routine and to increase amount of physical activity.       Increase Strength and Stamina  Yes       Intervention  Provide advice, education, support and counseling about physical  activity/exercise needs.;Develop an individualized exercise prescription for aerobic and resistive training based on initial evaluation findings, risk stratification, comorbidities and participant's personal goals.       Expected Outcomes  Short Term: Increase workloads from initial exercise prescription for resistance, speed, and METs.;Short Term: Perform resistance training exercises routinely during rehab and add in resistance training at home;Long Term: Improve cardiorespiratory fitness, muscular endurance and strength as measured by increased METs and functional capacity (6MWT)       Able to understand and use rate of perceived exertion (RPE) scale  Yes       Intervention  Provide education and explanation on how to use RPE scale       Expected Outcomes  Short Term: Able to use RPE daily in rehab to express subjective intensity level;Long Term:  Able to use RPE to guide intensity level when exercising independently       Knowledge and understanding of Target Heart Rate Range (THRR)  Yes       Intervention  Provide education and explanation of THRR including how the numbers were predicted and where they are located for reference       Expected Outcomes  Short Term: Able to state/look up THRR;Short Term: Able to use daily as guideline for intensity in rehab;Long Term: Able to use THRR to govern intensity when exercising independently       Able to check pulse independently  Yes       Intervention  Provide education and demonstration on how to check pulse in carotid and radial arteries.;Review the importance of being able to check your own pulse for safety during independent exercise       Expected Outcomes  Long Term: Able to check pulse independently and accurately;Short Term: Able to explain why pulse checking is important during independent exercise       Understanding of Exercise Prescription  Yes       Intervention  Provide education, explanation, and written materials on patient's individual  exercise prescription       Expected Outcomes  Short Term: Able to explain program exercise prescription;Long Term: Able to explain home exercise prescription to exercise independently          Exercise Goals Re-Evaluation: Exercise Goals Re-Evaluation    Row Name 03/20/18 1540 03/27/18 1617 04/03/18 1016 04/11/18 1145 04/30/18 1111     Exercise Goal Re-Evaluation   Exercise Goals Review  Increase Physical Activity;Increase Strength and Stamina;Knowledge and  understanding of Target Heart Rate Range (THRR);Able to understand and use rate of perceived exertion (RPE) scale;Understanding of Exercise Prescription  Increase Physical Activity;Increase Strength and Stamina;Able to understand and use rate of perceived exertion (RPE) scale;Knowledge and understanding of Target Heart Rate Range (THRR);Able to check pulse independently;Understanding of Exercise Prescription  Increase Physical Activity;Increase Strength and Stamina;Understanding of Exercise Prescription  Increase Physical Activity;Increase Strength and Stamina;Understanding of Exercise Prescription  Increase Physical Activity;Increase Strength and Stamina;Understanding of Exercise Prescription   Comments  Reviewed RPE scale, THR and program prescription with pt today.  Pt voiced understanding and was given a copy of goals to take home.   Reviewed home exercise with pt today.  Pt plans to continue walking at home for exercise.  He also has access to the [facility] for exercise on bad weather days.  Reviewed THR, pulse, RPE, sign and symptoms, and when to call 911 or MD.  Also discussed weather considerations and indoor options.  Pt voiced understanding.  Waunita Schooner has been doing well in rehab.  He has been getting his home exercise.  [facility] He is recovering his strength and stamina.   Waunita Schooner continues to do well in rehab.  He will be ready to start to increase workloads upon return.  He is planning to walk and use videos at home.  We will  continue to monitor his progress at home.   Waunita Schooner has been walking at home.  He is up to 1.2 miles a day.  He would like to work up to 2 miles a day, but is still tired after walking.  I encouraged him to continue to work towards his goal slowly.  He mentioned that he was sore on Saturday after doing weights on Friday.  He is trying bigger ranges of motions and has been doing weights five days a week.  I encouraged him to take a couple days off from weights and to try again with it.  He has enjoyed our videos and has added them to his routine. His favorite parts our antics and [place] just being ourselves.    Expected Outcomes  Short: Use RPE daily to regulate intensity. Long: Follow program prescription in THR.  Short: Start to add in walking at home on off days.  Long: Continue to improve strength and stamina.   Short: Continue to exercise on his off days.  Long: Continue to build his strength and stamina.   Short: Continue to exercise by walking.  Long: Continue to build strength and stamina.   Short: Take a break from weights then get back to them to help with soreness.  Long: Continue to walk daily.    [place] Name 05/21/18 1230 06/11/18 1314           Exercise Goal Re-Evaluation   Exercise Goals Review  Increase Physical Activity;Increase Strength and Stamina;Understanding of Exercise Prescription  Increase Physical Activity;Increase Strength and Stamina;Understanding of Exercise Prescription      Comments  Waunita Schooner continues to walk at home.  He now up to 1.5 miles a day.  He still is not where he would like to be and that can get him discouraged.  He would like to progress faster.  He is using our videos for warm up, weights, and cool down stretches as well.  I encouraged him to use all of the different weight workouts to provide a good variety to his routine.   When I called Waunita Schooner had been out walking.  He is still doing 1.5 miles a day.  Today it took him 28 mins. He said that when he gets back he is tired but  not worn out. He has continued to watch our videos and try them out., then he deletes them.  He asked about increasing his strength again, as it is not where he wants to be.  He has been using the weigth exercises from the home exercise packet.  I encouraged him to reuse our vidoes that we send  to try get some more varitey. I resent all of the links to the weight videos again.  We also talked about making sure that he is getting enough protein in his diet and have reached out to [facility] to help with this as well.       Expected Outcomes  Short: Vary up weight workouts.  Long: Continue to walk daily.   Short: Add more variety to weight workouts.  Long: Continue to walk to maintain stamina.          Nutrition & Weight - Outcomes: Pre Biometrics - 03/05/18 1431      Pre Biometrics   Height  _0  (1.753 m)    Weight  171 lb 1.6 oz (77.6 kg)    Waist Circumference  37 inches    Hip Circumference  42 inches    Waist to Hip Ratio  0.88 %    BMI (Calculated)  25.26    Single Leg Stand  2.69 seconds      Post Biometrics - 08/08/18 1021       Post  Biometrics   Height  _1  (1.753 m)    Weight  170 lb 3.2 oz (77.2 kg)    BMI (Calculated)  25.12    Single Leg Stand  4.72 seconds       Nutrition: Nutrition Therapy & Goals - 04/24/18 1325      Nutrition Therapy   Diet  Waunita Schooner is on G Tube feedings. His wife will be consulting with the RD that has set up the tube feedings. On Osmolite 1.5 (6 cartons/day): 2130kcal, 1057m Water, 89.4g pro; 2444mfree water flushes with meals (meals 4x/day) = 20863mater/day. Needs 1850-2150kcal/day needs (MSJ x 1.2-1.3, 80-90g pro.     Protein (specify units)  80-90g      Personal Nutrition Goals   Nutrition Goal  ST: miralax 1 scoop per day (originally 1 scoop every other): rememeber to take with liquid. LT: continue tube feeds Osmolite 1.5 (6 cartons/day) with 240 FW flushes per meal; 4 meals.     Comments  Pt tolerating tube feeds and maintaining weight;  pt will often be constipated, sugested changing to miralax everyday. Pt controlling BG well will formula and metformin. Pt will eat small bites of calorie dense food, but not much. Pt has been drinking some orally. [facility] his fluids are fine with increases PA from [employer]. [facility] or [facility] about feeding schedule and plan.       Intervention Plan   Intervention  Prescribe, educate and counsel regarding individualized specific dietary modifications aiming towards targeted core components such as weight, hypertension, lipid management, diabetes, heart failure and other comorbidities.    Expected Outcomes  Short Term Goal: Understand basic principles of dietary content, such as calories, fat, sodium, cholesterol and nutrients.;Short Term Goal: A plan has been developed with personal nutrition goals set during dietitian appointment.;Long Term Goal: Adherence to prescribed nutrition  plan.       Nutrition Discharge: Nutrition Assessments - 08/08/18 1136      MEDFICTS Scores   Post Score  --   unable to fill out- tube feed      Education Questionnaire Score: Knowledge Questionnaire Score - 08/08/18 1136      Knowledge Questionnaire Score   Post Score  25/26       Goals reviewed with patient; copy given to patient.

## 2018-08-09 NOTE — Progress Notes (Signed)
Cardiac Individual Treatment Plan  Patient Details  Name: Jermaine Campbell MRN: [id number] Date of Birth: 20-Jul-1945 Referring Provider:     Cardiac Rehab from 03/05/2018 in [facility] Cardiac and Pulmonary Rehab  Referring Provider  Dwaine Deter MD      Initial Encounter Date:    Cardiac Rehab from 03/05/2018 in [facility] Cardiac and Pulmonary Rehab  Date  03/05/18      Visit Diagnosis: S/P CABG (coronary artery bypass graft)  Patient's Home Medications on Admission:  Current Outpatient Medications:  .  ALPRAZolam (XANAX) 0.5 MG tablet, Place 0.25 mg into feeding tube at bedtime as needed for sleep., Disp: , Rfl:  .  atorvastatin (LIPITOR) 80 MG tablet, Place 80 mg into feeding tube daily., Disp: , Rfl:  .  bicalutamide (CASODEX) 50 MG tablet, Take 50 mg by mouth daily., Disp: , Rfl: 2 .  docusate sodium (COLACE) 100 MG capsule, Take 1 capsule (100 mg total) by mouth 2 (two) times daily. (Patient not taking: Reported on 03/05/2018), Disp: 60 capsule, Rfl: 0 .  fluticasone (FLONASE) 50 MCG/ACT nasal spray, Place 1 spray into both nostrils as needed. , Disp: , Rfl:  .  HYDROcodone-acetaminophen (NORCO/VICODIN) 5-325 MG tablet, Take 1-2 tablets by mouth every 6 (six) hours as needed for moderate pain. (Patient not taking: Reported on 03/05/2018), Disp: 10 tablet, Rfl: 0 .  ibuprofen (ADVIL,MOTRIN) 200 MG tablet, Take 200 mg by mouth every 6 (six) hours as needed., Disp: , Rfl:  .  isosorbide dinitrate (ISORDIL) 10 MG tablet, Place 0.5 mg into feeding tube 2 (two) times daily., Disp: , Rfl:  .  leuprolide (LUPRON) 1 MG/0.2ML injection, Inject into the skin every 3 (three) months. , Disp: , Rfl:  .  levothyroxine (SYNTHROID, LEVOTHROID) 100 MCG tablet, Take 100 mcg by mouth daily before breakfast. , Disp: , Rfl:  .  metFORMIN (GLUCOPHAGE) 500 MG tablet, Place 500 mg into feeding tube 2 (two) times daily with a meal., Disp: , Rfl:  .  metoprolol tartrate (LOPRESSOR) 25 MG tablet, Take 12.5 mg by  mouth 2 (two) times daily. , Disp: , Rfl:  .  ondansetron (ZOFRAN) 4 MG tablet, Place 4 mg into feeding tube every 8 (eight) hours as needed for nausea or vomiting., Disp: , Rfl:  .  polyethylene glycol (MIRALAX / GLYCOLAX) packet, Take 17 g by mouth daily., Disp: , Rfl:  .  rivaroxaban (XARELTO) 10 MG TABS tablet, Take 10 mg by mouth every evening. , Disp: , Rfl:  .  simvastatin (ZOCOR) 40 MG tablet, Take 40 mg by mouth daily. , Disp: , Rfl:  .  tamsulosin (FLOMAX) 0.4 MG CAPS capsule, Take 1 capsule (0.4 mg total) by mouth daily. (Patient not taking: Reported on 08/21/2017), Disp: 30 capsule, Rfl: 0 .  vitamin B-12 (CYANOCOBALAMIN) 1000 MCG tablet, Take 1,000 mcg by mouth daily., Disp: , Rfl:   Past Medical History: Past Medical History:  Diagnosis Date  . Anemia   . Bradycardia   . 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 Cancer of prostate ([place]) 2019  . Coronary artery disease    atherosclerosis of native coronary artery of native heart without angina pectoris  . Deaf    Left ear  . Dysphagia    history of d/t throat cancer  . Dysrhythmia    paroxysmal atrial fibrillation, bradycardia  . H/O syncope    several yrs ago  . History of  BPH   . Hypercholesteremia   . Hypertension   . Hypothyroidism    s/p radiation tx - throat CA  . Myocardial infarction ([place])    1996  . Neck stiffness    s/p radiation tx - Throat CA  . Seizures ([place])    last one 2 yrs ago. not medicated. d/t injury to frontal lobe  . Vertigo    no episodes 4-5 yrs/dizziness  . Wears hearing aid    Right ear/ deaf in left ear    Tobacco Use: Social History   Tobacco Use  Smoking Status Never Smoker  Smokeless Tobacco Never Used    Labs: Recent Review Flowsheet Data    There is no flowsheet data to display.       Exercise Target Goals: Exercise Program Goal: Individual exercise prescription set using results from initial 6 min walk test  and THRR while considering  patient's activity barriers and safety.   Exercise Prescription Goal: Initial exercise prescription builds to 30-45 minutes a day of aerobic activity, 2-3 days per week.  Home exercise guidelines will be given to patient during program as part of exercise prescription that the participant will acknowledge.  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 HR  106 bpm  125 bpm    Max Ex. BP  146/82  136/70    2 Minute Post BP  128/64  -       Oxygen Initial Assessment:   Oxygen Re-Evaluation:   Oxygen Discharge (Final Oxygen Re-Evaluation):   Initial Exercise Prescription: Initial Exercise Prescription - 03/05/18 1400      Date of Initial Exercise RX and Referring Provider   Date  03/05/18    Referring Provider  Dwaine Deter MD      Treadmill   MPH  2.2    Grade  0.5    Minutes  15    METs  2.84      NuStep   Level  2    SPM  80    Minutes  15    METs  2.5      REL-XR   Level  1  Speed  50    Minutes  15    METs  2.5      Prescription Details   Frequency  (times per week)  2    Duration  Progress to 30 minutes of continuous aerobic without signs/symptoms of physical distress      Intensity   THRR 40-80% of Max Heartrate  108-133    Ratings of Perceived Exertion  11-13    Perceived Dyspnea  0-4      Progression   Progression  Continue progressive overload as per policy without signs/symptoms or physical distress.      Resistance Training   Training Prescription  Yes    Weight  3 lbs    Reps  10-15       Perform Capillary Blood Glucose checks as needed.  Exercise Prescription Changes: Exercise Prescription Changes    Row Name 03/05/18 1300 03/27/18 1600 03/28/18 1400 04/11/18 1100       Response to Exercise   Blood Pressure (Admit)  126/66  -  94/70  94/62    Blood Pressure (Exercise)  146/82  -  146/72  138/60    Blood Pressure (Exit)  128/64  -  106/60  126/70    Heart Rate (Admit)  93 bpm  -  95 bpm  89 bpm    Heart Rate (Exercise)  106 bpm  -  106 bpm  119 bpm    Heart Rate (Exit)  87 bpm  -  94 bpm  90 bpm    Oxygen Saturation (Admit)  98 %  -  -  -    Oxygen Saturation (Exercise)  94 %  -  -  -    Rating of Perceived Exertion (Exercise)  11  -  11  13    Symptoms  none  -  none  none    Comments  walk test results  -  -  -    Duration  -  -  Continue with 30 min of aerobic exercise without signs/symptoms of physical distress.  Continue with 30 min of aerobic exercise without signs/symptoms of physical distress.    Intensity  -  -  THRR unchanged  THRR unchanged      Progression   Progression  -  -  Continue to progress workloads to maintain intensity without signs/symptoms of physical distress.  Continue to progress workloads to maintain intensity without signs/symptoms of physical distress.    Average METs  -  -  2.64  2.68      Resistance Training   Training Prescription  -  -  Yes  Yes    Weight  -  -  3 lbs  3 lbs    Reps  -  -  10-15  10-15      Interval Training   Interval Training  -  -  No  No       Treadmill   MPH  -  -  2.2  2.2    Grade  -  -  0.5  0.5    Minutes  -  -  15  15    METs  -  -  2.84  2.84      NuStep   Level  -  -  2  2    Minutes  -  -  15  15    METs  -  -  2  2.3      REL-XR   Level  -  -  1  1    Minutes  -  -  15  15    METs  -  -  3.1  2.9      Home Exercise Plan   Plans to continue exercise at  -  Home (comment) walking and [employer]  Home (comment) walking and [employer]  Home (comment) walking and [employer]    Frequency  -  Add 3 additional days to program exercise sessions.  Add 3 additional days to program exercise sessions.  Add 3 additional days to program exercise sessions.    Initial Home Exercises Provided  -  03/27/18  03/27/18  03/27/18       Exercise Comments: Exercise Comments    Row Name 03/20/18 5885 08/09/18 1029         Exercise Comments  First full day of exercise!  Patient was oriented to gym and equipment including functions, settings, policies, and procedures.  Patient's individual exercise prescription and treatment plan were reviewed.  All starting workloads were established based on the results of the 6 minute walk test done at initial orientation visit.  The plan for exercise progression was also introduced and progression will be customized based on patient's performance and goals.  Dickey graduated today from  rehab with 18 sessions completed.  Details of the patient's exercise prescription and what He needs to do in order to continue the prescription and progress were discussed with patient.  Patient was given a copy of prescription and goals.  Patient verbalized understanding.  Jathan plans to continue to exercise by walking at home.         Exercise Goals and Review: Exercise Goals    Row Name 03/05/18 1428             Exercise Goals   Increase Physical Activity  Yes       Intervention  Provide advice, education, support and counseling about physical activity/exercise needs.;Develop an individualized exercise prescription for aerobic  and resistive training based on initial evaluation findings, risk stratification, comorbidities and participant's personal goals.       Expected Outcomes  Short Term: Attend rehab on a regular basis to increase amount of physical activity.;Long Term: Exercising regularly at least 3-5 days a week.;Long Term: Add in home exercise to make exercise part of routine and to increase amount of physical activity.       Increase Strength and Stamina  Yes       Intervention  Provide advice, education, support and counseling about physical activity/exercise needs.;Develop an individualized exercise prescription for aerobic and resistive training based on initial evaluation findings, risk stratification, comorbidities and participant's personal goals.       Expected Outcomes  Short Term: Increase workloads from initial exercise prescription for resistance, speed, and METs.;Short Term: Perform resistance training exercises routinely during rehab and add in resistance training at home;Long Term: Improve cardiorespiratory fitness, muscular endurance and strength as measured by increased METs and functional capacity (6MWT)       Able to understand and use rate of perceived exertion (RPE) scale  Yes       Intervention  Provide education and explanation on how to use RPE scale       Expected Outcomes  Short Term: Able to use RPE daily in rehab to express subjective intensity level;Long Term:  Able to use RPE to guide intensity level when exercising independently       Knowledge and understanding of Target Heart Rate Range (THRR)  Yes  Intervention  Provide education and explanation of THRR including how the numbers were predicted and where they are located for reference       Expected Outcomes  Short Term: Able to state/look up THRR;Short Term: Able to use daily as guideline for intensity in rehab;Long Term: Able to use THRR to govern intensity when exercising independently       Able to check pulse independently  Yes        Intervention  Provide education and demonstration on how to check pulse in carotid and radial arteries.;Review the importance of being able to check your own pulse for safety during independent exercise       Expected Outcomes  Long Term: Able to check pulse independently and accurately;Short Term: Able to explain why pulse checking is important during independent exercise       Understanding of Exercise Prescription  Yes       Intervention  Provide education, explanation, and written materials on patient's individual exercise prescription       Expected Outcomes  Short Term: Able to explain program exercise prescription;Long Term: Able to explain home exercise prescription to exercise independently          Exercise Goals Re-Evaluation : Exercise Goals Re-Evaluation    Row Name 03/20/18 2993 03/27/18 1617 04/03/18 1016 04/11/18 1145 04/30/18 1111     Exercise Goal Re-Evaluation   Exercise Goals Review  Increase Physical Activity;Increase Strength and Stamina;Knowledge and understanding of Target Heart Rate Range (THRR);Able to understand and use rate of perceived exertion (RPE) scale;Understanding of Exercise Prescription  Increase Physical Activity;Increase Strength and Stamina;Able to understand and use rate of perceived exertion (RPE) scale;Knowledge and understanding of Target Heart Rate Range (THRR);Able to check pulse independently;Understanding of Exercise Prescription  Increase Physical Activity;Increase Strength and Stamina;Understanding of Exercise Prescription  Increase Physical Activity;Increase Strength and Stamina;Understanding of Exercise Prescription  Increase Physical Activity;Increase Strength and Stamina;Understanding of Exercise Prescription   Comments  Reviewed RPE scale, THR and program prescription with pt today.  Pt voiced understanding and was given a copy of goals to take home.   Reviewed home exercise with pt today.  Pt plans to continue walking at home for exercise.   He also has access to the [facility] for exercise on bad weather days.  Reviewed THR, pulse, RPE, sign and symptoms, and when to call 911 or MD.  Also discussed weather considerations and indoor options.  Pt voiced understanding.  Waunita Schooner has been doing well in rehab.  He has been getting his home exercise.  [facility] He is recovering his strength and stamina.   Waunita Schooner continues to do well in rehab.  He will be ready to start to increase workloads upon return.  He is planning to walk and use videos at home.  We will continue to monitor his progress at home.   Waunita Schooner has been walking at home.  He is up to 1.2 miles a day.  He would like to work up to 2 miles a day, but is still tired after walking.  I encouraged him to continue to work towards his goal slowly.  He mentioned that he was sore on Saturday after doing weights on Friday.  He is trying bigger ranges of motions and has been doing weights five days a week.  I encouraged him to take a couple days off from weights and to try again with it.  He has enjoyed our videos and has added them to  his routine. His favorite parts our antics and [place] just being ourselves.    Expected Outcomes  Short: Use RPE daily to regulate intensity. Long: Follow program prescription in THR.  Short: Start to add in walking at home on off days.  Long: Continue to improve strength and stamina.   Short: Continue to exercise on his off days.  Long: Continue to build his strength and stamina.   Short: Continue to exercise by walking.  Long: Continue to build strength and stamina.   Short: Take a break from weights then get back to them to help with soreness.  Long: Continue to walk daily.    [place] Name 05/21/18 1230 06/11/18 1314           Exercise Goal Re-Evaluation   Exercise Goals Review  Increase Physical Activity;Increase Strength and Stamina;Understanding of Exercise Prescription  Increase Physical Activity;Increase Strength and Stamina;Understanding of Exercise  Prescription      Comments  Waunita Schooner continues to walk at home.  He now up to 1.5 miles a day.  He still is not where he would like to be and that can get him discouraged.  He would like to progress faster.  He is using our videos for warm up, weights, and cool down stretches as well.  I encouraged him to use all of the different weight workouts to provide a good variety to his routine.   When I called Waunita Schooner had been out walking.  He is still doing 1.5 miles a day.  Today it took him 28 mins. He said that when he gets back he is tired but not worn out. He has continued to watch our videos and try them out., then he deletes them.  He asked about increasing his strength again, as it is not where he wants to be.  He has been using the weigth exercises from the home exercise packet.  I encouraged him to reuse our vidoes that we send  to try get some more varitey. I resent all of the links to the weight videos again.  We also talked about making sure that he is getting enough protein in his diet and have reached out to [facility] to help with this as well.       Expected Outcomes  Short: Vary up weight workouts.  Long: Continue to walk daily.   Short: Add more variety to weight workouts.  Long: Continue to walk to maintain stamina.          Discharge Exercise Prescription (Final Exercise Prescription Changes): Exercise Prescription Changes - 04/11/18 1100      Response to Exercise   Blood Pressure (Admit)  94/62    Blood Pressure (Exercise)  138/60    Blood Pressure (Exit)  126/70    Heart Rate (Admit)  89 bpm    Heart Rate (Exercise)  119 bpm    Heart Rate (Exit)  90 bpm    Rating of Perceived Exertion (Exercise)  13    Symptoms  none    Duration  Continue with 30 min of aerobic exercise without signs/symptoms of physical distress.    Intensity  THRR unchanged      Progression   Progression  Continue to progress workloads to maintain intensity without signs/symptoms of physical distress.    Average METs   2.68      Resistance Training   Training Prescription  Yes    Weight  3 lbs    Reps  10-15      Interval  Training   Interval Training  No      Treadmill   MPH  2.2    Grade  0.5    Minutes  15    METs  2.84      NuStep   Level  2    Minutes  15    METs  2.3      REL-XR   Level  1    Minutes  15    METs  2.9      Home Exercise Plan   Plans to continue exercise at  Home (comment)   walking and [employer]   Frequency  Add 3 additional days to program exercise sessions.    Initial Home Exercises Provided  03/27/18       Nutrition:  Target Goals: Understanding of nutrition guidelines, daily intake of sodium <1515m, cholesterol <2074m calories 30% from fat and 7% or less from saturated fats, daily to have 5 or more servings of fruits and vegetables.  Biometrics: Pre Biometrics - 03/05/18 1431      Pre Biometrics   Height  _0  (1.753 m)    Weight  171 lb 1.6 oz (77.6 kg)    Waist Circumference  37 inches    Hip Circumference  42 inches    Waist to Hip Ratio  0.88 %    BMI (Calculated)  25.26    Single Leg Stand  2.69 seconds      Post Biometrics - 08/08/18 1021       Post  Biometrics   Height  _1  (1.753 m)    Weight  170 lb 3.2 oz (77.2 kg)    BMI (Calculated)  25.12    Single Leg Stand  4.72 seconds       Nutrition Therapy Plan and Nutrition Goals: Nutrition Therapy & Goals - 04/24/18 1325      Nutrition Therapy   Diet  DaWaunita Schooners on G Tube feedings. His wife will be consulting with the RD that has set up the tube feedings. On Osmolite 1.5 (6 cartons/day): 2130kcal, 108677mater, 89.4g pro; 240m55mee water flushes with meals (meals 4x/day) = 2086ml99mer/day. Needs 1850-2150kcal/day needs (MSJ x 1.2-1.3, 80-90g pro.     Protein (specify units)  80-90g      Personal Nutrition Goals   Nutrition Goal  ST: miralax 1 scoop per day (originally 1 scoop every other): rememeber to take with liquid. LT: continue tube feeds Osmolite 1.5 (6 cartons/day) with 240 FW  flushes per meal; 4 meals.     Comments  Pt tolerating tube feeds and maintaining weight; pt will often be constipated, sugested changing to miralax everyday. Pt controlling BG well will formula and metformin. Pt will eat small bites of calorie dense food, but not much. Pt has been drinking some orally. [facility] his fluids are fine with increases PA from [facility]. [facility] and plan.       Intervention Plan   Intervention  Prescribe, educate and counsel regarding individualized specific dietary modifications aiming towards targeted core components such as weight, hypertension, lipid management, diabetes, heart failure and other comorbidities.    Expected Outcomes  Short Term Goal: Understand basic principles of dietary content, such as calories, fat, sodium, cholesterol and nutrients.;Short Term Goal: A plan has been developed with personal nutrition goals set during dietitian appointment.;Long Term Goal: Adherence to prescribed nutrition plan.  Nutrition Assessments: Nutrition Assessments - 08/08/18 1136      MEDFICTS Scores   Post Score  --   unable to fill out- tube feed      Nutrition Goals Re-Evaluation: Nutrition Goals Re-Evaluation    [place] Name 04/03/18 1019 06/19/18 1114 08/02/18 1129 08/06/18 1041       Goals   Current Weight  -  178 lb (80.7 kg)  -  170 lb 12.8 oz (77.5 kg)    Nutrition Goal  -  ST: continue to tolerate TF LT: continue tube feeds Osmolite 1.5 (6 cartons/day) with 240 FW flushes per meal; 4 meals.   ST: continue to tolerate TF LT: continue tube feeds Osmolite 1.5 (6 cartons/day) with 240 FW flushes per meal; 4 meals.  ST: continue to tolerate TF LT: continue tube feeds Osmolite 1.5 (6 cartons/day) with 240 FW flushes per meal; 4 meals. Gain weight (pt request to be about 180lbs) and muscle    Comment  Waunita Schooner is currently on a feeding tube since surgery.  He uses a nutritional paste.  He does not  feel hungry or lack in appetite.   His protein needs are 85-93g (cancer, not in treatment), if treatment begins or inadequate protein indicators present themselves needs would increase to 93-108g; at that time he would need a protein modulator such as promod or prostat in addition to his tube feeds to be taken orally or through TF. Discussed with wife what that would look like. Pt reports that he takes miralax when he is backed up 2-3 days and when he does use the bathroom it is easy to get out. Wife reported pt had miralax every other day, if he has any problems with constipation this could be increased to once per day (reminded to add fluid as well). Wfe reports pt gained some weight  and gave last months weight of 178lbs (has not weighed since). Pt continues to eat tbsps of food (not concerned about type, if he begins to eat more supplemental food, will discuss heart healthy choices). Pt with prostate cancer s/p radiation treatments (the last couple recently) --> to see doctor next month to see if in remission.  Pt wife reports that he was alwasys a napper and he has been doing well.   BM about every 2 days. Pt reports tolerating feeds well  pt reports having milkshakes  (couple of spoonfuls of ice cream, milk, ice) - this could help him increase calories for weight gain if pt continues to be able to tolerate small oral feedings. Pt was 165lbs now is weight stable between 170-172lbs (pt reports weight reported last month of 178lbs was incorrect). Pt wants me to speak to wife regarding protein, wife did not answer call (LM). Pt reports outside RD not following him, currently investigating; RD concerned no Dietitian managing or following tube feeds. Pt and pt wife report aside from this cardiac rehab RD, no Dietitian has been following them. telephone encounter 07/05/2018 internal medicine in [place] clinic refers to nutritionist and having nutrition to advise, unsure who they are referring to; reached out  08/06/2018 asked receptionist - she said she will inquire and have them call back - no response yet as of today 7/14. Reached out to [facility] Dr. Theodoro Kalata office regarding concerns spoke to [facility], she reports that she will reach out to their Dietitian and get the patient managed. Wife called 7/14 after phone call with [facility] and reiterated that a dietitian should be following them - wife expressed  concern and reiterates that aside from me no dietitian has been advising them- I told her I reached out to [facility] (Dr. Theodoro Kalata office) and Jefm Bryant clinic and that [facility] would reach out to them. As calculated before pt meeting needs, but could add a protein modulator to increase protein in order to help gain muscle; cannot change tube feed regimen as I am not Dietitian managing TF.    Expected Outcome  Short: Continue to on feeding tube.  Long: Continue with healthy diet.   Continue tolerating TF  ST: continue to tolerate TF LT: continue tube feeds Osmolite 1.5 (6 cartons/day) with 240 FW flushes per meal; 4 meals.  ST: continue to tolerate TF LT: gain weight and muscle       Nutrition Goals Discharge (Final Nutrition Goals Re-Evaluation): Nutrition Goals Re-Evaluation - 08/06/18 1041      Goals   Current Weight  170 lb 12.8 oz (77.5 kg)    Nutrition Goal  ST: continue to tolerate TF LT: continue tube feeds Osmolite 1.5 (6 cartons/day) with 240 FW flushes per meal; 4 meals. Gain weight (pt request to be about 180lbs) and muscle    Comment  pt reports having milkshakes  (couple of spoonfuls of ice cream, milk, ice) - this could help him increase calories for weight gain if pt continues to be able to tolerate small oral feedings. Pt was 165lbs now is weight stable between 170-172lbs (pt reports weight reported last month of 178lbs was incorrect). Pt wants me to speak to wife regarding protein, wife did not answer call (LM). Pt reports outside RD not following him, currently investigating; RD concerned no Dietitian  managing or following tube feeds. Pt and pt wife report aside from this cardiac rehab RD, no Dietitian has been following them. telephone encounter 07/05/2018 internal medicine in [place] clinic refers to nutritionist and having nutrition to advise, unsure who they are referring to; reached out 08/06/2018 asked receptionist - she said she will inquire and have them call back - no response yet as of today 7/14. Reached out to [facility] Dr. Theodoro Kalata office regarding concerns spoke to [facility], she reports that she will reach out to their Dietitian and get the patient managed. Wife called 7/14 after phone call with [facility] and reiterated that a dietitian should be following them - wife expressed concern and reiterates that aside from me no dietitian has been advising them- I told her I reached out to [facility] (Dr. Theodoro Kalata office) and [place] [facility] and that [facility] would reach out to them. As calculated before pt meeting needs, but could add a protein modulator to increase protein in order to help gain muscle; cannot change tube feed regimen as I am not Dietitian managing TF.    Expected Outcome  ST: continue to tolerate TF LT: gain weight and muscle       Psychosocial: Target Goals: Acknowledge presence or absence of significant depression and/or stress, maximize coping skills, provide positive support system. Participant is able to verbalize types and ability to use techniques and skills needed for reducing stress and depression.   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 He has  multiple medical issues to work through before then. He scored a 9 on the PHQ9 and states that he is not able to get to sleep  easily unless he uses the Xanax RX.       Family Dynamics   Good Support System?  Yes   Wife(Caregiver) and  his pastor     Barriers   Psychosocial barriers to participate in program  The patient should benefit from training in stress management and relaxation.;There are no identifiable barriers or psychosocial needs.      Screening Interventions   Interventions  Encouraged to exercise;To provide support and resources with identified psychosocial needs;Provide feedback about the scores to participant    Expected Outcomes  Short Term goal: Utilizing psychosocial counselor, staff and physician to assist with identification of specific Stressors or current issues interfering with healing process. Setting desired goal for each stressor or current issue identified.;Long Term Goal: Stressors or current issues are controlled or eliminated.;Short Term goal: Identification and review with participant of any Quality of Life or Depression concerns found by scoring the questionnaire.;Long Term goal: The participant improves quality of Life and PHQ9 Scores as seen by post scores and/or verbalization of changes       Quality of Life Scores:  Quality of Life - 08/08/18 1136      Quality of Life Scores   Health/Function Post  23.75 %    Socioeconomic Post  25.93 %    Psych/Spiritual Post  27.36 %    Family Post  30 %    GLOBAL Post  25.92 %      Scores of 19 and below usually indicate a poorer quality of life in these areas.  A difference of  2-3 points is a clinically meaningful difference.  A difference of 2-3 points in the total score of the Quality of Life Index has been associated with significant improvement in overall quality of life, self-image, physical symptoms, and general health in studies assessing change in quality of life.  PHQ-9: Recent Review Flowsheet Data    Depression screen [facility] 2/9 08/08/2018 04/03/2018 03/05/2018   Decreased Interest 2 0 3   Down, Depressed, Hopeless 0 0 1   PHQ -  2 Score 2 0 4   Altered sleeping 0 0 2   Tired, decreased energy _0 Change in appetite 0 0 0   Feeling bad or failure about yourself  0 0 0   Trouble concentrating 0 1 0   Moving slowly or fidgety/restless 0 1 0   Suicidal thoughts 0 0 0   PHQ-9 Score _1 Difficult doing work/chores Somewhat difficult Somewhat difficult Somewhat difficult     Interpretation of Total Score  Total Score Depression Severity:  1-4 = Minimal depression, 5-9 = Mild depression, 10-14 = Moderate depression, 15-19 = Moderately severe depression, 20-27 = Severe depression   Psychosocial Evaluation and Intervention: Psychosocial Evaluation - 03/22/18 1032      Psychosocial Evaluation & Interventions   Interventions  Stress management education;Encouraged to exercise with the program and follow exercise prescription    Comments  Counselor met with client who self-reported on 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  He observed differences in recovery ability from first to second procedure.  His primary support is his wife Shauna Hugh; their two daughters live in [place].  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  He spoke about  physical weakness since surgery.  He reports that his sleeping is better then it was but believes he averages 5 to 6 hours a night.  Appetite is an issue which is long-standing, has a stomach tube which has been in place for years.  Primary stressors include impact of surgery on physical abilities and difficulty communicating due to hearing issues. Counselor encouraged patience in recovery process.  Staff will follow.    Expected Outcomes  Short term goal: Focus on physical issues of building/increasing strength, stamina, and energy.  Long term goal: to be back on the golf course by May 2020    Continue Psychosocial Services   Follow up required by staff       Psychosocial Re-Evaluation: Psychosocial Re-Evaluation    Knapp Name 04/03/18 1015  04/30/18 1103 05/21/18 1236 06/11/18 1321       Psychosocial Re-Evaluation   Current issues with  Current Stress Concerns;Current Depression;Current Sleep Concerns  Current Stress Concerns;Current Depression;Current Sleep Concerns  Current Stress Concerns;Current Depression;Current Sleep Concerns  Current Stress Concerns;Current Depression;Current Sleep Concerns    Comments  Reviewed patient health questionnaire (PHQ-9) with patient for follow up. Previously, patients score indicated signs/symptoms of depression.  Reviewed to see if patient is improving symptom wise while in program.  Score improved and patient states that it is because they have been getting stronger and back to normal.  He continues to worry about his arm and his health recovery as he would rather be able to just get out and go.  He is sleeping better but still waking but now able to get back to sleep.   Waunita Schooner has been doing well at home.  He is eager to get back to his routine and finish rehab.  He does need to schedule another procedure for his cancer treatments but they were waiting until after rehab was over.  They will reach out to the oncologist to find out how they wish to procede since we are currently closed.  Last week, he reported to the ED after seeing that his tube was turning black.  He saw the surgeon at [facility] and cleaned out some debris thought to be from some of his pulls that were crushed.  Overall, he is doing well and feeling pretty good.  He would like to speed up his recovery as it feels like it is moving at a snails pace due to his lack of testosterone which brings down his energy levels.   He is trying to stay positive and appreciates our calls, emails, and videos.   Waunita Schooner continues to do well at home.  He is still not where he would like to be and would like to recover faster.  He has seen his oncologist and has a follow up this afternoon with plans to set [place] radiation treatments again. He will keep up posted on how these  are going.  Overall, he is good but not happy with his stamina.   He would be happier if he didn't have to take rest breaks.  I encouraged to just continue to do what he is doing especially during treatments.   Waunita Schooner has been doing well. His wife is making sure that he stays home and safe.  He has completed his radiation treatments.  He had three of them and will follow up in June.  He is still not satified with his lack of strength and we talked about adding in more protein.  He also found out that he has a melanoma  on his back that they are going to remove today with hopes of clear margins and caught earlier enough.  Overall, they are both doing well.     Expected Outcomes  Short: Continue to attend rehab regularly for regular exercise and social engagement. Long: Continue to improve symptoms and manage a positive mental state.  Short: Continue to exercise and talk to oncologist.  Long: Continue to stay positive and make progress in recovery.   Short: Continue to exercise.  Long: Continue to stay positive and work towards where he wants to be  Short: Continue to exercise. Long: Continue to practice positive self care.     Interventions  Stress management education;Encouraged to attend Cardiac Rehabilitation for the exercise  Stress management education;Encouraged to attend Cardiac Rehabilitation for the exercise  -  Stress management education;Encouraged to attend Cardiac Rehabilitation for the exercise    Continue Psychosocial Services   Follow up required by staff  Follow up required by staff  -  Follow up required by staff       Psychosocial Discharge (Final Psychosocial Re-Evaluation): Psychosocial Re-Evaluation - 06/11/18 1321      Psychosocial Re-Evaluation   Current issues with  Current Stress Concerns;Current Depression;Current Sleep Concerns    Comments  Waunita Schooner has been doing well. His wife is making sure that he stays home and safe.  He has completed his radiation treatments.  He had three of  them and will follow up in June.  He is still not satified with his lack of strength and we talked about adding in more protein.  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  Overall, they are both doing well.     Expected Outcomes  Short: Continue to exercise. Long: Continue to practice positive self care.     Interventions  Stress management education;Encouraged to attend Cardiac Rehabilitation for the exercise    Continue Psychosocial Services   Follow up required by staff       Vocational Rehabilitation: Provide vocational rehab assistance to qualifying candidates.   Vocational Rehab Evaluation & Intervention: Vocational Rehab - 03/05/18 1323      Initial Vocational Rehab Evaluation & Intervention   Assessment shows need for Vocational Rehabilitation  No       Education: Education Goals: Education classes will be provided on a variety of topics geared toward better understanding of heart health and risk factor modification. Participant will state understanding/return demonstration of topics presented as noted by education test scores.  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   Cardiac Rehab from 04/05/2018 in [facility] Cardiac and Pulmonary Rehab  Date  03/20/18  Educator  SB  Instruction Review Code  1- Verbalizes Understanding      General Nutrition Guidelines/Fats and Fiber: -Group instruction provided by verbal, written material, models and posters to present the general guidelines for heart  healthy nutrition. Gives an explanation and review of dietary fats and fiber.   Cardiac Rehab from 04/05/2018 in [facility] Cardiac and Pulmonary  Rehab  Date  04/03/18  Educator  [facility]  Instruction Review Code  1- Verbalizes Understanding      Controlling Sodium/Reading Food Labels: -Group verbal and written material supporting the discussion of sodium use in heart healthy nutrition. Review and explanation with models, verbal and written materials for utilization of the food label.   Cardiac Rehab from 04/05/2018 in [facility] Cardiac and Pulmonary Rehab  Date  04/05/18  Educator  [facility]  Instruction Review Code  1- Verbalizes Understanding      Exercise Physiology & General Exercise Guidelines: - Group verbal and written instruction with models to review the exercise physiology of the cardiovascular system and associated critical values. Provides general exercise guidelines with specific guidelines to those with heart or lung disease.    Aerobic Exercise & Resistance Training: - Gives group verbal and written instruction on the various components of exercise. Focuses on aerobic and resistive training programs and the benefits of this training and how to safely progress through these programs..   Flexibility, Balance, Mind/Body Relaxation: Provides group verbal/written instruction on the benefits of flexibility and balance training, including mind/body exercise modes such as yoga, pilates and tai chi.  Demonstration and skill practice provided.   Stress and Anxiety: - Provides group verbal and written instruction about the health risks of elevated stress and causes of high stress.  Discuss the correlation between heart/lung disease and anxiety and treatment options. Review healthy ways to manage with stress and anxiety.   Depression: - Provides group verbal and written instruction on the correlation between heart/lung disease and depressed mood, treatment options, and the stigmas associated with seeking  treatment.   Cardiac Rehab from 04/05/2018 in [facility] Cardiac and Pulmonary Rehab  Date  03/27/18  Educator  [facility]  Instruction Review Code  1- Verbalizes Understanding      Anatomy & Physiology of the Heart: - Group verbal and written instruction and models provide basic cardiac anatomy and physiology, with the coronary electrical and arterial systems. Review of Valvular disease and Heart Failure   Cardiac Procedures: - Group verbal and written instruction to review commonly prescribed medications for heart disease. Reviews the medication, class of the drug, and side effects. Includes the steps to properly store meds and maintain the prescription regimen. (beta blockers and nitrates)   Cardiac Medications I: - Group verbal and written instruction to review commonly prescribed medications for heart disease. Reviews the medication, class of the drug, and side effects. Includes the steps to properly store meds and maintain the prescription regimen.   Cardiac Medications II: -Group verbal and written instruction to review commonly prescribed medications for heart disease. Reviews the medication, class of the drug, and side effects. (all other drug classes)   Cardiac Rehab from 04/05/2018 in [facility] Cardiac and Pulmonary Rehab  Date  03/29/18  Educator  CE  Instruction Review Code  1- Verbalizes Understanding       Go Sex-Intimacy & Heart Disease, Get SMART - Goal Setting: - Group verbal and written instruction through game format to discuss heart disease and the return to sexual intimacy. Provides group verbal and written material to discuss and apply goal setting through the application of the S.M.A.R.T. Method.   Other Matters of the Heart: - Provides group verbal, written materials and models to describe Stable Angina and Peripheral Artery. Includes description of the disease process and treatment options available to the cardiac patient.   Exercise & Equipment Safety: - Individual verbal  instruction and demonstration of equipment use and  safety with use of the equipment.   Cardiac Rehab from 04/05/2018 in [facility] Cardiac and Pulmonary Rehab  Date  03/05/18  Educator  [facility]  Instruction Review Code  1- Verbalizes Understanding      Infection Prevention: - Provides verbal and written material to individual with discussion of infection control including proper hand washing and proper equipment cleaning during exercise session.   Cardiac Rehab from 04/05/2018 in [facility] Cardiac and Pulmonary Rehab  Date  03/05/18  Educator  [facility]  Instruction Review Code  1- Verbalizes Understanding      Falls Prevention: - Provides verbal and written material to individual with discussion of falls prevention and safety.   Cardiac Rehab from 04/05/2018 in [facility] Cardiac and Pulmonary Rehab  Date  03/05/18  Educator  [facility]  Instruction Review Code  1- Verbalizes Understanding      Diabetes: - Individual verbal and written instruction to review signs/symptoms of diabetes, desired ranges of glucose level fasting, after meals and with exercise. Acknowledge that pre and post exercise glucose checks will be done for 3 sessions at entry of program.   Cardiac Rehab from 04/05/2018 in [facility] Cardiac and Pulmonary Rehab  Date  03/05/18  Educator  SB  Instruction Review Code  1- Verbalizes Understanding      Know Your Numbers and Risk Factors: -Group verbal and written instruction about important numbers in your health.  Discussion of what are risk factors and how they play a role in the disease process.  Review of Cholesterol, Blood Pressure, Diabetes, and BMI and the role they play in your overall health.   Cardiac Rehab from 04/05/2018 in [facility] Cardiac and Pulmonary Rehab  Date  03/29/18  Educator  CE  Instruction Review Code  1- Verbalizes Understanding      Sleep Hygiene: -Provides group verbal and written instruction about how sleep can affect your health.  Define sleep hygiene, discuss sleep cycles and  impact of sleep habits. Review good sleep hygiene tips.    Other: -Provides group and verbal instruction on various topics (see comments)   Knowledge Questionnaire Score: Knowledge Questionnaire Score - 08/08/18 1136      Knowledge Questionnaire Score   Post Score  25/26       Core Components/Risk Factors/Patient Goals at Admission: Personal Goals and Risk Factors at Admission - 03/05/18 1323      Core Components/Risk Factors/Patient Goals on Admission    Weight Management  Weight Gain;Yes    Intervention  Weight Management: Develop a combined nutrition and exercise program designed to reach desired caloric intake, while maintaining appropriate intake of nutrient and fiber, sodium and fats, and appropriate energy expenditure required for the weight goal.;Weight Management: Provide education and appropriate resources to help participant work on and attain dietary goals.    Admit Weight  171 lb (77.6 kg)    Goal Weight: Short Term  173 lb (78.5 kg)    Goal Weight: Long Term  176 lb (79.8 kg)    Expected Outcomes  Short Term: Continue to assess and modify interventions until short term weight is achieved;Long Term: Adherence to nutrition and physical activity/exercise program aimed toward attainment of established weight goal   DAve and his wife will talk with RD that has prescribed the tube feedings to see how to increase caloric intake.    Diabetes  Yes   The tube feedings that Waunita Schooner has been using have increased his blood glucose levels.    Intervention  Provide education about signs/symptoms and action  to take for hypo/hyperglycemia.;Provide education about proper nutrition, including hydration, and aerobic/resistive exercise prescription along with prescribed medications to achieve blood glucose in normal ranges: Fasting glucose 65-99 mg/dL    Expected Outcomes  Short Term: Participant verbalizes understanding of the signs/symptoms and immediate care of hyper/hypoglycemia, proper foot  care and importance of medication, aerobic/resistive exercise and nutrition plan for blood glucose control.;Long Term: Attainment of HbA1C < 7%.    Lipids  Yes    Intervention  Provide education and support for participant on nutrition & aerobic/resistive exercise along with prescribed medications to achieve LDL <14m, HDL >450m    Expected Outcomes  Short Term: Participant states understanding of desired cholesterol values and is compliant with medications prescribed. Participant is following exercise prescription and nutrition guidelines.;Long Term: Cholesterol controlled with medications as prescribed, with individualized exercise RX and with personalized nutrition plan. Value goals: LDL < 701mHDL > 40 mg.       Core Components/Risk Factors/Patient Goals Review:  Goals and Risk Factor Review    Row Name 04/03/18 1022 04/30/18 1100 05/21/18 1244 06/11/18 1319       Core Components/Risk Factors/Patient Goals Review   Personal Goals Review  Weight Management/Obesity;Hypertension;Diabetes;Lipids  Weight Management/Obesity;Hypertension;Diabetes;Lipids  Weight Management/Obesity;Hypertension;Diabetes;Lipids  Weight Management/Obesity;Hypertension;Diabetes;Lipids    Review  DavWaunita Schooners been doing well in rehab. His weight has steady and he is not losing any more. He and his wife monitoring his medications and diet (including fluids) closely.  His pressures have been good at home and they check daily.  He is doing well with his blood sugars and he checks them at home 2-3x a week.  He is feeling well overall.  He does not enjoy his tube, but makes the best of it.   DavWaunita Schooners been doing well in rehab.  He is staying steady with his weight at home with his PEG tube.  They continue to watch what goes in very closely including his fluid intake.   His blood sugars continue to do well and he checks them daily.  His blood pressures have been between 110s/70s and 130s/70s.  He is pleased with his numbers but wishes  his recovery would speed up.   DavWaunita Schoonerntinues do well.  He would like to progress faster, but is doing what he can.  His weight and sugars continue to stay steady with his PEG tube.  His blood presssures have still been good.   He is going to be starting radiation again soon and I encouraged to continue to closely monitor all of these things.    Dave's weight has continued to be steady.  His sugars and pressures all continue to be good.  He has done well with radiation and will follow up in June.  He still wants to gain more strength and we talked about protein intake and will have Melissa help as well.     Expected Outcomes  Short: Continue to monitor weight closely to make sure he doesn't lose.  Long: Continue to monitor his risk factors.   Short: Continue to maintain weight.  Long: Continue to monitor risk factors.   Short: Continue to watch weight carefully.  Long: Continue to monitor risk factors.   Short: Continue to maintain weight.  Long: Continue to monitor risk factors.        Core Components/Risk Factors/Patient Goals at Discharge (Final Review):  Goals and Risk Factor Review - 06/11/18 1319      Core Components/Risk Factors/Patient Goals Review   Personal Goals  Review  Weight Management/Obesity;Hypertension;Diabetes;Lipids    Review  Dave's weight has continued to be steady.  His sugars and pressures all continue to be good.  He has done well with radiation and will follow up in June.  He still wants to gain more strength and we talked about protein intake and will have Melissa help as well.     Expected Outcomes  Short: Continue to maintain weight.  Long: Continue to monitor risk factors.        ITP Comments: ITP Comments    Row Name 03/05/18 1225 03/21/18 0612 04/11/18 1142 04/18/18 1218 08/07/18 0950   ITP Comments  Medical review completed today. ITP sent to Dr. Loleta Chance for review,changes as needed and signature. Documentation of diagnosis can be found in [place] 1/22  office visit  30 day review. Continue with ITP unless directed changes by Medical Director chart review. New to program.  Our program is currently closed due to COVID-19.  We are communicating with patient via phone calls and emails.    30 day review. Continue with ITP unless directed changes by Medical Director chart review.  RD concerned no Dietitian managing or following tube feeds. Pt and pt wife report aside from this cardiac rehab RD, no Dietitian has been following them. telephone encounter 07/05/2018 internal medicine in [place] clinic refers to nutritionist and having nutrition to advise, unsure who they are referring to; reached out 08/06/2018 asked receptionist - she said she will inquire and have them call back - no response yet as of today 7/14. Reached out to [facility] Dr. Theodoro Kalata office regarding concerns spoke to [facility], she reports that she will reach out to their Dietitian and get the patient managed. Wife called 7/14 after phone call with [facility] and reiterated that a dietitian should be following them - wife expressed concern and reiterates that aside from me no dietitian has been advising them- I told her I reached out to [facility] (Dr. Theodoro Kalata office) and [place] [facility] and that [facility] would reach out to them.   [place] Name 08/08/18 1415 08/09/18 1028         ITP Comments  30 day review cycle restarting  after being closed since March 16 because of  Covid 19 pandemic. Program opened to patients on July 6. Not all have returned. ITP updated and sent to Medical Director for review,changes as needed and signature  Discharge ITP sent and signed by Dr. Sabra Heck.  Discharge Summary routed to PCP and cardiologist.         Comments: Discharge ITP

## 2018-08-09 NOTE — Progress Notes (Signed)
Daily Session Note  Patient Details  Name: Jermaine Campbell MRN: [id number] Date of Birth: [birth year] Referring Provider:     Cardiac Rehab from 03/05/2018 in [facility]  Referring Provider  Dwaine Deter MD      Encounter Date: 08/09/2018  Check In: Session Check In - 08/09/18 1027      Check-In   Supervising physician immediately available to respond to emergencies  See telemetry face sheet for immediately available ER MD    Location  [facility]-Cardiac [facility] Pulmonary Rehab    Staff Present  Jasper Loser BS, Exercise Physiologist;[name] Harcum Garland, MA, RCEP, CCRP, CCET;Susanne Bice, RN, BSN, CCRP    Virtual Visit  No    Medication changes reported      No    Warm-up and Cool-down  Performed on first and last piece of equipment    Resistance Training Performed  Yes    VAD Patient?  No    PAD/SET Patient?  No      Pain Assessment   Currently in Pain?  No/denies          Social History   Tobacco Use  Smoking Status Never Smoker  Smokeless Tobacco Never Used    Goals Met:  Independence with exercise equipment Exercise tolerated well Personal goals reviewed No report of cardiac concerns or symptoms Strength training completed today  Goals Unmet:  Not Applicable  Comments:  Hebert graduated today from  rehab with 18 sessions completed.  Details of the patient's exercise prescription and what He needs to do in order to continue the prescription and progress were discussed with patient.  Patient was given a copy of prescription and goals.  Patient verbalized understanding.  Jaqualin plans to continue to exercise by walking at home.    Dr. Emily Filbert is Medical Director for [place] and [facility].

## 2018-09-13 ENCOUNTER — Telehealth: Payer: Self-pay | Admitting: Urology

## 2018-09-13 NOTE — Telephone Encounter (Signed)
Pt's wife called and states that his urine is cloudy and looks like it has mucus in it. She wants to know if he should come in and leave a UA or make an appt to see Dr.  Please advise.

## 2018-09-13 NOTE — Telephone Encounter (Signed)
He should come in for a visit with Sam.

## 2018-09-14 ENCOUNTER — Encounter: Payer: Self-pay | Admitting: Physician Assistant

## 2018-09-14 ENCOUNTER — Other Ambulatory Visit: Payer: Self-pay

## 2018-09-14 ENCOUNTER — Ambulatory Visit (INDEPENDENT_AMBULATORY_CARE_PROVIDER_SITE_OTHER): Payer: Medicare Other | Admitting: Physician Assistant

## 2018-09-14 VITALS — BP 115/74 | HR 73 | Ht 69.0 in | Wt 169.0 lb

## 2018-09-14 DIAGNOSIS — R82998 Other abnormal findings in urine: Secondary | ICD-10-CM

## 2018-09-14 DIAGNOSIS — C61 Malignant neoplasm of prostate: Secondary | ICD-10-CM

## 2018-09-14 DIAGNOSIS — R310 Gross hematuria: Secondary | ICD-10-CM

## 2018-09-14 DIAGNOSIS — R001 Bradycardia, unspecified: Secondary | ICD-10-CM | POA: Insufficient documentation

## 2018-09-14 LAB — URINALYSIS, COMPLETE
Bilirubin, UA: NEGATIVE
Glucose, UA: NEGATIVE
Ketones, UA: NEGATIVE
Leukocytes,UA: NEGATIVE
Nitrite, UA: NEGATIVE
Specific Gravity, UA: 1.015 (ref 1.005–1.030)
Urobilinogen, Ur: 2 mg/dL — ABNORMAL HIGH (ref 0.2–1.0)
pH, UA: 8.5 — ABNORMAL HIGH (ref 5.0–7.5)

## 2018-09-14 LAB — MICROSCOPIC EXAMINATION
Bacteria, UA: NONE SEEN
RBC: >30 /hpf — AB (ref 0–2)

## 2018-09-14 NOTE — Progress Notes (Signed)
09/14/2018 8:16 AM   Jermaine Campbell Jun 10, 1945 [id number]  CC: Cloudy, dark urine with mucus  HPI: Jermaine Campbell is a 73 y.o. male who presents today for evaluation of possible UTI. He is an established [facility] patient who last saw Dr. Erlene Quan on 08/23/2017 for HOLEP with PMH prostate cancer, BPH with BOO, and urinary retention.  He also has a history of throat cancer approximately 15 years ago.  He reports a 2-day history of cloudy, dark urine with mucus. He denies dysuria, frequency, urgency, and flank pain.  He does not have a history of recurrent UTI.  On chart review, he has had microscopic hematuria on at least 4 occasions in 2019.  CT abdomen pelvis with contrast on 07/03/2017 following prostate cancer diagnosis revealed bilateral renal cortical thinning with punctate lower pole left renal stone, bilateral hydroureteronephrosis, bilateral delayed contrast excretion, and moderately distended bladder with wall irregularity and left-sided bladder diverticulum.  These findings were believed to be similar to what had been previously visualized per MRI on 05/16/2017.  He is on anticoagulation (Xarelto). His most recent creatinine, taken at [facility] on 07/04/2018, is 0.9; His creatinine has been downtrending since he underwent HOLEP. He is a never smoker.  In-office UA today positive for 3+ blood, 2+ protein, 2.0 EU/dL urobilinogen; urine microscopy with >30 RBCs/HPF.  PMH: Past Medical History:  Diagnosis Date  . Anemia   . Bradycardia   . 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 Cancer of prostate ([place]) 2019  . Coronary artery disease    atherosclerosis of native coronary artery of native heart without angina pectoris  . Deaf    Left ear  . Dysphagia    history of d/t throat cancer  . Dysrhythmia    paroxysmal atrial fibrillation, bradycardia  . H/O syncope    several yrs ago  . History of BPH   .  Hypercholesteremia   . Hypertension   . Hypothyroidism    s/p radiation tx - throat CA  . Myocardial infarction ([place])    1996  . Neck stiffness    s/p radiation tx - Throat CA  . Seizures ([place])    last one 2 yrs ago. not medicated. d/t injury to frontal lobe  . Vertigo    no episodes 4-5 yrs/dizziness  . Wears hearing aid    Right ear/ deaf in left ear    Surgical History: Past Surgical History:  Procedure Laterality Date  . BICEPT TENODESIS Right 01/26/2016   Procedure: BICEPS TENODESIS- open;  Surgeon: Corky Mull, MD;  Location: [facility] ORS;  Service: Orthopedics;  Laterality: Right;  . CARDIAC CATHETERIZATION     before CABG/ DR PARACHOS IS CARDIOLOGIST  . CATARACT EXTRACTION W/PHACO Left 07/30/2014   Procedure: CATARACT EXTRACTION PHACO AND INTRAOCULAR LENS PLACEMENT ([place]) SHUGARCAINE;  Surgeon: Leandrew Koyanagi, MD;  Location: [facility];  Service: Ophthalmology;  Laterality: Left;  SHUGARCAINE  . CATARACT EXTRACTION W/PHACO Right 10/29/2014   Procedure: CATARACT EXTRACTION PHACO AND INTRAOCULAR LENS PLACEMENT ([facility]);  Surgeon: Leandrew Koyanagi, MD;  Location: [facility];  Service: Ophthalmology;  Laterality: Right;  [place]  . COLONOSCOPY    . COLONOSCOPY WITH PROPOFOL N/A 04/14/2017   Procedure: COLONOSCOPY WITH PROPOFOL;  Surgeon: Manya Silvas, MD;  Location: [facility] ENDOSCOPY;  Service: Endoscopy;  Laterality: N/A;  . CORONARY ARTERY BYPASS GRAFT  1996   3 vessel  . EYE SURGERY Bilateral  2015   cataract extractions  . FRACTURE SURGERY     right arm. as a child  . HOLEP-LASER ENUCLEATION OF THE PROSTATE WITH MORCELLATION N/A 08/23/2017   Procedure: HOLEP-LASER ENUCLEATION OF THE PROSTATE WITH MORCELLATION;  Surgeon: Hollice Espy, MD;  Location: [facility] OR[facility];  Service: Urology;  Laterality: N/A;  . OPEN SUBSCAPULARIS REPAIR Right 01/26/2016   Procedure: arthroscopic  SUBSCAPULARIS REPAIR;  Surgeon: Corky Mull, MD;  Location: [facility];  Service:  Orthopedics;  Laterality: Right;  . SHOULDER ARTHROSCOPY WITH OPEN ROTATOR CUFF REPAIR Right 01/26/2016   Procedure: SHOULDER ARTHROSCOPY WITH MINI OPEN ROTATOR CUFF REPAIR;  Surgeon: Corky Mull, MD;  Location: [facility];  Service: Orthopedics;  Laterality: Right;  . SHOULDER ARTHROSCOPY WITH SUBACROMIAL DECOMPRESSION Right 01/26/2016   Procedure: SHOULDER ARTHROSCOPY WITH SUBACROMIAL DECOMPRESSION and debridement;  Surgeon: Corky Mull, MD;  Location: [facility] OR[facility];  Service: Orthopedics;  Laterality: Right;  . THROAT SURGERY     excision - cancer  . TONSILLECTOMY      Home Medications:  Allergies as of 09/14/2018      Reactions   Tamsulosin Hcl Other (See Comments)   Makes blood pressure drop low and pass out      Medication List       Accurate as of September 14, 2018 11:59 PM. If you have any questions, ask your nurse or doctor.        ALPRAZolam 0.5 MG tablet Commonly known as: XANAX Place 0.25 mg into feeding tube at bedtime as needed for sleep.   atorvastatin 80 MG tablet Commonly known as: LIPITOR Place 80 mg into feeding tube daily.   bicalutamide 50 MG tablet Commonly known as: CASODEX Take 50 mg by mouth daily.   docusate sodium 100 MG capsule Commonly known as: COLACE Take 1 capsule (100 mg total) by mouth 2 (two) times daily.   fluticasone 50 MCG/ACT nasal spray Commonly known as: FLONASE Place 1 spray into both nostrils as needed.   HYDROcodone-acetaminophen 5-325 MG tablet Commonly known as: NORCO/VICODIN Take 1-2 tablets by mouth every 6 (six) hours as needed for moderate pain.   ibuprofen 200 MG tablet Commonly known as: ADVIL Take 200 mg by mouth every 6 (six) hours as needed.   isosorbide dinitrate 10 MG tablet Commonly known as: ISORDIL Place 0.5 mg into feeding tube 2 (two) times daily.   leuprolide 1 MG/0.2ML injection Commonly known as: LUPRON Inject into the skin every 3 (three) months.   levothyroxine 100 MCG tablet Commonly known as:  SYNTHROID Take 100 mcg by mouth daily before breakfast.   metFORMIN 500 MG tablet Commonly known as: GLUCOPHAGE Place 500 mg into feeding tube 2 (two) times daily with a meal.   metoprolol tartrate 25 MG tablet Commonly known as: LOPRESSOR Take 12.5 mg by mouth 2 (two) times daily.   ondansetron 4 MG tablet Commonly known as: ZOFRAN Place 4 mg into feeding tube every 8 (eight) hours as needed for nausea or vomiting.   polyethylene glycol 17 g packet Commonly known as: MIRALAX / GLYCOLAX Take 17 g by mouth daily.   simvastatin 40 MG tablet Commonly known as: ZOCOR Take 40 mg by mouth daily.   tamsulosin 0.4 MG Caps capsule Commonly known as: FLOMAX Take 1 capsule (0.4 mg total) by mouth daily.   vitamin B-12 1000 MCG tablet Commonly known as: CYANOCOBALAMIN Take 1,000 mcg by mouth daily.   Xarelto 10 MG Tabs tablet Generic drug: rivaroxaban Take 10 mg by mouth every  evening.       Allergies:  Allergies  Allergen Reactions  . Tamsulosin Hcl Other (See Comments)    Makes blood pressure drop low and pass out    Family History: Family History  Problem Relation Age of Onset  . Heart attack Father     Social History:   reports that he has never smoked. He has never used smokeless tobacco. He reports that he does not drink alcohol or use drugs.  ROS: UROLOGY Frequent Urination?: No Hard to postpone urination?: No Burning/pain with urination?: No Get up at night to urinate?: No Leakage of urine?: No Urine stream starts and stops?: No Trouble starting stream?: No Do you have to strain to urinate?: No Blood in urine?: No Urinary tract infection?: No Sexually transmitted disease?: No Injury to kidneys or bladder?: No Painful intercourse?: No Weak stream?: No Erection problems?: No Penile pain?: No  Gastrointestinal Nausea?: No Vomiting?: No Indigestion/heartburn?: No Diarrhea?: No Constipation?: No  Constitutional Fever: No Night sweats?: No  Weight loss?: No Fatigue?: No  Skin Skin rash/lesions?: No Itching?: No  Eyes Blurred vision?: No Double vision?: No  Ears/Nose/Throat Sore throat?: No Sinus problems?: No  Hematologic/Lymphatic Swollen glands?: No Easy bruising?: No  Cardiovascular Leg swelling?: No Chest pain?: No  Respiratory Cough?: No Shortness of breath?: No  Endocrine Excessive thirst?: No  Musculoskeletal Back pain?: No Joint pain?: No  Neurological Headaches?: No Dizziness?: No  Psychologic Depression?: No Anxiety?: No  Physical Exam: BP 115/74 (BP Location: Left Arm, Patient Position: Sitting, Cuff Size: Normal)   Pulse 73   Ht 5\' 9"  (1.753 m)   Wt 169 lb (76.7 kg)   BMI 24.96 kg/m   Constitutional:  Alert and oriented, no acute distress, nontoxic appearing HEENT: [place], AT, hard of hearing L<R with hearing aid in place on R Cardiovascular: No clubbing, cyanosis, or edema Respiratory: Normal respiratory effort, no increased work of breathing Skin: No rashes, bruises or suspicious lesions Neurologic: Grossly intact, no focal deficits, moving all 4 extremities Psychiatric: Normal mood and affect  Laboratory Data: Lab Results  Component Value Date   WBC 4.5 08/22/2017   HGB 10.0 (L) 08/22/2017   HCT 30.1 (L) 08/22/2017   MCV 85.5 08/22/2017   PLT 114 (L) 08/22/2017    Lab Results  Component Value Date   CREATININE 1.24 08/22/2017    Results for orders placed or performed in visit on 09/14/18  CULTURE, URINE COMPREHENSIVE   Specimen: Urine   UR  Result Value Ref Range   Urine Culture, Comprehensive Final report    Organism ID, Bacteria Comment   Microscopic Examination   URINE  Result Value Ref Range   WBC, UA 0-5 0 - 5 /hpf   RBC >30 (A) 0 - 2 /hpf   Epithelial Cells (non renal) 0-10 0 - 10 /hpf   Renal Epithel, UA 0-10 (A) None seen /hpf   Bacteria, UA None seen None seen/Few  Urinalysis, Complete  Result Value Ref Range   Specific Gravity, UA 1.015 1.005  - 1.030   pH, UA 8.5 (H) 5.0 - 7.5   Color, UA Brown (A) Yellow   Appearance Ur Cloudy (A) Clear   Leukocytes,UA Negative Negative   Protein,UA 2+ (A) Negative/Trace   Glucose, UA Negative Negative   Ketones, UA Negative Negative   RBC, UA 3+ (A) Negative   Bilirubin, UA Negative Negative   Urobilinogen, Ur 2.0 (H) 0.2 - 1.0 mg/dL   Nitrite, UA Negative Negative  Microscopic Examination See below:    Pertinent Imaging: CT abdomen pelvic w/ contrast, 07/03/2017: CLINICAL DATA:  Irregular bowel habits. Left lower quadrant and right-sided pain. History of throat cancer 14 years ago with chemotherapy and radiation therapy. Prostate biopsy last Monday with new diagnosis of prostate cancer.  EXAM: CT ABDOMEN AND PELVIS WITH CONTRAST  TECHNIQUE: Multidetector CT imaging of the abdomen and pelvis was performed using the standard protocol following bolus administration of intravenous contrast.  CONTRAST:  1mL OMNIPAQUE IOHEXOL 300 MG/ML  SOLN  COMPARISON:  Prostate MRI 05/16/2017  FINDINGS: Lower chest: Minimal motion degradation at the lung bases. Prior median sternotomy. Tiny hiatal hernia.  Hepatobiliary: Normal liver. Normal gallbladder, without biliary ductal dilatation.  Pancreas: The pancreas is mildly heterogeneous in density. Favored to be related to cystic foci within, including within the body at 1.8 cm on image 24/2. No convincing evidence of peripancreatic edema. No main pancreatic duct dilatation.  Spleen: Normal in size, without focal abnormality.  Adrenals/Urinary Tract: Normal adrenal glands. Right greater than left renal cortical thinning. Punctate lower pole left renal collecting system stone. Moderate right and mild to moderate left-sided hydroureteronephrosis. Delayed contrast excretion indicative of decreased renal function. This is worse on the right. Bilateral hydroureter, followed to the level of the urinary bladder. The bladder is  moderately distended with wall irregularity and a prominent left-sided bladder diverticulum. This is relatively similar to 05/16/2017 MRI.  Stomach/Bowel: Minimal motion degradation continuing into the upper abdomen. Normal remainder of the stomach. Normal colon and terminal ileum. Normal small bowel caliber.  Vascular/Lymphatic: Advanced aortic and branch vessel atherosclerosis. Heterogeneous soft tissue and fluid density is identified within the small bowel mesentery, including on image 36/2. No pelvic sidewall adenopathy.  Reproductive: The prostate is enlarged and heterogeneous, with impression into the urinary bladder. This is better evaluated on MRI.  Other: No significant free fluid.  Musculoskeletal: Degenerate disc disease at the lumbosacral junction.  IMPRESSION: 1. Motion degraded exam, especially of the lung bases and upper abdomen. 2. Bladder outlet obstruction and bladder distension, similar to prior MRI. This results in right worse than left urinary tract obstruction, decreased renal function. 3. Left nephrolithiasis. 4. Heterogeneous appearance of the pancreas. Most likely related to 1 or more underlying cystic lesions. Suboptimally evaluated. Consider dedicated nonemergent pre and post contrast abdominal MRI/MRCP. Also consider exclusion of acute pancreatitis via laboratory analysis. 5. Soft tissue and/or fluid density at the root of the small bowel mesentery. Indeterminate. In the setting of renal insufficiency and possible pancreatitis, this could simply represent edema. Alternatively, reactive or less likely metastatic adenopathy could have this appearance. Recommend attention on follow-up. 6.  Aortic Atherosclerosis (ICD10-I70.0).   Electronically Signed   By: Abigail Miyamoto M.D.   On: 07/03/2017 14:20  I personally reviewed the images referenced above and agree with the radiologic findings.  Assessment & Plan:   1. Gross hematuria Patient  with new onset gross hematuria and a history of at least 4 episodes of microscopic hematuria in 2019.  No irritative voiding symptoms today.  Nonspecific UA.  I will send urine for culture, however I do not believe he has a UTI.  Given his history of multiple malignancies, I believe it would be prudent to proceed with a hematuria work-up at this time.  I explained to the patient that there are multiple possible etiologies of hematuria.  He is known to be on anticoagulation and there was a renal stone visualized on his CT abdomen pelvis in 2019.  Further imaging required to rule out other causes.  If his urine culture is positive for infection, I will have him repeat UA following treatment.  At that point I may decide to defer CT urogram.  I believe he should undergo cystoscopy regardless. - Urinalysis, Complete - Urine culture - Cystoscopy - CT urogram  2. BPH with BOO and prostate cancer, s/p HOLEP Patient has not followed up with Dr. Bernardo Heater since undergoing surgery.  He had a follow-up scheduled for December 2019, however he was hospitalized at that time and had to cancel.  I will reschedule him for an annual follow-up visit today.  Ideally, I would like him to undergo cystoscopy and receive his CTU results at that visit as well. -Annual follow-up with Dr. Clance Boll, PA-C  [facility] [address], [place], [place] [address] [phone]

## 2018-09-16 LAB — CULTURE, URINE COMPREHENSIVE

## 2018-09-25 ENCOUNTER — Ambulatory Visit
Admission: RE | Admit: 2018-09-25 | Discharge: 2018-09-25 | Disposition: A | Discharge status: Home / Self Care | Payer: Medicare Other | Source: Ambulatory Visit | Attending: Physician Assistant | Admitting: Physician Assistant

## 2018-09-25 ENCOUNTER — Other Ambulatory Visit: Payer: Self-pay

## 2018-09-25 DIAGNOSIS — R310 Gross hematuria: Secondary | ICD-10-CM | POA: Insufficient documentation

## 2018-09-25 LAB — POCT I-STAT CREATININE: Creatinine, Ser: 0.8 mg/dL (ref 0.61–1.24)

## 2018-09-25 MED ORDER — IOHEXOL 300 MG/ML  SOLN
100.0000 mL | Freq: Once | INTRAMUSCULAR | Status: AC | PRN
  Administered 2018-09-25: 100 mL via INTRAVENOUS

## 2018-09-28 ENCOUNTER — Telehealth: Payer: Self-pay | Admitting: Urology

## 2018-09-28 NOTE — Telephone Encounter (Signed)
Please advise on CT results.

## 2018-09-28 NOTE — Telephone Encounter (Signed)
Pt's wife, Shauna Hugh called and wants to know if she can get CT results before 10/1 at visit w/[facility].

## 2018-09-28 NOTE — Telephone Encounter (Signed)
CT showed no bladder distention or hydronephrosis.  Are small, nonobstructing renal calculi.  There was a small calculus in the distal ureter without obstruction.

## 2018-10-02 NOTE — Telephone Encounter (Signed)
LMOM for patient to return call.

## 2018-10-02 NOTE — Telephone Encounter (Signed)
Patient and his wife called back.  I read the message from Dr. Bernardo Heater regarding the CT scan results.    They will be here on Friday for the cystoscopy.

## 2018-10-02 NOTE — Telephone Encounter (Signed)
Patient's wife called back but there was not a DPR on file and I explained that we needed to speak with the patient and ne would need to sign a DPR befor we could speak with her about results.   [name]

## 2018-10-05 ENCOUNTER — Other Ambulatory Visit: Payer: Medicare Other | Admitting: Urology

## 2018-10-05 ENCOUNTER — Encounter: Payer: Self-pay | Admitting: Urology

## 2018-10-05 ENCOUNTER — Other Ambulatory Visit: Payer: Self-pay

## 2018-10-05 ENCOUNTER — Ambulatory Visit (INDEPENDENT_AMBULATORY_CARE_PROVIDER_SITE_OTHER): Payer: Medicare Other | Admitting: Urology

## 2018-10-05 VITALS — BP 120/78 | HR 64 | Ht 69.0 in | Wt 169.0 lb

## 2018-10-05 DIAGNOSIS — R310 Gross hematuria: Secondary | ICD-10-CM

## 2018-10-05 LAB — URINALYSIS, COMPLETE
Bilirubin, UA: NEGATIVE
Glucose, UA: NEGATIVE
Ketones, UA: NEGATIVE
Leukocytes,UA: NEGATIVE
Nitrite, UA: NEGATIVE
Specific Gravity, UA: 1.015 (ref 1.005–1.030)
Urobilinogen, Ur: 2 mg/dL — ABNORMAL HIGH (ref 0.2–1.0)
pH, UA: 8 — ABNORMAL HIGH (ref 5.0–7.5)

## 2018-10-05 LAB — MICROSCOPIC EXAMINATION
Bacteria, UA: NONE SEEN
RBC, Urine: >30 /hpf — AB (ref 0–2)

## 2018-10-05 MED ORDER — LIDOCAINE HCL URETHRAL/MUCOSAL 2 % EX GEL
1.0000 "application " | Freq: Once | CUTANEOUS | Status: AC
  Administered 2018-10-05: 1 via URETHRAL

## 2018-10-05 NOTE — Progress Notes (Signed)
   10/05/18  CC:  Chief Complaint  Patient presents with  . Cysto   Indications: Hematuria  HPI: Refer to [facility]'s note of 09/14/2018.  Urine culture was negative.  CTU showed bilateral punctate renal calculi.  A small 2-3 mm calculus was identified in the left distal ureter without hydronephrosis.  There were no vitals taken for this visit. NED. A&Ox3.   No respiratory distress   Abd soft, NT, ND Normal phallus with bilateral descended testicles  Cystoscopy Procedure Note  Patient identification was confirmed, informed consent was obtained, and patient was prepped using Betadine solution.  Lidocaine jelly was administered per urethral meatus.     Pre-Procedure: - Inspection reveals a normal caliber urethral meatus.  Procedure: The flexible cystoscope was introduced without difficulty - No urethral strictures/lesions are present. - Prostatic fossa open  - Open bladder neck - Bilateral ureteral orifices identified - Bladder mucosa  reveals no ulcers, tumors, or lesions - No bladder stones -Moderate trabeculation  Retroflexion shows no abnormalities   Post-Procedure: - Patient tolerated the procedure well  Assessment/ Plan: No significant abnormalities on cystoscopy.  He had a small distal ureteral calculus without obstruction.  He is asymptomatic.  This calculus should most likely passed if it has not already.  Status post IMRT for prostate cancer at [facility].  PSA earlier this month was 0.21.  ADT was discontinued secondary to MI  Follow-up 1 year with [facility].  Instructed to call earlier for recurrent hematuria or development of flank pain.   Abbie Sons, MD

## 2018-10-06 ENCOUNTER — Encounter: Payer: Self-pay | Admitting: Urology

## 2018-10-12 ENCOUNTER — Other Ambulatory Visit: Payer: Medicare Other | Admitting: Physician Assistant

## 2018-10-25 ENCOUNTER — Other Ambulatory Visit: Payer: Medicare Other | Admitting: Urology

## 2019-02-14 ENCOUNTER — Ambulatory Visit: Payer: Medicare Other | Attending: Internal Medicine

## 2019-02-14 DIAGNOSIS — Z23 Encounter for immunization: Secondary | ICD-10-CM | POA: Insufficient documentation

## 2019-02-14 NOTE — Progress Notes (Signed)
   Covid-19 Vaccination Clinic  Name:  Jermaine Campbell    MRN: [id number] DOB: 1945-10-29  02/14/2019  Mr. Riniker was observed post Covid-19 immunization for 15 minutes without incidence. He was provided with Vaccine Information Sheet and instruction to access the V-Safe system.   Mr. Bente was instructed to call 911 with any severe reactions post vaccine: Marland Kitchen Difficulty breathing  . Swelling of your face and throat  . A fast heartbeat  . A bad rash all over your body  . Dizziness and weakness    Immunizations Administered    Name Date Dose VIS Date Route   Pfizer COVID-19 Vaccine 02/14/2019 10:40 AM 0.3 mL 01/04/2019 Intramuscular   Manufacturer: [place]   Lot: [id number]   [place]: [id number]

## 2019-03-06 ENCOUNTER — Ambulatory Visit: Payer: Medicare Other | Attending: Internal Medicine

## 2019-03-06 DIAGNOSIS — Z23 Encounter for immunization: Secondary | ICD-10-CM

## 2019-03-06 NOTE — Progress Notes (Signed)
   Covid-19 Vaccination Clinic  Name:  Jermaine Campbell    MRN: [id number] DOB: 11/29/1945  03/06/2019  Jermaine Campbell was observed post Covid-19 immunization for 15 minutes without incidence. He was provided with Vaccine Information Sheet and instruction to access the V-Safe system.   Jermaine Campbell was instructed to call 911 with any severe reactions post vaccine: Marland Kitchen Difficulty breathing  . Swelling of your face and throat  . A fast heartbeat  . A bad rash all over your body  . Dizziness and weakness    Immunizations Administered    Name Date Dose VIS Date Route   Pfizer COVID-19 Vaccine 03/06/2019  3:48 PM 0.3 mL 01/04/2019 Intramuscular   Manufacturer: [place]   Lot: [id number]   [place]: [id number]

## 2019-04-09 ENCOUNTER — Other Ambulatory Visit: Payer: Self-pay | Admitting: Neurology

## 2019-04-09 DIAGNOSIS — R569 Unspecified convulsions: Secondary | ICD-10-CM

## 2019-04-12 ENCOUNTER — Ambulatory Visit
Admission: RE | Admit: 2019-04-12 | Discharge: 2019-04-12 | Disposition: A | Discharge status: Home / Self Care | Payer: Medicare Other | Source: Ambulatory Visit | Attending: Neurology | Admitting: Neurology

## 2019-04-12 ENCOUNTER — Other Ambulatory Visit: Payer: Self-pay

## 2019-04-12 DIAGNOSIS — R569 Unspecified convulsions: Secondary | ICD-10-CM | POA: Diagnosis present

## 2019-04-12 MED ORDER — GADOBUTROL 1 MMOL/ML IV SOLN
7.0000 mL | Freq: Once | INTRAVENOUS | Status: AC | PRN
  Administered 2019-04-12: 7 mL via INTRAVENOUS

## 2019-06-25 ENCOUNTER — Other Ambulatory Visit: Payer: Self-pay

## 2019-06-25 ENCOUNTER — Encounter: Payer: Self-pay | Admitting: Dermatology

## 2019-06-25 ENCOUNTER — Ambulatory Visit (INDEPENDENT_AMBULATORY_CARE_PROVIDER_SITE_OTHER): Payer: Medicare Other | Admitting: Dermatology

## 2019-06-25 DIAGNOSIS — L918 Other hypertrophic disorders of the skin: Secondary | ICD-10-CM

## 2019-06-25 DIAGNOSIS — W57XXXA Bitten or stung by nonvenomous insect and other nonvenomous arthropods, initial encounter: Secondary | ICD-10-CM

## 2019-06-25 DIAGNOSIS — L905 Scar conditions and fibrosis of skin: Secondary | ICD-10-CM

## 2019-06-25 DIAGNOSIS — Z86006 Personal history of melanoma in-situ: Secondary | ICD-10-CM

## 2019-06-25 DIAGNOSIS — L821 Other seborrheic keratosis: Secondary | ICD-10-CM

## 2019-06-25 DIAGNOSIS — S30861A Insect bite (nonvenomous) of abdominal wall, initial encounter: Secondary | ICD-10-CM

## 2019-06-25 DIAGNOSIS — D489 Neoplasm of uncertain behavior, unspecified: Secondary | ICD-10-CM

## 2019-06-25 DIAGNOSIS — L578 Other skin changes due to chronic exposure to nonionizing radiation: Secondary | ICD-10-CM

## 2019-06-25 DIAGNOSIS — L814 Other melanin hyperpigmentation: Secondary | ICD-10-CM

## 2019-06-25 DIAGNOSIS — L570 Actinic keratosis: Secondary | ICD-10-CM | POA: Diagnosis not present

## 2019-06-25 DIAGNOSIS — D229 Melanocytic nevi, unspecified: Secondary | ICD-10-CM

## 2019-06-25 NOTE — Progress Notes (Signed)
Follow-Up Visit   Subjective  Jermaine Campbell is a 74 y.o. male who presents for the following: Follow-up (has some spots on his scalp and left shin for past year or so,  he would like to have looked at, and a spot on his back that he noticed a week ago. Patient would also like to have his MMis site looked at posterior neck).  Pt picks at spots on scalp and face.  Get sore and irritated.   The following portions of the chart were reviewed this encounter and updated as appropriate:      Review of Systems:  No other skin or systemic complaints except as noted in HPI or Assessment and Plan.  Objective  Well appearing patient in no apparent distress; mood and affect are within normal limits.  All skin waist up examined.  Objective  Right preauricular x 3,  Right Malar cheek x 1 and right temple x 1, Vertex scalp and pariteal scalp x 8 (13): Erythematous thin papules/macules with gritty scale.   Objective  Neck - Posterior: Well healed scar with no evidence of recurrence   Objective  Right Flank: Pink crusted papule- just noticed few days ago, itches  Objective  Left Lower Leg - Anterior: 1.5 cm pink scaly plaque, present at least a year per patient       Objective  Neck - Posterior: Pink white smooth patch  anterior edge with 7 mm gray-brown slightly depressed macule.  Patient has had radiation of neck in past  Images    Objective  Legs: Stuck-on, waxy, tan-brown papules and plaques -- Discussed benign etiology and prognosis.    Assessment & Plan  AK (actinic keratosis) (13) Right preauricular x 3,  Right Malar cheek x 1 and right temple x 1, Vertex scalp and pariteal scalp x 8  HyAk vs ISK  Cryotherapy today Prior to procedure, discussed risks of blister formation, small wound, skin dyspigmentation, or rare scar following cryotherapy.    Destruction of lesion - Right preauricular x 3,  Right Malar cheek x 1 and right temple x 1, Vertex scalp and pariteal  scalp x 8  Destruction method: cryotherapy   Informed consent: discussed and consent obtained   Lesion destroyed using liquid nitrogen: Yes   Region frozen until ice ball extended beyond lesion: Yes   Outcome: patient tolerated procedure well with no complications   Post-procedure details: wound care instructions given    History of melanoma in situ Neck - Posterior  Clear. Observe for recurrence. Call clinic for new or changing lesions.  Recommend regular skin exams, daily broad-spectrum spf 30+ sunscreen use, and photoprotection.     Insect bite of flank with local reaction, initial encounter Right Flank  Benign, observe.    Neoplasm of uncertain behavior Left Lower Leg - Anterior  Recommend a shave biopsy to r/o BCC/SCC, but patient declines today, but has agreed to do biopsy at 6 month follow-up  Scar Neck - Posterior  Scar with Lentigo Recheck on f/up Will observe for changes   Seborrheic keratosis Legs  Benign, observe.  Recommend Amlactin creamor Recommend [place]  Lentigines - Scattered tan macules - Discussed due to sun exposure - Benign, observe - Call for any changes  Actinic Damage - diffuse scaly erythematous macules with underlying dyspigmentation - Recommend daily broad spectrum sunscreen SPF 30+ to sun-exposed areas, reapply every 2 hours as needed.  - Call for new or changing lesions.  Melanocytic Nevi - Tan-brown  and/or pink-flesh-colored symmetric macules and papules - Benign appearing on exam today - Observation - Call clinic for new or changing moles - Recommend daily use of broad spectrum spf 30+ sunscreen to sun-exposed areas.   Acrochordons (Skin Tags) - Fleshy, skin-colored pedunculated papules - Benign appearing.  - Observe. - If desired, they can be removed with an in office procedure that is not covered by insurance. - Please call the clinic if you notice any new or changing lesions.  Return in about 6  months (around 12/25/2019) for Biopsy Left lower leg.  Marene Lenz, CMA, am acting as scribe for Brendolyn Patty, MD .  Documentation: I have reviewed the above documentation for accuracy and completeness, and I agree with the above.  Brendolyn Patty MD

## 2019-06-25 NOTE — Patient Instructions (Addendum)
Recommend daily broad spectrum sunscreen SPF 30+ to sun-exposed areas, reapply every 2 hours as needed. Call for new or changing lesions.  

## 2019-09-18 ENCOUNTER — Other Ambulatory Visit: Payer: Self-pay

## 2019-09-18 DIAGNOSIS — N200 Calculus of kidney: Secondary | ICD-10-CM

## 2019-10-08 ENCOUNTER — Ambulatory Visit: Payer: Medicare Other | Attending: Internal Medicine

## 2019-10-08 DIAGNOSIS — Z23 Encounter for immunization: Secondary | ICD-10-CM

## 2019-10-08 NOTE — Progress Notes (Signed)
   Covid-19 Vaccination Clinic  Name:  ULIS KAPS    MRN: [id number] DOB: 03-Oct-1945  10/08/2019  Jermaine Campbell was observed post Covid-19 immunization for 15 minutes without incident. He was provided with Vaccine Information Sheet and instruction to access the V-Safe system.   [name] was instructed to call 911 with any severe reactions post vaccine: Marland Kitchen Difficulty breathing  . Swelling of face and throat  . A fast heartbeat  . A bad rash all over body  . Dizziness and weakness

## 2019-10-10 ENCOUNTER — Ambulatory Visit: Payer: Medicare Other | Admitting: Urology

## 2019-10-16 ENCOUNTER — Ambulatory Visit
Admission: RE | Admit: 2019-10-16 | Discharge: 2019-10-16 | Disposition: A | Discharge status: Home / Self Care | Payer: Medicare Other | Source: Ambulatory Visit | Attending: Urology | Admitting: Urology

## 2019-10-16 ENCOUNTER — Encounter: Payer: Self-pay | Admitting: Physician Assistant

## 2019-10-16 ENCOUNTER — Other Ambulatory Visit: Payer: Self-pay

## 2019-10-16 ENCOUNTER — Ambulatory Visit
Admission: RE | Admit: 2019-10-16 | Discharge: 2019-10-16 | Disposition: A | Discharge status: Home / Self Care | Payer: Medicare Other | Attending: Urology | Admitting: Urology

## 2019-10-16 ENCOUNTER — Ambulatory Visit (INDEPENDENT_AMBULATORY_CARE_PROVIDER_SITE_OTHER): Payer: Medicare Other | Admitting: Physician Assistant

## 2019-10-16 VITALS — BP 86/55 | HR 68 | Ht 69.0 in | Wt 168.0 lb

## 2019-10-16 DIAGNOSIS — N200 Calculus of kidney: Secondary | ICD-10-CM

## 2019-10-16 DIAGNOSIS — N529 Male erectile dysfunction, unspecified: Secondary | ICD-10-CM

## 2019-10-16 DIAGNOSIS — C61 Malignant neoplasm of prostate: Secondary | ICD-10-CM

## 2019-10-16 NOTE — Progress Notes (Signed)
10/16/2019 4:15 PM   Jermaine Campbell 1946/01/05 [id number]  CC: Chief Complaint  Patient presents with  . Nephrolithiasis    HPI: Jermaine Campbell is a 74 y.o. male with PMH prostate cancer s/p EBRT with ADT discontinued secondary to MI s/p three-vessel CABG, BPH with BOO s/p HOLEP, hematuria with work-up in 2020 reassuring for malignancy, and nephrolithiasis who presents today for stone follow-up.  KUB today reveals interval clearance of an incidental 3 mm distal left ureteral stone.  He denies gross hematuria or pain and is not sure if he ever saw a stone pass.  He follows with [facility] radiation oncology for his prostate cancer treatment.  He is scheduled for follow-up with them in the next 30 to 60 days.  Most recent PSA dated 09/24/2019 0.42, slightly uptrending over the past year.  Per chart review, there are plans to obtain testosterone level today versus at his next follow-up appointment at [facility].  Additionally, patient reports erectile dysfunction since HOLEP and is curious if supplemental testosterone would improve this problem.  PMH: Past Medical History:  Diagnosis Date  . Anemia   . Bradycardia   . 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 Cancer of prostate ([place]) 2019  . Coronary artery disease    atherosclerosis of native coronary artery of native heart without angina pectoris  . Deaf    Left ear  . Dysphagia    history of d/t throat cancer  . Dysrhythmia    paroxysmal atrial fibrillation, bradycardia  . H/O syncope    several yrs ago  . History of BPH   . Hypercholesteremia   . Hypertension   . Hypothyroidism    s/p radiation tx - throat CA  . Melanoma ([place]) 06/11/2018   Melanoma In Situ Posterior neck  . Myocardial infarction ([place])    1996  . Neck stiffness    s/p radiation tx - Throat CA  . Seizures ([place])    last one 2 yrs ago. not medicated. d/t injury to frontal lobe  . Vertigo    no episodes  4-5 yrs/dizziness  . Wears hearing aid    Right ear/ deaf in left ear    Surgical History: Past Surgical History:  Procedure Laterality Date  . BICEPT TENODESIS Right 01/26/2016   Procedure: BICEPS TENODESIS- open;  Surgeon: Corky Mull, MD;  Location: [facility] ORS;  Service: Orthopedics;  Laterality: Right;  . CARDIAC CATHETERIZATION     before CABG/ DR PARACHOS IS CARDIOLOGIST  . CATARACT EXTRACTION W/PHACO Left 07/30/2014   Procedure: CATARACT EXTRACTION PHACO AND INTRAOCULAR LENS PLACEMENT ([place]) SHUGARCAINE;  Surgeon: Leandrew Koyanagi, MD;  Location: [place];  Service: Ophthalmology;  Laterality: Left;  SHUGARCAINE  . CATARACT EXTRACTION W/PHACO Right 10/29/2014   Procedure: CATARACT EXTRACTION PHACO AND INTRAOCULAR LENS PLACEMENT (IOC);  Surgeon: Leandrew Koyanagi, MD;  Location: [place];  Service: Ophthalmology;  Laterality: Right;  [place]  . COLONOSCOPY    . COLONOSCOPY WITH PROPOFOL N/A 04/14/2017   Procedure: COLONOSCOPY WITH PROPOFOL;  Surgeon: Manya Silvas, MD;  Location: [facility] ENDOSCOPY;  Service: Endoscopy;  Laterality: N/A;  . CORONARY ARTERY BYPASS GRAFT  1996   3 vessel  . EYE SURGERY Bilateral 2015   cataract extractions  . FRACTURE SURGERY     right arm. as a child  . HOLEP-LASER ENUCLEATION OF THE PROSTATE WITH MORCELLATION N/A 08/23/2017   Procedure: HOLEP-LASER ENUCLEATION OF THE  PROSTATE WITH MORCELLATION;  Surgeon: Hollice Espy, MD;  Location: [facility] ORS;  Service: Urology;  Laterality: N/A;  . OPEN SUBSCAPULARIS REPAIR Right 01/26/2016   Procedure: arthroscopic  SUBSCAPULARIS REPAIR;  Surgeon: Corky Mull, MD;  Location: [facility] ORS;  Service: Orthopedics;  Laterality: Right;  . SHOULDER ARTHROSCOPY WITH OPEN ROTATOR CUFF REPAIR Right 01/26/2016   Procedure: SHOULDER ARTHROSCOPY WITH MINI OPEN ROTATOR CUFF REPAIR;  Surgeon: Corky Mull, MD;  Location: [facility] ORS;  Service: Orthopedics;  Laterality: Right;  . SHOULDER ARTHROSCOPY WITH  SUBACROMIAL DECOMPRESSION Right 01/26/2016   Procedure: SHOULDER ARTHROSCOPY WITH SUBACROMIAL DECOMPRESSION and debridement;  Surgeon: Corky Mull, MD;  Location: [facility] ORS;  Service: Orthopedics;  Laterality: Right;  . THROAT SURGERY     excision - cancer  . TONSILLECTOMY      Home Medications:  Allergies as of 10/16/2019      Reactions   Tamsulosin Hcl Other (See Comments)   Makes blood pressure drop low and pass out      Medication List       Accurate as of October 16, 2019  4:15 PM. If you have any questions, ask your nurse or doctor.        ALPRAZolam 0.5 MG tablet Commonly known as: XANAX Place 0.25 mg into feeding tube at bedtime as needed for sleep.   atorvastatin 80 MG tablet Commonly known as: LIPITOR Place 80 mg into feeding tube daily.   bicalutamide 50 MG tablet Commonly known as: CASODEX Take 50 mg by mouth daily.   fluticasone 50 MCG/ACT nasal spray Commonly known as: FLONASE Place 1 spray into both nostrils as needed.   isosorbide dinitrate 10 MG tablet Commonly known as: ISORDIL Place 0.5 mg into feeding tube 2 (two) times daily.   ketoconazole 2 % cream Commonly known as: NIZORAL Apply topically.   leuprolide 1 MG/0.2ML injection Commonly known as: LUPRON Inject into the skin every 3 (three) months.   levothyroxine 100 MCG tablet Commonly known as: SYNTHROID Take 100 mcg by mouth daily before breakfast.   metFORMIN 500 MG tablet Commonly known as: GLUCOPHAGE Place 500 mg into feeding tube 2 (two) times daily with a meal.   metoprolol succinate 25 MG 24 hr tablet Commonly known as: TOPROL-XL   metoprolol tartrate 25 MG tablet Commonly known as: LOPRESSOR Take 12.5 mg by mouth 2 (two) times daily.   mupirocin ointment 2 % Commonly known as: BACTROBAN   ondansetron 4 MG tablet Commonly known as: ZOFRAN Place 4 mg into feeding tube every 8 (eight) hours as needed for nausea or vomiting.   polyethylene glycol 17 g packet Commonly  known as: MIRALAX / GLYCOLAX Take 17 g by mouth daily.   simvastatin 40 MG tablet Commonly known as: ZOCOR Take 40 mg by mouth daily.   tamsulosin 0.4 MG Caps capsule Commonly known as: FLOMAX Take 1 capsule (0.4 mg total) by mouth daily.   vitamin B-12 1000 MCG tablet Commonly known as: CYANOCOBALAMIN Take 1,000 mcg by mouth daily.   Xarelto 20 MG Tabs tablet Generic drug: rivaroxaban       Allergies:  Allergies  Allergen Reactions  . Tamsulosin Hcl Other (See Comments)    Makes blood pressure drop low and pass out    Family History: Family History  Problem Relation Age of Onset  . Heart attack Father     Social History:   reports that he has never smoked. He has never used smokeless tobacco. He reports that he does not  drink alcohol and does not use drugs.  Physical Exam: BP (!) 86/55   Pulse 68   Ht 5\' 9"  (1.753 m)   Wt 168 lb (76.2 kg)   BMI 24.81 kg/m   Constitutional:  Alert and oriented, no acute distress, nontoxic appearing HEENT: [place], AT Cardiovascular: No clubbing, cyanosis, or edema Respiratory: Normal respiratory effort, no increased work of breathing Skin: No rashes, bruises or suspicious lesions Neurologic: Grossly intact, no focal deficits, moving all 4 extremities Psychiatric: Normal mood and affect  Pertinent Imaging: KUB, 10/16/2019: CLINICAL DATA:  Nephrolithiasis  EXAM: ABDOMEN - 1 VIEW  COMPARISON:  CT of the pelvis dated 09/25/2018  FINDINGS: The bowel gas pattern is normal. No radio-opaque calculi or other significant radiographic abnormality are seen. A gastrostomy tube projects over the stomach. There is a questionable sclerotic lesion involving the right iliac bone, not well appreciated on the patient's prior CT from 2020  IMPRESSION: 1. No radiopaque kidney stones. 2. Nonobstructive bowel gas pattern. 3. Questionable sclerotic lesion in the right iliac bone, new since prior CT. A dedicated pelvic radiograph is  recommended for further evaluation of this finding.   Electronically Signed   By: Constance Holster M.D.   On: 10/16/2019 23:57  I personally reviewed the images referenced above and note clearance of the distal left ureteral stone with stable pelvic calcifications.  Assessment & Plan:   1. Nephrolithiasis Interval passage of incidental distal left ureteral stone.  No further intervention indicated.  2. Prostate cancer ([place]) Uptrending PSA managed by [facility] radiation oncology.  Patient prefers to defer testosterone draw today pending follow-up at [facility], I am in agreement with this plan.  3. Erectile dysfunction, unspecified erectile dysfunction type Likely secondary to #2 above.  I offered him a follow-up visit with SHIM and further evaluation by Dr. Bernardo Heater in 1 month.  Testosterone therapy is contraindicated given rising PSA with a history of prostate cancer.  He may require cardiac clearance from Dr. Saralyn Pilar before starting pharmacotherapy.  He is not on nitrates.  Return in about 4 weeks (around 11/13/2019) for ED visit with SHIM + annual follow-up with Dr. Bernardo Heater.  Debroah Loop, PA-C  [facility] [address], [place],  [address] [phone]

## 2019-10-17 ENCOUNTER — Telehealth: Payer: Self-pay | Admitting: Physician Assistant

## 2019-10-17 DIAGNOSIS — R937 Abnormal findings on diagnostic imaging of other parts of musculoskeletal system: Secondary | ICD-10-CM

## 2019-10-17 NOTE — Telephone Encounter (Signed)
Please contact the patient and inform him that the radiology report from his KUB yesterday has come back.  The radiologist noted some changes in the bones of his right pelvis sine his most recent CT scan and recommends a repeat x-ray to take a closer look at this.  I have placed an order today; the imaging department should be contacting him to schedule this.  I will call him with results.

## 2019-10-18 NOTE — Telephone Encounter (Signed)
[facility] notifying patient as advised. Advised patient to call back if he had any questions.

## 2019-10-22 ENCOUNTER — Ambulatory Visit
Admission: RE | Admit: 2019-10-22 | Discharge: 2019-10-22 | Disposition: A | Discharge status: Home / Self Care | Payer: Medicare Other | Attending: Physician Assistant | Admitting: Physician Assistant

## 2019-10-22 ENCOUNTER — Ambulatory Visit
Admission: RE | Admit: 2019-10-22 | Discharge: 2019-10-22 | Disposition: A | Discharge status: Home / Self Care | Payer: Medicare Other | Source: Ambulatory Visit | Attending: Physician Assistant | Admitting: Physician Assistant

## 2019-10-22 ENCOUNTER — Other Ambulatory Visit: Payer: Self-pay | Admitting: Physician Assistant

## 2019-10-22 DIAGNOSIS — R937 Abnormal findings on diagnostic imaging of other parts of musculoskeletal system: Secondary | ICD-10-CM

## 2019-10-24 ENCOUNTER — Telehealth: Payer: Self-pay | Admitting: Physician Assistant

## 2019-10-24 NOTE — Telephone Encounter (Signed)
Please contact the patient and inform him that his repeat pelvic x-ray was clear.  The previously seen changes in the bone of his right pelvis are no longer visible and were likely due to positioning or overlying bowel gas.  Nothing further to do.  He should keep his scheduled follow-up as planned.

## 2019-10-25 NOTE — Telephone Encounter (Signed)
Patient's wife Diane notified and voiced understanding.

## 2019-11-22 ENCOUNTER — Encounter: Payer: Self-pay | Admitting: Urology

## 2019-11-22 ENCOUNTER — Ambulatory Visit (INDEPENDENT_AMBULATORY_CARE_PROVIDER_SITE_OTHER): Payer: Medicare Other | Admitting: Urology

## 2019-11-22 ENCOUNTER — Other Ambulatory Visit: Payer: Self-pay

## 2019-11-22 VITALS — BP 124/70 | HR 68 | Ht 69.0 in | Wt 168.0 lb

## 2019-11-22 DIAGNOSIS — N5201 Erectile dysfunction due to arterial insufficiency: Secondary | ICD-10-CM

## 2019-11-22 NOTE — Progress Notes (Signed)
11/22/2019 1:14 PM   Jermaine Campbell 1945/02/06 [id number]  Referring provider: Marinda Elk, MD [facility] [place],  [place] [address]  Chief Complaint  Patient presents with   Erectile Dysfunction    Urologic history: 1.  T1c high risk prostate cancer (Gleason 4+5, 6/13 cores) -EBRT 11/06/2017; scheduled for brachytherapy boost however canceled secondary to NSTEMI and underwent redo CABG -SBRT boost completed 06/06/2018 -ADT was discontinued secondary to MI  2.  BPH with urinary retention -Developed urinary retention prior to IMRT and s/p HoLEP 07/2017  3.  Erectile dysfunction  4.  Recurrent stone disease  HPI: 74 y.o. male who presents for follow-up.   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   Cancer of prostate ([place]) 2019   Coronary artery disease    atherosclerosis of native coronary artery of native heart without angina pectoris   Deaf    Left ear   Dysphagia    history of d/t throat cancer   Dysrhythmia    paroxysmal atrial fibrillation, bradycardia   H/O syncope    several yrs ago   History of BPH    Hypercholesteremia    Hypertension    Hypothyroidism    s/p radiation tx - throat CA   Melanoma ([place]) 06/11/2018   Melanoma In Situ Posterior neck   Myocardial infarction ([place])    1996   Neck stiffness    s/p radiation tx - Throat CA    Seizures ([place])    last one 2 yrs ago. not medicated. d/t injury to frontal lobe   Vertigo    no episodes 4-5 yrs/dizziness   Wears hearing aid    Right ear/ deaf in left ear    Surgical History: Past Surgical History:  Procedure Laterality Date   BICEPT TENODESIS Right 01/26/2016   Procedure: BICEPS TENODESIS- open;  Surgeon: Corky Mull, MD;  Location: [facility] OR[facility];  Service: Orthopedics;  Laterality: Right;   CARDIAC CATHETERIZATION     before CABG/ DR PARACHOS IS CARDIOLOGIST   CATARACT EXTRACTION W/PHACO Left 07/30/2014   Procedure: CATARACT EXTRACTION PHACO AND INTRAOCULAR LENS PLACEMENT ([place]) [place];  Surgeon: Leandrew Koyanagi, MD;  Location: [place];  Service: Ophthalmology;  Laterality: Left;  SHUGARCAINE   CATARACT EXTRACTION W/PHACO Right 10/29/2014   Procedure: CATARACT EXTRACTION PHACO AND INTRAOCULAR LENS PLACEMENT (I[facility]);  Surgeon: Leandrew Koyanagi, MD;  Location: [place];  Service: Ophthalmology;  Laterality: Right;  SHUGARCAINE   COLONOSCOPY     COLONOSCOPY WITH PROPOFOL N/A 04/14/2017   Procedure: COLONOSCOPY WITH PROPOFOL;  Surgeon: Manya Silvas, MD;  Location[facility] ENDOSCOPY;  Service: Endoscopy;  Laterality: N/A;   CORONARY ARTERY BYPASS GRAFT  1996   3 vessel   EYE SURGERY Bilateral 2015   cataract extractions   FRACTURE SURGERY     right arm. as a child   HOLEP-LASER  ENUCLEATION OF THE PROSTATE WITH MORCELLATION N/A 08/23/2017   Procedure: HOLEP-LASER ENUCLEATION OF THE PROSTATE WITH MORCELLATION;  Surgeon: Hollice Espy, MD;  Location: [facility];  Service: Urology;  Laterality: N/A;   OPEN SUBSCAPULARIS REPAIR Right 01/26/2016   Procedure: arthroscopic  SUBSCAPULARIS REPAIR;  Surgeon: Corky Mull, MD;  Location: [facility] OR[facility];  Service: Orthopedics;  Laterality: Right;   SHOULDER ARTHROSCOPY WITH OPEN ROTATOR CUFF REPAIR Right 01/26/2016   Procedure: SHOULDER ARTHROSCOPY WITH MINI OPEN ROTATOR CUFF REPAIR;  Surgeon: Corky Mull, MD;  Location: [facility];  Service: Orthopedics;  Laterality: Right;   SHOULDER ARTHROSCOPY WITH SUBACROMIAL DECOMPRESSION Right 01/26/2016   Procedure: SHOULDER ARTHROSCOPY WITH SUBACROMIAL DECOMPRESSION and debridement;  Surgeon: Corky Mull, MD;  Location: [facility];  Service: Orthopedics;  Laterality: Right;   THROAT SURGERY     excision - cancer   TONSILLECTOMY      Home Medications:  Allergies as of 11/22/2019      Reactions   Tamsulosin Hcl Other (See Comments)   Makes blood pressure drop low and pass out      Medication List       Accurate as of November 22, 2019  1:14 PM. If you have any questions, ask your nurse or doctor.        ALPRAZolam 0.5 MG tablet Commonly known as: XANAX Place 0.25 mg into feeding tube at bedtime as needed for sleep.   atorvastatin 80 MG tablet Commonly known as: LIPITOR Place 80 mg into feeding tube daily.   bicalutamide 50 MG tablet Commonly known as: CASODEX Take 50 mg by mouth daily.   fluticasone 50 MCG/ACT nasal spray Commonly known as: FLONASE Place 1 spray into both nostrils as needed.   isosorbide dinitrate 10 MG tablet Commonly known as: ISORDIL Place 0.5 mg into feeding tube 2 (two) times daily.   ketoconazole 2 % cream Commonly known as: NIZORAL Apply topically.   leuprolide 1 MG/0.2ML injection Commonly known as: LUPRON Inject into the skin every 3 (three) months.   levothyroxine 100 MCG tablet Commonly known as: SYNTHROID Take 100 mcg by mouth daily before breakfast.   metFORMIN 500 MG tablet Commonly known as: GLUCOPHAGE Place 500 mg into feeding tube 2 (two) times daily with a meal.   metoprolol succinate 25 MG 24 hr tablet Commonly known as: TOPROL-XL   metoprolol tartrate 25 MG tablet Commonly known as: LOPRESSOR Take 12.5 mg by mouth 2 (two) times daily.   mupirocin ointment 2 % Commonly known as: BACTROBAN   ondansetron 4 MG tablet Commonly known as: ZOFRAN Place 4 mg into feeding tube  every 8 (eight) hours as needed for nausea or vomiting.   polyethylene glycol 17 g packet Commonly known as: MIRALAX / GLYCOLAX Take 17 g by mouth daily.   simvastatin 40 MG tablet Commonly known as: ZOCOR Take 40 mg by mouth daily.   tamsulosin 0.4 MG Caps capsule Commonly known as: FLOMAX Take 1 capsule (0.4 mg total) by mouth daily.   vitamin B-12 1000 MCG tablet Commonly known as: CYANOCOBALAMIN Take 1,000 mcg by mouth daily.   Xarelto 20 MG Tabs tablet Generic drug: rivaroxaban       Allergies:  Allergies  Allergen Reactions   Tamsulosin Hcl Other (See Comments)    Makes blood pressure drop low and pass out    Family History: Family History  Problem Relation Age of Onset   Heart attack Father     Social History:  reports that he has  never smoked. He has never used smokeless tobacco. He reports that he does not drink alcohol and does not use drugs.   Physical Exam: BP 124/70    Pulse 68    Ht 5\' 9"  (1.753 m)    Wt 168 lb (76.2 kg)    BMI 24.81 kg/m   Constitutional:  Alert and oriented, No acute distress. HEENT: [place] AT, moist mucus membranes.  Trachea midline, no masses. Cardiovascular: No clubbing, cyanosis, or edema. Respiratory: Normal respiratory effort, no increased work of breathing. GI: Abdomen is soft, nontender, nondistended, no abdominal masses GU: No CVA tenderness Lymph: No cervical or inguinal lymphadenopathy. Skin: No rashes, bruises or suspicious lesions. Neurologic:  Grossly intact, no focal deficits, moving all 4 extremities. Psychiatric: Normal mood and affect.   Assessment & Plan:    1.  Erectile dysfunction  We discussed low testosterone would not be a cause of his significant erectile dysfunction and with a strong history of coronary artery disease vascular insufficiency will be the primary etiology  Unlikely a PDE 5 inhibitor would be effective since he has no significant erectile activity however he was interested in trying.   Isosorbide is still listed on his medication list with [place] but does not appear to be active.  Will message his there is no contraindication to a PDE 5 inhibitor trial  We also discussed intracavernosal injections and vacuum erection devices.  He was provided literature on intracavernosal injections   Abbie Sons, MD  [facility] [address] [place], [place] [address] [phone]

## 2019-11-23 ENCOUNTER — Encounter: Payer: Self-pay | Admitting: Urology

## 2019-11-23 DIAGNOSIS — N5201 Erectile dysfunction due to arterial insufficiency: Secondary | ICD-10-CM | POA: Insufficient documentation

## 2019-12-16 ENCOUNTER — Telehealth: Payer: Self-pay | Admitting: *Deleted

## 2019-12-16 NOTE — Telephone Encounter (Signed)
Pt calling asking if he can take ED meds? Pt states we where supposed to call his cardiologist to get approved. Please advise   Erectile dysfunction  We discussed low testosterone would not be a cause of his significant erectile dysfunction and with a strong history of coronary artery disease vascular insufficiency will be the primary etiology  Unlikely a PDE 5 inhibitor would be effective since he has no significant erectile activity however he was interested in trying.  Isosorbide is still listed on his medication list with [place] but does not appear to be active.  Will message his there is no contraindication to a PDE 5 inhibitor trial  We also discussed intracavernosal injections and vacuum erection devices.  He was provided literature on intracavernosal injections

## 2019-12-17 NOTE — Telephone Encounter (Signed)
I have not heard back from his cardiologist.  Will recheck again however he may want to send his cardiologist a my chart message asking him about ED meds.  Feel it is unlikely that ED meds will be beneficial per our last office visit

## 2019-12-17 NOTE — Telephone Encounter (Signed)
Notified patient as instructed,.  

## 2019-12-30 ENCOUNTER — Encounter: Payer: Self-pay | Admitting: Dermatology

## 2019-12-30 ENCOUNTER — Other Ambulatory Visit: Payer: Self-pay

## 2019-12-30 ENCOUNTER — Ambulatory Visit (INDEPENDENT_AMBULATORY_CARE_PROVIDER_SITE_OTHER): Payer: Medicare Other | Admitting: Dermatology

## 2019-12-30 DIAGNOSIS — L578 Other skin changes due to chronic exposure to nonionizing radiation: Secondary | ICD-10-CM

## 2019-12-30 DIAGNOSIS — D229 Melanocytic nevi, unspecified: Secondary | ICD-10-CM

## 2019-12-30 DIAGNOSIS — D485 Neoplasm of uncertain behavior of skin: Secondary | ICD-10-CM

## 2019-12-30 DIAGNOSIS — Z1283 Encounter for screening for malignant neoplasm of skin: Secondary | ICD-10-CM | POA: Diagnosis not present

## 2019-12-30 DIAGNOSIS — Z86006 Personal history of melanoma in-situ: Secondary | ICD-10-CM

## 2019-12-30 DIAGNOSIS — L905 Scar conditions and fibrosis of skin: Secondary | ICD-10-CM

## 2019-12-30 DIAGNOSIS — L814 Other melanin hyperpigmentation: Secondary | ICD-10-CM

## 2019-12-30 DIAGNOSIS — C44719 Basal cell carcinoma of skin of left lower limb, including hip: Secondary | ICD-10-CM

## 2019-12-30 DIAGNOSIS — L821 Other seborrheic keratosis: Secondary | ICD-10-CM

## 2019-12-30 DIAGNOSIS — L820 Inflamed seborrheic keratosis: Secondary | ICD-10-CM

## 2019-12-30 DIAGNOSIS — I781 Nevus, non-neoplastic: Secondary | ICD-10-CM

## 2019-12-30 DIAGNOSIS — L280 Lichen simplex chronicus: Secondary | ICD-10-CM | POA: Diagnosis not present

## 2019-12-30 DIAGNOSIS — L219 Seborrheic dermatitis, unspecified: Secondary | ICD-10-CM

## 2019-12-30 DIAGNOSIS — C4491 Basal cell carcinoma of skin, unspecified: Secondary | ICD-10-CM

## 2019-12-30 DIAGNOSIS — D1800 Hemangioma unspecified site: Secondary | ICD-10-CM

## 2019-12-30 HISTORY — DX: Basal cell carcinoma of skin, unspecified: C44.91

## 2019-12-30 NOTE — Progress Notes (Signed)
Follow-Up Visit   Subjective  Jermaine Campbell is a 74 y.o. 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   The following portions of the chart were reviewed this encounter and updated as appropriate:      Review of Systems:  No other skin or systemic complaints except as noted in HPI or Assessment and Plan.  Objective  Well appearing patient in no apparent distress; mood and affect are within normal limits.  All skin waist up examined.  Objective  Posterior neck: Well healed scar with no evidence of recurrence.   Objective  Right Posterior Neck: Pink white smooth patch  anterior edge with 7 mm gray-brown slightly depressed macule.  Patient has had radiation of neck in past.  No changes when compared to photo.  Objective  L upper calf: Lichenified patch  Objective  Left Lower Pretibia: 1.7 x 1.5cm pink pearly plaque with crusting        Objective  Nasal tip: Pink blanching papule.  Objective  Posterior Scalp: Pink excoriated papules   Objective  Left Lower Pretibia: Erythematous keratotic or waxy stuck-on papule 68mm   Assessment & Plan   Skin cancer screening performed today.  Actinic Damage - chronic, secondary to cumulative UV radiation exposure/sun exposure over time - diffuse scaly erythematous macules with underlying dyspigmentation - Recommend daily broad spectrum sunscreen SPF 30+ to sun-exposed areas, reapply every 2 hours as needed.  - Call for new or changing lesions.  Seborrheic Keratoses - Stuck-on, waxy, tan-brown papules and plaques  - Discussed benign etiology and prognosis. - Observe - Call for any changes  Melanocytic Nevi - Tan-brown and/or pink-flesh-colored symmetric macules and papules - Benign appearing on exam today - Observation - Call  clinic for new or changing moles - Recommend daily use of broad spectrum spf 30+ sunscreen to sun-exposed areas.   Lentigines - Scattered tan macules - Discussed due to sun exposure - Benign, observe - Recommend daily broad spectrum sunscreen SPF 30+ to sun-exposed areas, reapply every 2 hours as needed. - Call for any changes  Hemangiomas - Red papules - Discussed benign nature - Observe - Call for any changes    History of melanoma in situ Posterior neck  Clear. Observe for recurrence. Call clinic for new or changing lesions.  Recommend regular skin exams, daily broad-spectrum spf 30+ sunscreen use, and photoprotection.     Scar Right Posterior Neck  Vrs chronic radiation skin changes Benign-appearing, stable, observe.    Lichen simplex chronicus L upper calf  AmLactin or [employer] Bumpy twice daily, avoid rubbing area.  Neoplasm of uncertain behavior of skin Left Lower Pretibia  Skin / nail biopsy Type of biopsy: tangential   Informed consent: discussed and consent obtained   Patient was prepped and draped in usual sterile fashion: Area prepped with alcohol. Anesthesia: the lesion was anesthetized in a standard fashion   Anesthetic:  1% lidocaine w/ epinephrine 1-100,000 buffered w/ 8.4% NaHCO3 Instrument used: flexible razor blade   Hemostasis achieved with: pressure, aluminum chloride and electrodesiccation   Outcome: patient tolerated procedure well    Destruction of lesion  Destruction method: electrodesiccation and curettage   Informed consent: discussed and consent obtained   Timeout:  patient name, date of birth, surgical site, and procedure verified Curettage performed in  three different directions: Yes   Electrodesiccation performed over the curetted area: Yes   Lesion length (cm):  1.7 Lesion width (cm):  1.5 Margin per side (cm):  0.3 Final wound size (cm):  2.3 Hemostasis achieved with:  pressure, aluminum chloride and  electrodesiccation Outcome: patient tolerated procedure well with no complications   Post-procedure details: wound care instructions given   Additional details:  Mupirocin ointment and Bandaid applied    ISK inf to biopsy site, also treated with EDC today.  Telangiectasias Nasal tip  Benign, observe.    Seborrheic dermatitis Posterior Scalp  Vrs scalp folliculitis Start Head & Shoulders shampoo - sample given. Massage into scalp 2-3 times a week, let sit several minutes before rinsing. Avoid picking.  Inflamed seborrheic keratosis Left Lower Pretibia  Inferior to biopsy site, see photo.  Destruction of lesion - Left Lower Pretibia  Destruction method: electrodesiccation and curettage   Informed consent: discussed and consent obtained   Timeout:  patient name, date of birth, surgical site, and procedure verified Anesthesia: the lesion was anesthetized in a standard fashion   Anesthetic:  1% lidocaine w/ epinephrine 1-100,000 buffered w/ 8.4% NaHCO3 Curettage performed in three different directions: Yes   Electrodesiccation performed over the curetted area: Yes   Hemostasis achieved with:  pressure, aluminum chloride and electrodesiccation Outcome: patient tolerated procedure well with no complications   Post-procedure details: wound care instructions given   Additional details:  Mupirocin ointment and Bandaid applied    Return in about 2 months (around 03/01/2020) for f/u bx.   IJamesetta Orleans, CMA, am acting as scribe for Brendolyn Patty, MD .  Documentation: I have reviewed the above documentation for accuracy and completeness, and I agree with the above.  Brendolyn Patty MD

## 2019-12-30 NOTE — Patient Instructions (Addendum)
Head & Shoulders - Massage into scalp 2-3 times a week, let sit several minutes before rinsing.  Start AmLactin or [employer] & Bumpy to rough area on right lower leg twice a day.  Wound Care Instructions  1. Cleanse wound gently with soap and water once a day then pat dry with clean gauze. Apply a thing coat of Petrolatum (petroleum jelly, "Vaseline") over the wound (unless you have an allergy to this). We recommend that you use a new, sterile tube of Vaseline. Do not pick or remove scabs. Do not remove the yellow or white "healing tissue" from the base of the wound.  2. Cover the wound with fresh, clean, nonstick gauze and secure with paper tape. You may use Band-Aids in place of gauze and tape if the would is small enough, but would recommend trimming much of the tape off as there is often too much. Sometimes Band-Aids can irritate the skin.  3. You should call the office for your biopsy report after 1 week if you have not already been contacted.  4. If you experience any problems, such as abnormal amounts of bleeding, swelling, significant bruising, significant pain, or evidence of infection, please call the office immediately.

## 2020-01-01 ENCOUNTER — Telehealth: Payer: Self-pay

## 2020-01-01 NOTE — Telephone Encounter (Signed)
-----   Message from Brendolyn Patty, MD sent at 12/31/2019  4:31 PM EST ----- Skin , left lower pretibia BASAL CELL CARCINOMA, NODULAR PATTERN  BCC skin cancer- already treated with [facility]

## 2020-01-01 NOTE — Telephone Encounter (Signed)
Advised pts wife of bx results/sh ?

## 2020-02-03 ENCOUNTER — Other Ambulatory Visit: Payer: Medicare Other

## 2020-03-10 ENCOUNTER — Other Ambulatory Visit: Payer: Self-pay

## 2020-03-10 ENCOUNTER — Ambulatory Visit (INDEPENDENT_AMBULATORY_CARE_PROVIDER_SITE_OTHER): Payer: Medicare Other | Admitting: Dermatology

## 2020-03-10 DIAGNOSIS — Z85828 Personal history of other malignant neoplasm of skin: Secondary | ICD-10-CM

## 2020-03-10 DIAGNOSIS — D692 Other nonthrombocytopenic purpura: Secondary | ICD-10-CM

## 2020-03-10 NOTE — Progress Notes (Signed)
   Follow-Up Visit   Subjective  Jermaine Campbell is a 75 y.o. male who presents for the following: 2 month follow  (Patient here today for follow up on left lower pretia. Biopsy proved bcc nodular pattern. Edc was done already. Patient has no concerns today at follow up.). BCC treated at time of biopsy.    The following portions of the chart were reviewed this encounter and updated as appropriate:       Objective  Well appearing patient in no apparent distress; mood and affect are within normal limits.  A focused examination was performed including left lower leg and right lower leg. Relevant physical exam findings are noted in the Assessment and Plan.  Objective  left lower pretibia: Pink violaceous scar   Assessment & Plan  History of basal cell carcinoma (BCC) left lower pretibia  Clear. Observe for recurrence. Call clinic for new or changing lesions.  Recommend regular skin exams, daily broad-spectrum spf 30+ sunscreen use, and photoprotection.     Purpura - Chronic; persistent and recurrent.  Treatable, but not curable. On left lower leg dark purple patch - Violaceous macules and patches - Benign - Related to trauma, age, sun damage and/or use of blood thinners, chronic use of topical and/or oral steroids - Observe - Can use OTC arnica containing moisturizer such as Dermend Bruise Formula if desired - Call for worsening or other concerns   Return in about 6 months (around 09/07/2020) for tbse.  I, Ruthell Rummage, CMA, am acting as scribe for Brendolyn Patty, MD.  Documentation: I have reviewed the above documentation for accuracy and completeness, and I agree with the above.  Brendolyn Patty MD

## 2020-05-15 ENCOUNTER — Emergency Department: Payer: Medicare Other

## 2020-05-15 ENCOUNTER — Other Ambulatory Visit: Payer: Self-pay

## 2020-05-15 ENCOUNTER — Emergency Department
Admission: EM | Admit: 2020-05-15 | Discharge: 2020-05-15 | Disposition: A | Discharge status: Home / Self Care | Payer: Medicare Other | Attending: Emergency Medicine | Admitting: Emergency Medicine

## 2020-05-15 ENCOUNTER — Encounter: Payer: Self-pay | Admitting: Emergency Medicine

## 2020-05-15 DIAGNOSIS — Z85828 Personal history of other malignant neoplasm of skin: Secondary | ICD-10-CM | POA: Insufficient documentation

## 2020-05-15 DIAGNOSIS — E039 Hypothyroidism, unspecified: Secondary | ICD-10-CM | POA: Insufficient documentation

## 2020-05-15 DIAGNOSIS — Z85818 Personal history of malignant neoplasm of other sites of lip, oral cavity, and pharynx: Secondary | ICD-10-CM | POA: Insufficient documentation

## 2020-05-15 DIAGNOSIS — I10 Essential (primary) hypertension: Secondary | ICD-10-CM | POA: Diagnosis not present

## 2020-05-15 DIAGNOSIS — I2510 Atherosclerotic heart disease of native coronary artery without angina pectoris: Secondary | ICD-10-CM | POA: Insufficient documentation

## 2020-05-15 DIAGNOSIS — Z931 Gastrostomy status: Secondary | ICD-10-CM

## 2020-05-15 DIAGNOSIS — Z79899 Other long term (current) drug therapy: Secondary | ICD-10-CM | POA: Diagnosis not present

## 2020-05-15 DIAGNOSIS — Z8546 Personal history of malignant neoplasm of prostate: Secondary | ICD-10-CM | POA: Diagnosis not present

## 2020-05-15 DIAGNOSIS — K9423 Gastrostomy malfunction: Secondary | ICD-10-CM | POA: Insufficient documentation

## 2020-05-15 DIAGNOSIS — Z7982 Long term (current) use of aspirin: Secondary | ICD-10-CM | POA: Diagnosis not present

## 2020-05-15 NOTE — ED Provider Notes (Signed)
[facility] Emergency Department Provider Note   ____________________________________________    I have reviewed the triage vital signs and the nursing notes.   HISTORY  Chief Complaint feeding tube     HPI Jermaine Campbell is a 75 y.o. male with extensive past medical history as detailed below who presents after his G-tube fell out, patient reports he has a G-tube since 2018, it fell out today.  He has no further complaints  Past Medical History:  Diagnosis Date  . Anemia   . Basal cell carcinoma 12/30/2019   L lower pretibia, EDC  . Bradycardia   . 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 Cancer of prostate ([place]) 2019  . Coronary artery disease    atherosclerosis of native coronary artery of native heart without angina pectoris  . Deaf    Left ear  . Dysphagia    history of d/t throat cancer  . Dysrhythmia    paroxysmal atrial fibrillation, bradycardia  . H/O syncope    several yrs ago  . History of BPH   . Hypercholesteremia   . Hypertension   . Hypothyroidism    s/p radiation tx - throat CA  . Melanoma ([place]) 06/11/2018   Melanoma In Situ Posterior neck  . Myocardial infarction ([place])    1996  . Neck stiffness    s/p radiation tx - Throat CA  . Seizures ([place])    last one 2 yrs ago. not medicated. d/t injury to frontal lobe  . Vertigo    no episodes 4-5 yrs/dizziness  . Wears hearing aid    Right ear/ deaf in left ear    Patient Active Problem List   Diagnosis Date Noted  . Erectile dysfunction due to arterial insufficiency 11/23/2019  . Bradycardia 09/14/2018  . Status post right rotator cuff repair 07/16/2018  . 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 Nonrheumatic aortic valve stenosis 11/23/2017  . Prostate cancer ([place]) 08/08/2017  .  Pharyngeal dysphagia 06/28/2017  . Paroxysmal atrial fibrillation ([place]) 01/04/2017  . Elevated PSA 12/28/2016  . BPH with obstruction/lower urinary tract symptoms 12/28/2016  . Oral lesion 06/08/2016  . Carpal tunnel syndrome of right wrist 05/06/2016  . Complete tear of right rotator cuff 12/04/2015  . Rotator cuff tendinitis, right 12/04/2015  . Spondylosis of cervical region without myelopathy or radiculopathy 12/04/2015  . Benign essential hypertension 08/05/2013  . CAD (coronary artery disease), native coronary artery 08/05/2013  . Hearing loss 08/05/2013  . Other specified cardiac arrhythmias 08/05/2013  . Hyperlipidemia, mixed 11/14/2012  . Increased frequency of urination 11/14/2012  . 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 Nocturia 11/14/2012  . 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 Status post chemoradiation 11/14/2012  . Hypothyroidism 01/09/2012    Past Surgical History:  Procedure Laterality Date  . BICEPT TENODESIS Right 01/26/2016   Procedure: BICEPS TENODESIS- open;  Surgeon: Corky Mull, MD;  Location: [facility] ORS;  Service: Orthopedics;  Laterality: Right;  . CARDIAC CATHETERIZATION     before CABG/ DR PARACHOS IS CARDIOLOGIST  . CATARACT EXTRACTION W/PHACO Left 07/30/2014   Procedure: CATARACT EXTRACTION PHACO AND INTRAOCULAR LENS PLACEMENT ([place]) SHUGARCAINE;  Surgeon: Leandrew Koyanagi, MD;  Location: [place];  Service: Ophthalmology;  Laterality: Left;  SHUGARCAINE  . CATARACT EXTRACTION W/PHACO Right 10/29/2014  Procedure: CATARACT EXTRACTION PHACO AND INTRAOCULAR LENS PLACEMENT (I[facility]);  Surgeon: Leandrew Koyanagi, MD;  Location: [place];  Service: Ophthalmology;  Laterality: Right;  [place]  . COLONOSCOPY    . COLONOSCOPY WITH PROPOFOL N/A 04/14/2017   Procedure: COLONOSCOPY WITH PROPOFOL;  Surgeon: Manya Silvas, MD;  Location: [facility] ENDOSCOPY;  Service: Endoscopy;  Laterality: N/A;  . CORONARY ARTERY BYPASS GRAFT   1996   3 vessel  . EYE SURGERY Bilateral 2015   cataract extractions  . FRACTURE SURGERY     right arm. as a child  . HOLEP-LASER ENUCLEATION OF THE PROSTATE WITH MORCELLATION N/A 08/23/2017   Procedure: HOLEP-LASER ENUCLEATION OF THE PROSTATE WITH MORCELLATION;  Surgeon: Hollice Espy, MD;  Location: [facility] ORS;  Service: Urology;  Laterality: N/A;  . OPEN SUBSCAPULARIS REPAIR Right 01/26/2016   Procedure: arthroscopic  SUBSCAPULARIS REPAIR;  Surgeon: Corky Mull, MD;  Location: [facility] ORS;  Service: Orthopedics;  Laterality: Right;  . SHOULDER ARTHROSCOPY WITH OPEN ROTATOR CUFF REPAIR Right 01/26/2016   Procedure: SHOULDER ARTHROSCOPY WITH MINI OPEN ROTATOR CUFF REPAIR;  Surgeon: Corky Mull, MD;  Location: [facility] ORS;  Service: Orthopedics;  Laterality: Right;  . SHOULDER ARTHROSCOPY WITH SUBACROMIAL DECOMPRESSION Right 01/26/2016   Procedure: SHOULDER ARTHROSCOPY WITH SUBACROMIAL DECOMPRESSION and debridement;  Surgeon: Corky Mull, MD;  Location: [facility] ORS;  Service: Orthopedics;  Laterality: Right;  . THROAT SURGERY     excision - cancer  . TONSILLECTOMY      Prior to Admission medications   Medication Sig Start Date End Date Taking? Authorizing Provider  aspirin EC 81 MG tablet Take 81 mg by mouth daily. Swallow whole.    [provider]  levothyroxine (SYNTHROID, LEVOTHROID) 100 MCG tablet Take 100 mcg by mouth daily before breakfast.     [provider]  metFORMIN (GLUCOPHAGE) 500 MG tablet Place 500 mg into feeding tube 2 (two) times daily with a meal.    [provider]  metoprolol tartrate (LOPRESSOR) 25 MG tablet Take 12.5 mg by mouth 2 (two) times daily.     [provider]  simvastatin (ZOCOR) 40 MG tablet Take 40 mg by mouth daily.     [provider]  vitamin B-12 (CYANOCOBALAMIN) 1000 MCG tablet Take 1,000 mcg by mouth daily.    [provider]  XARELTO 20 MG TABS tablet  09/10/18   [provider]      Allergies Tamsulosin hcl  Family History  Problem Relation Age of Onset  . Heart attack Father     Social History Social History   Tobacco Use  . Smoking status: Never Smoker  . Smokeless tobacco: Never Used  Vaping Use  . Vaping Use: Never used  Substance Use Topics  . Alcohol use: No    Comment: rarely  . Drug use: No    Review of Systems  Constitutional: No fever/chills     Gastrointestinal: No abdominal pain.  No nausea, no vomiting.    Skin: Negative for rash.     ____________________________________________   PHYSICAL EXAM:  VITAL SIGNS: ED Triage Vitals  Enc Vitals Group     BP 05/15/20 1838 108/82     Pulse Rate 05/15/20 1838 85     Resp 05/15/20 1838 18     Temp 05/15/20 1838 97.8 F (36.6 C)     Temp Source 05/15/20 1838 Oral     SpO2 05/15/20 1838 99 %     Weight 05/15/20 1832 76.2 kg (167 lb 15.9  oz)     Height 05/15/20 1832 1.753 m (5\' 9" )     Head Circumference --      Peak Flow --      Pain Score 05/15/20 1832 0     Pain Loc --      Pain Edu? --      Excl. in [place]? --      Constitutional: Alert and oriented. No acute distress. Pleasant and interactive  Nose: No congestion/rhinnorhea. Mouth/Throat: Mucous membranes are moist.   Cardiovascular: Normal rate, regular rhythm.  Respiratory: Normal respiratory effort.  No retractions. Abdomen: G-tube displaced, patient has brought it in a bag, and is intact, 22 [place] Genitourinary: deferred Musculoskeletal: No lower extremity tenderness nor edema.   Neurologic:  Normal speech and language. No gross focal neurologic deficits are appreciated.   Skin:  Skin is warm, dry and intact.    ____________________________________________   LABS (all labs ordered are listed, but only abnormal results are displayed)  Labs Reviewed - No data to display ____________________________________________  EKG   ____________________________________________  RADIOLOGY  KUB with G-tube  contrast ____________________________________________   PROCEDURES  Procedure(s) performed:yes  Gastrostomy tube replacement Performed by: Lavonia Drafts Consent: Verbal consent obtained. Risks and benefits: risks, benefits and alternatives were discussed Required items: required blood products, implants, devices, and special equipment available Patient identity confirmed: hospital-assigned identification number Time out: Immediately prior to procedure a "time out" was called to verify the correct patient, procedure, equipment, support staff and site/side marked as required. Preparation: Patient was prepped and draped in the usual sterile fashion. Patient tolerance: Patient tolerated the procedure well with no immediate complications.  Comments: 22 [place] Gastrostomy tube placed without difficulty   Procedures   Critical Care performed: No ____________________________________________   INITIAL IMPRESSION / ASSESSMENT AND PLAN / ED COURSE  Pertinent labs & imaging results that were available during my care of the patient were reviewed by me and considered in my medical decision making (see chart for details).  Patient presents after displacement of G-tube, will obtain 62 [place] and attempt to replace  ----------------------------------------- 7:59 PM on 05/15/2020 ----------------------------------------- g-tube successfully replaced, pending confirmatory xray G-tube successfully replaced, pending Gastrografin KUB for verification appropriate for discharge of normal   ____________________________________________   FINAL CLINICAL IMPRESSION(S) / ED DIAGNOSES  Final diagnoses:  Malfunction of percutaneous endoscopic gastrostomy (PEG) tube ([place])      NEW MEDICATIONS STARTED DURING THIS VISIT:  New Prescriptions   No medications on file     Note:  This document was prepared using Dragon voice recognition software and may include unintentional dictation errors.    Lavonia Drafts, MD 05/15/20 Despina Pole

## 2020-05-15 NOTE — ED Provider Notes (Signed)
[facility] EMERGENCY DEPARTMENT Provider Note   CSN: [id number] Arrival date & time: 05/15/20  1831     History Chief Complaint  Patient presents with  . feeding tube    Jermaine Campbell is a 75 y.o. male presents to the emergency department for G-tube placement.  His G-tube fell out just prior to arrival.   HPI     Past Medical History:  Diagnosis Date  . Anemia   . Basal cell carcinoma 12/30/2019   L lower pretibia, EDC  . Bradycardia   . 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 Cancer of prostate ([place]) 2019  . Coronary artery disease    atherosclerosis of native coronary artery of native heart without angina pectoris  . Deaf    Left ear  . Dysphagia    history of d/t throat cancer  . Dysrhythmia    paroxysmal atrial fibrillation, bradycardia  . H/O syncope    several yrs ago  . History of BPH   . Hypercholesteremia   . Hypertension   . Hypothyroidism    s/p radiation tx - throat CA  . Melanoma ([place]) 06/11/2018   Melanoma In Situ Posterior neck  . Myocardial infarction ([place])    1996  . Neck stiffness    s/p radiation tx - Throat CA  . Seizures ([place])    last one 2 yrs ago. not medicated. d/t injury to frontal lobe  . Vertigo    no episodes 4-5 yrs/dizziness  . Wears hearing aid    Right ear/ deaf in left ear    Patient Active Problem List   Diagnosis Date Noted  . Erectile dysfunction due to arterial insufficiency 11/23/2019  . Bradycardia 09/14/2018  . Status post right rotator cuff repair 07/16/2018  . 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 Nonrheumatic aortic valve stenosis 11/23/2017  . Prostate cancer ([place]) 08/08/2017  . Pharyngeal dysphagia 06/28/2017  . Paroxysmal atrial fibrillation ([place]) 01/04/2017  . Elevated PSA 12/28/2016  . BPH with  obstruction/lower urinary tract symptoms 12/28/2016  . Oral lesion 06/08/2016  . Carpal tunnel syndrome of right wrist 05/06/2016  . Complete tear of right rotator cuff 12/04/2015  . Rotator cuff tendinitis, right 12/04/2015  . Spondylosis of cervical region without myelopathy or radiculopathy 12/04/2015  . Benign essential hypertension 08/05/2013  . CAD (coronary artery disease), native coronary artery 08/05/2013  . Hearing loss 08/05/2013  . Other specified cardiac arrhythmias 08/05/2013  . Hyperlipidemia, mixed 11/14/2012  . Increased frequency of urination 11/14/2012  . 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 Nocturia 11/14/2012  . 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 Status post chemoradiation 11/14/2012  . Hypothyroidism 01/09/2012    Past Surgical History:  Procedure Laterality Date  . BICEPT TENODESIS Right 01/26/2016   Procedure: BICEPS TENODESIS- open;  Surgeon: Corky Mull, MD;  Location: [facility] ORS;  Service: Orthopedics;  Laterality: Right;  . CARDIAC CATHETERIZATION     before CABG/ DR PARACHOS IS CARDIOLOGIST  . CATARACT EXTRACTION W/PHACO Left 07/30/2014   Procedure: CATARACT EXTRACTION PHACO AND INTRAOCULAR LENS PLACEMENT ([place]) SHUGARCAINE;  Surgeon: Leandrew Koyanagi, MD;  Location: [place];  Service: Ophthalmology;  Laterality: Left;  SHUGARCAINE  . CATARACT EXTRACTION W/PHACO Right 10/29/2014   Procedure: CATARACT EXTRACTION PHACO AND INTRAOCULAR LENS PLACEMENT ([facility]);  Surgeon: Nila Nephew  Brasington, MD;  Location: [place];  Service: Ophthalmology;  Laterality: Right;  [place]  . COLONOSCOPY    . COLONOSCOPY WITH PROPOFOL N/A 04/14/2017   Procedure: COLONOSCOPY WITH PROPOFOL;  Surgeon: Manya Silvas, MD;  Location: [facility] ENDOSCOPY;  Service: Endoscopy;  Laterality: N/A;  . CORONARY ARTERY BYPASS GRAFT  1996   3 vessel  . EYE SURGERY Bilateral 2015   cataract extractions  . FRACTURE SURGERY     right arm. as a child  .  HOLEP-LASER ENUCLEATION OF THE PROSTATE WITH MORCELLATION N/A 08/23/2017   Procedure: HOLEP-LASER ENUCLEATION OF THE PROSTATE WITH MORCELLATION;  Surgeon: Hollice Espy, MD;  Location: [facility] ORS;  Service: Urology;  Laterality: N/A;  . OPEN SUBSCAPULARIS REPAIR Right 01/26/2016   Procedure: arthroscopic  SUBSCAPULARIS REPAIR;  Surgeon: Corky Mull, MD;  Location: [facility] ORS;  Service: Orthopedics;  Laterality: Right;  . SHOULDER ARTHROSCOPY WITH OPEN ROTATOR CUFF REPAIR Right 01/26/2016   Procedure: SHOULDER ARTHROSCOPY WITH MINI OPEN ROTATOR CUFF REPAIR;  Surgeon: Corky Mull, MD;  Location: [facility] ORS;  Service: Orthopedics;  Laterality: Right;  . SHOULDER ARTHROSCOPY WITH SUBACROMIAL DECOMPRESSION Right 01/26/2016   Procedure: SHOULDER ARTHROSCOPY WITH SUBACROMIAL DECOMPRESSION and debridement;  Surgeon: Corky Mull, MD;  Location: [facility] ORS;  Service: Orthopedics;  Laterality: Right;  . THROAT SURGERY     excision - cancer  . TONSILLECTOMY         Family History  Problem Relation Age of Onset  . Heart attack Father     Social History   Tobacco Use  . Smoking status: Never Smoker  . Smokeless tobacco: Never Used  Vaping Use  . Vaping Use: Never used  Substance Use Topics  . Alcohol use: No    Comment: rarely  . Drug use: No    Home Medications Prior to Admission medications   Medication Sig Start Date End Date Taking? Authorizing Provider  aspirin EC 81 MG tablet Take 81 mg by mouth daily. Swallow whole.    [provider]  levothyroxine (SYNTHROID, LEVOTHROID) 100 MCG tablet Take 100 mcg by mouth daily before breakfast.     [provider]  metFORMIN (GLUCOPHAGE) 500 MG tablet Place 500 mg into feeding tube 2 (two) times daily with a meal.    [provider]  metoprolol tartrate (LOPRESSOR) 25 MG tablet Take 12.5 mg by mouth 2 (two) times daily.     [provider]  simvastatin (ZOCOR) 40 MG tablet Take 40 mg by mouth daily.     [provider]  vitamin B-12 (CYANOCOBALAMIN) 1000 MCG tablet Take 1,000 mcg by mouth daily.    [provider]  XARELTO 20 MG TABS tablet  09/10/18   [provider]    Allergies    Tamsulosin hcl  Review of Systems   Review of Systems  Physical Exam Updated Vital Signs BP 108/82 (BP Location: Right Arm)   Pulse 75   Temp 97.8 F (36.6 C) (Oral)   Resp 18   Ht 5\' 9"  (1.753 m)   Wt 76.2 kg   SpO2 97%   BMI 24.81 kg/m   Physical Exam  ED Results / Procedures / Treatments   Labs (all labs ordered are listed, but only abnormal results are displayed) Labs Reviewed - No data to display  EKG None  Radiology DG ABDOMEN PEG TUBE LOCATION  Result Date: 05/15/2020 CLINICAL DATA:  Status post PEG tube placement EXAM: ABDOMEN - 1 VIEW COMPARISON:  10/16/2019  FINDINGS: Supine portable view of the abdomen. Gastrostomy tube balloon projects over the body of the stomach. Contrast opacifies the stomach and duodenum. No gross extravasation. IMPRESSION: Gastrostomy tube overlies the body of the stomach. No gross extravasation. Electronically Signed   By: Donavan Foil M.D.   On: 05/15/2020 20:28    Procedures Procedures   Medications Ordered in ED Medications - No data to display  ED Course  I have reviewed the triage vital signs and the nursing notes.  Pertinent labs & imaging results that were available during my care of the patient were reviewed by me and considered in my medical decision making (see chart for details).    MDM Rules/Calculators/A&P                          75 year old male with successful G-tube placement after previous G-tube fell out prior to arrival.  X-rays show gastrotomy to over the stomach with contrast seen in the stomach and duodenum.  Patient tolerated procedure well and is stable and ready for discharge to home. Final Clinical Impression(s) / ED Diagnoses Final diagnoses:  Malfunction of percutaneous endoscopic gastrostomy (PEG)  tube [facility])    Rx / DC Orders ED Discharge Orders    None       Renata Caprice 05/15/20 2037    Lavonia Drafts, MD 05/17/20 1511

## 2020-05-15 NOTE — ED Triage Notes (Signed)
Feeding tube fell out, needs replacement.  22Fr G-tube.

## 2020-05-15 NOTE — ED Notes (Signed)
Pt verbalized understanding of d/c isntrctions at this time. Pt given opportunity to ask questions as needed. Pt ambulatory to ED lobby, NAD noted, steady gait noted, RR even and unlabored at this time

## 2020-06-06 ENCOUNTER — Other Ambulatory Visit: Payer: Self-pay

## 2020-06-06 ENCOUNTER — Inpatient Hospital Stay
Admission: EM | Admit: 2020-06-06 | Discharge: 2020-06-24 | DRG: 207 | Disposition: E | Payer: Medicare Other | Attending: Pulmonary Disease | Admitting: Pulmonary Disease

## 2020-06-06 ENCOUNTER — Emergency Department: Payer: Medicare Other

## 2020-06-06 DIAGNOSIS — J9811 Atelectasis: Secondary | ICD-10-CM | POA: Diagnosis present

## 2020-06-06 DIAGNOSIS — A419 Sepsis, unspecified organism: Secondary | ICD-10-CM

## 2020-06-06 DIAGNOSIS — I480 Paroxysmal atrial fibrillation: Secondary | ICD-10-CM | POA: Diagnosis present

## 2020-06-06 DIAGNOSIS — E870 Hyperosmolality and hypernatremia: Secondary | ICD-10-CM | POA: Diagnosis present

## 2020-06-06 DIAGNOSIS — I482 Chronic atrial fibrillation, unspecified: Secondary | ICD-10-CM | POA: Diagnosis present

## 2020-06-06 DIAGNOSIS — I493 Ventricular premature depolarization: Secondary | ICD-10-CM | POA: Diagnosis present

## 2020-06-06 DIAGNOSIS — Z8546 Personal history of malignant neoplasm of prostate: Secondary | ICD-10-CM | POA: Diagnosis not present

## 2020-06-06 DIAGNOSIS — Z66 Do not resuscitate: Secondary | ICD-10-CM | POA: Diagnosis not present

## 2020-06-06 DIAGNOSIS — Z01818 Encounter for other preprocedural examination: Secondary | ICD-10-CM

## 2020-06-06 DIAGNOSIS — Z7984 Long term (current) use of oral hypoglycemic drugs: Secondary | ICD-10-CM

## 2020-06-06 DIAGNOSIS — J1282 Pneumonia due to coronavirus disease 2019: Secondary | ICD-10-CM | POA: Diagnosis present

## 2020-06-06 DIAGNOSIS — Z86006 Personal history of melanoma in-situ: Secondary | ICD-10-CM

## 2020-06-06 DIAGNOSIS — U071 COVID-19: Principal | ICD-10-CM

## 2020-06-06 DIAGNOSIS — I110 Hypertensive heart disease with heart failure: Secondary | ICD-10-CM | POA: Diagnosis present

## 2020-06-06 DIAGNOSIS — D649 Anemia, unspecified: Secondary | ICD-10-CM | POA: Diagnosis present

## 2020-06-06 DIAGNOSIS — R0902 Hypoxemia: Secondary | ICD-10-CM | POA: Diagnosis present

## 2020-06-06 DIAGNOSIS — R1313 Dysphagia, pharyngeal phase: Secondary | ICD-10-CM | POA: Diagnosis present

## 2020-06-06 DIAGNOSIS — Z515 Encounter for palliative care: Secondary | ICD-10-CM

## 2020-06-06 DIAGNOSIS — I5021 Acute systolic (congestive) heart failure: Secondary | ICD-10-CM | POA: Diagnosis present

## 2020-06-06 DIAGNOSIS — R569 Unspecified convulsions: Secondary | ICD-10-CM

## 2020-06-06 DIAGNOSIS — Z7901 Long term (current) use of anticoagulants: Secondary | ICD-10-CM

## 2020-06-06 DIAGNOSIS — Z931 Gastrostomy status: Secondary | ICD-10-CM

## 2020-06-06 DIAGNOSIS — Z888 Allergy status to other drugs, medicaments and biological substances status: Secondary | ICD-10-CM

## 2020-06-06 DIAGNOSIS — R531 Weakness: Secondary | ICD-10-CM

## 2020-06-06 DIAGNOSIS — I4891 Unspecified atrial fibrillation: Secondary | ICD-10-CM | POA: Diagnosis not present

## 2020-06-06 DIAGNOSIS — E782 Mixed hyperlipidemia: Secondary | ICD-10-CM | POA: Diagnosis present

## 2020-06-06 DIAGNOSIS — G931 Anoxic brain damage, not elsewhere classified: Secondary | ICD-10-CM | POA: Diagnosis present

## 2020-06-06 DIAGNOSIS — Z8249 Family history of ischemic heart disease and other diseases of the circulatory system: Secondary | ICD-10-CM

## 2020-06-06 DIAGNOSIS — Z85819 Personal history of malignant neoplasm of unspecified site of lip, oral cavity, and pharynx: Secondary | ICD-10-CM

## 2020-06-06 DIAGNOSIS — Z923 Personal history of irradiation: Secondary | ICD-10-CM

## 2020-06-06 DIAGNOSIS — R6521 Severe sepsis with septic shock: Secondary | ICD-10-CM | POA: Diagnosis not present

## 2020-06-06 DIAGNOSIS — R739 Hyperglycemia, unspecified: Secondary | ICD-10-CM | POA: Diagnosis present

## 2020-06-06 DIAGNOSIS — E039 Hypothyroidism, unspecified: Secondary | ICD-10-CM | POA: Diagnosis present

## 2020-06-06 DIAGNOSIS — J159 Unspecified bacterial pneumonia: Secondary | ICD-10-CM | POA: Diagnosis present

## 2020-06-06 DIAGNOSIS — E440 Moderate protein-calorie malnutrition: Secondary | ICD-10-CM | POA: Diagnosis present

## 2020-06-06 DIAGNOSIS — I471 Supraventricular tachycardia: Secondary | ICD-10-CM | POA: Diagnosis present

## 2020-06-06 DIAGNOSIS — L8990 Pressure ulcer of unspecified site, unspecified stage: Secondary | ICD-10-CM | POA: Insufficient documentation

## 2020-06-06 DIAGNOSIS — N179 Acute kidney failure, unspecified: Secondary | ICD-10-CM | POA: Diagnosis present

## 2020-06-06 DIAGNOSIS — J80 Acute respiratory distress syndrome: Secondary | ICD-10-CM | POA: Diagnosis present

## 2020-06-06 DIAGNOSIS — Z6825 Body mass index (BMI) 25.0-25.9, adult: Secondary | ICD-10-CM

## 2020-06-06 DIAGNOSIS — Z9221 Personal history of antineoplastic chemotherapy: Secondary | ICD-10-CM

## 2020-06-06 DIAGNOSIS — I252 Old myocardial infarction: Secondary | ICD-10-CM

## 2020-06-06 DIAGNOSIS — H9192 Unspecified hearing loss, left ear: Secondary | ICD-10-CM | POA: Diagnosis present

## 2020-06-06 DIAGNOSIS — I469 Cardiac arrest, cause unspecified: Secondary | ICD-10-CM | POA: Diagnosis not present

## 2020-06-06 DIAGNOSIS — R7303 Prediabetes: Secondary | ICD-10-CM | POA: Diagnosis present

## 2020-06-06 DIAGNOSIS — J9621 Acute and chronic respiratory failure with hypoxia: Secondary | ICD-10-CM

## 2020-06-06 DIAGNOSIS — J9600 Acute respiratory failure, unspecified whether with hypoxia or hypercapnia: Secondary | ICD-10-CM | POA: Diagnosis not present

## 2020-06-06 DIAGNOSIS — I255 Ischemic cardiomyopathy: Secondary | ICD-10-CM | POA: Diagnosis present

## 2020-06-06 DIAGNOSIS — Z951 Presence of aortocoronary bypass graft: Secondary | ICD-10-CM

## 2020-06-06 DIAGNOSIS — Z978 Presence of other specified devices: Secondary | ICD-10-CM

## 2020-06-06 DIAGNOSIS — R0602 Shortness of breath: Secondary | ICD-10-CM | POA: Diagnosis present

## 2020-06-06 DIAGNOSIS — Z79890 Hormone replacement therapy (postmenopausal): Secondary | ICD-10-CM

## 2020-06-06 DIAGNOSIS — N400 Enlarged prostate without lower urinary tract symptoms: Secondary | ICD-10-CM | POA: Diagnosis present

## 2020-06-06 DIAGNOSIS — T380X5A Adverse effect of glucocorticoids and synthetic analogues, initial encounter: Secondary | ICD-10-CM | POA: Diagnosis present

## 2020-06-06 DIAGNOSIS — I2510 Atherosclerotic heart disease of native coronary artery without angina pectoris: Secondary | ICD-10-CM | POA: Diagnosis present

## 2020-06-06 DIAGNOSIS — Z79899 Other long term (current) drug therapy: Secondary | ICD-10-CM

## 2020-06-06 LAB — BASIC METABOLIC PANEL
Anion gap: 13 (ref 5–15)
BUN: 48 mg/dL — ABNORMAL HIGH (ref 8–23)
CO2: 25 mmol/L (ref 22–32)
Calcium: 9.1 mg/dL (ref 8.9–10.3)
Chloride: 97 mmol/L — ABNORMAL LOW (ref 98–111)
Creatinine, Ser: 1.06 mg/dL (ref 0.61–1.24)
GFR, Estimated: >60 mL/min (ref >60)
Glucose, Bld: 213 mg/dL — ABNORMAL HIGH (ref 70–99)
Potassium: 4.2 mmol/L (ref 3.5–5.1)
Sodium: 135 mmol/L (ref 135–145)

## 2020-06-06 LAB — CBC WITH DIFFERENTIAL/PLATELET
Abs Immature Granulocytes: 0.01 10*3/uL (ref 0.00–0.07)
Basophils Absolute: 0 10*3/uL (ref 0.0–0.1)
Basophils Relative: 0 %
Eosinophils Absolute: 0 10*3/uL (ref 0.0–0.5)
Eosinophils Relative: 0 %
HCT: 32.1 % — ABNORMAL LOW (ref 39.0–52.0)
Hemoglobin: 10 g/dL — ABNORMAL LOW (ref 13.0–17.0)
Immature Granulocytes: 0 %
Lymphocytes Relative: 7 %
Lymphs Abs: 0.3 10*3/uL — ABNORMAL LOW (ref 0.7–4.0)
MCH: 27.3 pg (ref 26.0–34.0)
MCHC: 31.2 g/dL (ref 30.0–36.0)
MCV: 87.7 fL (ref 80.0–100.0)
Monocytes Absolute: 0.2 10*3/uL (ref 0.1–1.0)
Monocytes Relative: 5 %
Neutro Abs: 3.9 10*3/uL (ref 1.7–7.7)
Neutrophils Relative %: 88 %
Platelets: 144 10*3/uL — ABNORMAL LOW (ref 150–400)
RBC: 3.66 MIL/uL — ABNORMAL LOW (ref 4.22–5.81)
RDW: 19.6 % — ABNORMAL HIGH (ref 11.5–15.5)
Smear Review: NORMAL
WBC Morphology: INCREASED
WBC: 4.5 10*3/uL (ref 4.0–10.5)
nRBC: 0 % (ref 0.0–0.2)

## 2020-06-06 MED ORDER — AMIODARONE IV BOLUS ONLY 150 MG/100ML
150.0000 mg | Freq: Once | INTRAVENOUS | Status: AC
  Administered 2020-06-06: 150 mg via INTRAVENOUS
  Filled 2020-06-06: qty 100

## 2020-06-06 MED ORDER — ACETAMINOPHEN 325 MG PO TABS: 650.0000 mg | ORAL_TABLET | Freq: Four times a day (QID) | ORAL | Status: DC | PRN

## 2020-06-06 MED ORDER — ETOMIDATE 2 MG/ML IV SOLN
0.3000 mg/kg | Freq: Once | INTRAVENOUS | Status: AC
  Administered 2020-06-06: 22.8 mg via INTRAVENOUS
  Filled 2020-06-06: qty 20

## 2020-06-06 MED ORDER — ADENOSINE 6 MG/2ML IV SOLN
INTRAVENOUS | Status: AC
  Administered 2020-06-06: 6 mg via INTRAVENOUS
  Filled 2020-06-06: qty 6

## 2020-06-06 MED ORDER — DEXAMETHASONE 6 MG PO TABS
6.0000 mg | ORAL_TABLET | ORAL | Status: DC
Start: 2020-06-07 — End: 2020-06-07

## 2020-06-06 MED ORDER — AMIODARONE HCL IN DEXTROSE 360-4.14 MG/200ML-% IV SOLN
60.0000 mg/h | INTRAVENOUS | Status: AC
  Administered 2020-06-07: 60 mg/h via INTRAVENOUS
  Filled 2020-06-06: qty 200

## 2020-06-06 MED ORDER — DILTIAZEM HCL 25 MG/5ML IV SOLN
INTRAVENOUS | Status: AC
  Filled 2020-06-06: qty 5

## 2020-06-06 MED ORDER — ACETAMINOPHEN 650 MG RE SUPP: 650.0000 mg | Freq: Four times a day (QID) | RECTAL | Status: DC | PRN

## 2020-06-06 MED ORDER — SODIUM CHLORIDE 0.9 % IV SOLN: Freq: Once | INTRAVENOUS | Status: AC

## 2020-06-06 MED ORDER — AMIODARONE HCL IN DEXTROSE 360-4.14 MG/200ML-% IV SOLN
INTRAVENOUS | Status: AC
  Administered 2020-06-06: 60 mg/h via INTRAVENOUS
  Filled 2020-06-06: qty 200

## 2020-06-06 MED ORDER — DILTIAZEM HCL-DEXTROSE 125-5 MG/125ML-% IV SOLN (PREMIX)
5.0000 mg/h | INTRAVENOUS | Status: DC
  Administered 2020-06-06: 5 mg/h via INTRAVENOUS
  Filled 2020-06-06: qty 125

## 2020-06-06 MED ORDER — DILTIAZEM HCL 25 MG/5ML IV SOLN
15.0000 mg | Freq: Once | INTRAVENOUS | Status: AC
  Administered 2020-06-06: 15 mg via INTRAVENOUS

## 2020-06-06 MED ORDER — ADENOSINE 6 MG/2ML IV SOLN: 6.0000 mg | Freq: Once | INTRAVENOUS | Status: AC

## 2020-06-06 MED ORDER — DEXAMETHASONE SODIUM PHOSPHATE 10 MG/ML IJ SOLN
10.0000 mg | Freq: Once | INTRAMUSCULAR | Status: AC
  Administered 2020-06-06: 10 mg via INTRAVENOUS
  Filled 2020-06-06: qty 1

## 2020-06-06 MED ORDER — ADENOSINE 12 MG/4ML IV SOLN
12.0000 mg | Freq: Once | INTRAVENOUS | Status: AC
  Administered 2020-06-06: 12 mg via INTRAVENOUS

## 2020-06-06 MED ORDER — SODIUM CHLORIDE 0.9 % IV SOLN
100.0000 mg | Freq: Every day | INTRAVENOUS | Status: DC
  Administered 2020-06-07: 100 mg via INTRAVENOUS
  Filled 2020-06-06: qty 100
  Filled 2020-06-06: qty 20

## 2020-06-06 MED ORDER — ALBUTEROL SULFATE HFA 108 (90 BASE) MCG/ACT IN AERS
1.0000 | INHALATION_SPRAY | RESPIRATORY_TRACT | Status: DC | PRN
Start: 2020-06-06 — End: 2020-06-08
  Filled 2020-06-06: qty 6.7

## 2020-06-06 MED ORDER — SODIUM CHLORIDE 0.9 % IV SOLN
200.0000 mg | Freq: Once | INTRAVENOUS | Status: AC
  Administered 2020-06-06: 200 mg via INTRAVENOUS
  Filled 2020-06-06: qty 40

## 2020-06-06 NOTE — H&P (Signed)
History and Physical    PLEASE NOTE THAT [employer]AGON DICT[employer] SOFTWARE WAS USED IN THE CONSTRUCTION OF THIS NOTE.   FISHER HARGADON KAJ:[id number] DOB: 07/01/1945 DOA: 05/26/2020  PCP: Marinda Elk, MD Patient coming from: home   I have personally briefly reviewed patient's old medical records in [place]  Chief Complaint: Shortness of breath  HPI: Jermaine Campbell is a 75 y.o. 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   The patient reports 5 days of progressive shortness of breath associated with new onset nonproductive cough, subjective fever and chills in the absence of full body rigors.  In the setting of progression of the symptoms, he underwent outpatient testing at [facility] clinic for COVID-19 on 06/05/2020, which was found to be positive.  In the setting of subsequent further progression of the above symptoms, the patient presented to [facility] ED today for further evaluation.   He denies any associated orthopnea, PND, or new onset peripheral edema.  He reports mild intermittent palpitations over the last few days, but denies any associated chest pain, diaphoresis, nausea, vomiting, dizziness, presyncope, or syncope.  He also denies any associated wheezing, hemoptysis, new lower extremity erythema, or calf tenderness.  No recent trauma, travel, or known COVID-19 exposures.  Denies any recent melena or hematochezia.  Denies any associated recent headache rhinitis,  rhinorrhea, abdominal pain, diarrhea, or rash.  Not associate with any recent dysuria, gross hematuria, or change in urinary urgency/frequency.  He does however report generalized weakness over the last 4 to 5 days in the absence of any acute focal weakness, acute focal paresthesias, numbness, acute change in vision slurred speech, facial droop, dysphagia, or vertigo.  The patient confirms a documented history of paroxysmal atrial fibrillation for which he reports good compliance with his chronic anticoagulation on Xarelto.  Home AV nodal blocking regimen consists of Lopressor.  He denies any baseline supplemental oxygen requirements and denies any known chronic underlying pulmonary pathology.  He reports he is a lifelong non-smoker.  Denies formal diagnosis of diabetes, but reports diagnosis of prediabetes, for which he is on metformin.  Most recent hemoglobin A1c noted to be 5.9% when checked in November 2021.      ED Course:  Vital signs in the ED were notable for the following: Temperature max 99.0; initial heart rates were found to be in the 180s to 190s, with ensuing 2 doses of IV adenosine revealing apparent atrial fibrillation with RVR.  Initial blood pressure noted to be 106/93, although interval trend down to 83/64 prompted the EDP to consult the on-call cardiologist, Dr. Nehemiah Massed who recommended electrocardioversion in the setting.  Successful electrocardioversion performed in the ED, with ensuing rhythm remaining sinus.  Additionally, Dr. Nehemiah Massed recommended initiation of amiodarone IV bolus followed by 6 hours of continuous amiodarone infusion as well as diltiazem drip.  Ensuing blood pressure following electrocardioversion as well as amiodarone/diltiazem drip has been noted to be 114/81; additional vital signs of note include respiratory rate 27-35; initial oxygen saturation 87% on room air, which is also improved to 95 to 98% on 2 L nasal cannula  Labs were notable for the following: BMP  was notable for the following: Sodium 135, bicarbonate 25, BUN 48, creatinine 1.06 relative to most recent prior value 0.76 on 05/10/2020.  CBC notable for white blood cell count of 4500, hemoglobin 10.0 relative to most recent prior value of 9.7 when checked on 05/10/2020.  Initial EKG showed SVT with heart rate 195, with post electrocardioversion EKG showing sinus tachycardia with PVC and heart rate 115, with normal intervals, and no evidence of T wave or ST changes.  Chest x-ray showed bilateral airspace opacities, right greater than left, without associated consolidation with findings suggestive of multifocal pneumonia of atypical etiology, while showing no evidence of pulmonary edema, effusion, or pneumothorax.  While in the ED, the following were administered: In addition to the 2 doses of IV adenosine, the patient also received the following: Decadron 10 mg IV x1, diltiazem 15 mg IV x1, initiation of diltiazem drip, amiodarone 150 mg IV x1 followed by initiation of amiodarone drip with duration set to 6 additional hours per cardiology recommendations, remdesivir 200 mg IV x1.  Socially, the patient was admitted to the PCU for further evaluation management of acute hypoxic respiratory distress in the setting of severe COVID-19 pneumonia and complicated by initial atrial fibrillation with RVR.     Review of Systems: As per HPI otherwise 10 point review of systems negative.   Past Medical History:  Diagnosis Date  . Anemia   . Basal cell carcinoma 12/30/2019   L lower pretibia, EDC  . Bradycardia   . Cancer [facility]) 2005   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 s/p chemotherapy  . Cancer of prostate ([place]) 2019  . Coronary artery disease    atherosclerosis of native coronary artery of native heart without angina pectoris  . Deaf    Left ear  . Dysphagia    history of d/t throat cancer  . Dysrhythmia    paroxysmal atrial fibrillation, bradycardia  . H/O syncope    several  yrs ago  . History of BPH   . Hypercholesteremia   . Hypertension   . Hypothyroidism    s/p radiation tx - throat CA  . Melanoma ([place]) 06/11/2018   Melanoma In Situ Posterior neck  . Myocardial infarction ([place])    1996  . Neck stiffness    s/p radiation tx - Throat CA  . Seizures ([place])    last one 2 yrs ago. not medicated. d/t injury to frontal lobe  . Vertigo    no episodes 4-5 yrs/dizziness  . Wears hearing aid    Right ear/ deaf in left ear    Past Surgical History:  Procedure Laterality Date  . BICEPT TENODESIS Right 01/26/2016   Procedure: BICEPS TENODESIS- open;  Surgeon: Corky Mull, MD;  Location: [facility] ORS;  Service: Orthopedics;  Laterality: Right;  . CARDIAC CATHETERIZATION     before CABG/ DR PARACHOS IS CARDIOLOGIST  . CATARACT EXTRACTION W/PHACO Left 07/30/2014   Procedure: CATARACT EXTRACTION PHACO AND INTRAOCULAR LENS PLACEMENT ([place]) SHUGARCAINE;  Surgeon: Leandrew Koyanagi, MD;  Location: [place];  Service: Ophthalmology;  Laterality: Left;  SHUGARCAINE  . CATARACT EXTRACTION W/PHACO Right 10/29/2014   Procedure: CATARACT EXTRACTION PHACO AND INTRAOCULAR LENS PLACEMENT ([facility]);  Surgeon: Leandrew Koyanagi, MD;  Location: [place];  Service: Ophthalmology;  Laterality: Right;  [place]  . COLONOSCOPY    . COLONOSCOPY WITH PROPOFOL N/A 04/14/2017   Procedure: COLONOSCOPY WITH PROPOFOL;  Surgeon: Manya Silvas,  MD;  Location: [facility] ENDOSCOPY;  Service: Endoscopy;  Laterality: N/A;  . CORONARY ARTERY BYPASS GRAFT  1996   3 vessel  . EYE SURGERY Bilateral 2015   cataract extractions  . FRACTURE SURGERY     right arm. as a child  . HOLEP-LASER ENUCLEATION OF THE PROSTATE WITH MORCELLATION N/A 08/23/2017   Procedure: HOLEP-LASER ENUCLEATION OF THE PROSTATE WITH MORCELLATION;  Surgeon: Hollice Espy, MD;  Location: [facility] ORS;  Service: Urology;  Laterality: N/A;  . OPEN SUBSCAPULARIS REPAIR Right 01/26/2016   Procedure: arthroscopic   SUBSCAPULARIS REPAIR;  Surgeon: Corky Mull, MD;  Location: [facility] ORS;  Service: Orthopedics;  Laterality: Right;  . SHOULDER ARTHROSCOPY WITH OPEN ROTATOR CUFF REPAIR Right 01/26/2016   Procedure: SHOULDER ARTHROSCOPY WITH MINI OPEN ROTATOR CUFF REPAIR;  Surgeon: Corky Mull, MD;  Location: [facility] ORS;  Service: Orthopedics;  Laterality: Right;  . SHOULDER ARTHROSCOPY WITH SUBACROMIAL DECOMPRESSION Right 01/26/2016   Procedure: SHOULDER ARTHROSCOPY WITH SUBACROMIAL DECOMPRESSION and debridement;  Surgeon: Corky Mull, MD;  Location: [facility] ORS;  Service: Orthopedics;  Laterality: Right;  . THROAT SURGERY     excision - cancer  . TONSILLECTOMY      Social History:  reports that he has never smoked. He has never used smokeless tobacco. He reports that he does not drink alcohol and does not use drugs.   Allergies  Allergen Reactions  . Tamsulosin Hcl Other (See Comments)    Makes blood pressure drop low and pass out    Family History  Problem Relation Age of Onset  . Heart attack Father     Family history reviewed and not pertinent    Prior to Admission medications   Medication Sig Start Date End Date Taking? Authorizing Provider  aspirin EC 81 MG tablet Take 81 mg by mouth daily. Swallow whole.    [provider]  levothyroxine (SYNTHROID, LEVOTHROID) 100 MCG tablet Take 100 mcg by mouth daily before breakfast.     [provider]  metFORMIN (GLUCOPHAGE) 500 MG tablet Place 500 mg into feeding tube 2 (two) times daily with a meal.    [provider]  metoprolol tartrate (LOPRESSOR) 25 MG tablet Take 12.5 mg by mouth 2 (two) times daily.     [provider]  simvastatin (ZOCOR) 40 MG tablet Take 40 mg by mouth daily.     [provider]  vitamin B-12 (CYANOCOBALAMIN) 1000 MCG tablet Take 1,000 mcg by mouth daily.    [provider]  Alveda Reasons 20 MG TABS tablet  09/10/18   [provider]     Objective    Physical  Exam: Vitals:   06/17/2020 2300 06/08/2020 2305 06/09/2020 2310 06/17/2020 2315  BP: 129/82 114/81 104/83 103/73  Pulse: 65 67 82 85  Resp: (!) 32 (!) 28 (!) 35 (!) 30  Temp:      TempSrc:      SpO2: 100% 99% 98% 98%  Weight:      Height:        General: appears to be stated age; alert, oriented; slightly increased wob Skin: warm, dry, no rash Head:  AT/[place] Mouth:  Oral mucosa membranes appear dry, normal dentition Neck: supple; trachea midline Heart:  RRR; did not appreciate any M/R/G Lungs: CTAB, did not appreciate any wheezes, rales, or rhonchi Abdomen: + BS; soft, ND, NT; peg Vascular: 2+ pedal pulses b/l; 2+ radial pulses b/l Extremities: no peripheral edema, no muscle wasting Neuro: strength and sensation intact in upper and  lower extremities b/l    Labs on Admission: I have personally reviewed following labs and imaging studies  CBC: Recent Labs  Lab 06/20/2020 2120  WBC 4.5  NEUTROABS 3.9  HGB 10.0*  HCT 32.1*  MCV 87.7  PLT 956*   Basic Metabolic Panel: Recent Labs  Lab 05/30/2020 2120  NA 135  K 4.2  CL 97*  CO2 25  GLUCOSE 213*  BUN 48*  CREATININE 1.06  CALCIUM 9.1   GFR: Estimated Creatinine Clearance: 58.4 mL/min (by C-G formula based on SCr of 1.06 mg/dL). Liver Function Tests: No results for input(s): AST, ALT, ALKPHOS, BILITOT, PROT, ALBUMIN in the last 168 hours. No results for input(s): LIPASE, AMYLASE in the last 168 hours. No results for input(s): AMMONIA in the last 168 hours. Coagulation Profile: No results for input(s): INR, PROTIME in the last 168 hours. Cardiac Enzymes: No results for input(s): CKTOTAL, CKMB, CKMBINDEX, TROPONINI in the last 168 hours. BNP (last 3 results) No results for input(s): PROBNP in the last 8760 hours. HbA1C: No results for input(s): HGBA1C in the last 72 hours. CBG: No results for input(s): GLUCAP in the last 168 hours. Lipid Profile: No results for input(s): CHOL, HDL, LDLCALC, TRIG, CHOLHDL, LDLDIRECT in  the last 72 hours. Thyroid Function Tests: No results for input(s): TSH, T4TOTAL, FREET4, T3FREE, THYROIDAB in the last 72 hours. Anemia Panel: No results for input(s): VITAMINB12, FOLATE, FERRITIN, TIBC, IRON, RETICCTPCT in the last 72 hours. Urine analysis:    Component Value Date/Time   COLORURINE YELLOW 08/18/2017 1400   APPEARANCEUR Clear 10/05/2018 1351   LABSPEC 1.015 08/18/2017 1400   PHURINE 5.5 08/18/2017 1400   GLUCOSEU Negative 10/05/2018 1351   HGBUR SMALL (A) 08/18/2017 1400   BILIRUBINUR Negative 10/05/2018 1351   KETONESUR NEGATIVE 08/18/2017 1400   PROTEINUR Trace (A) 10/05/2018 1351   PROTEINUR 30 (A) 08/18/2017 1400   NITRITE Negative 10/05/2018 1351   NITRITE NEGATIVE 08/18/2017 1400   LEUKOCYTESUR Negative 10/05/2018 1351    Radiological Exams on Admission: DG Chest Portable 1 View  Result Date: 05/28/2020 CLINICAL DATA:  Shortness of breath.  COVID-19 positive EXAM: PORTABLE CHEST 1 VIEW COMPARISON:  February 13, 2007 FINDINGS: There is ill-defined airspace opacity in the right mid lung and both lung bases. Equivocal increased opacity left mid lung. Heart is upper normal in size with pulmonary vascularity normal. Patient is status post coronary artery bypass grafting. No adenopathy. There is aortic atherosclerosis. No bone lesions IMPRESSION: Ill-defined airspace opacity, more on the right than on the left without consolidation. Suspect multifocal pneumonia, likely of atypical organism etiology. A degree of mild underlying interstitial edema is possible. Status post coronary artery bypass grafting. Heart upper normal in size. Aortic Atherosclerosis (ICD10-I70.0). Electronically Signed   By: Lowella Grip III M.D.   On: 06/19/2020 21:52     EKG: Independently reviewed, with result as described above.    Assessment/Plan   Jermaine Campbell is a 75 y.o. 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    Principal Problem:   Pneumonia due to COVID-19 virus Active Problems:  Hyperlipidemia, mixed   Acquired hypothyroidism   Pharyngeal dysphagia   Atrial fibrillation with RVR (HCC)   Shortness of breath   Generalized weakness   AKI (acute kidney injury) ([place])    #) Severe COVID-19 pneumonia: diagnosis on the basis of: 5 days of progressive shortness of breath associate with new onset nonproductive cough, subjective fever, chills, with outpatient COVID-19 testing on 06/05/2020 found to be positive, and verifiable via review of results in care everywhere, while presenting chest x-ray suggestive of bilateral opacities suggestive of multifocal pneumonia of atypical etiology. Additionally, in the context of no known baseline supplemental O2 requirements, the patient is requiring 2 L nasal cannula in order to maintain O2 sats greater than or equal to 94%. In setting of this acute hypoxia, criteria are met from patient's COVID-19 infection to be considered severe in nature. Consequently, there is a Grade 2c rec for dexamethasone, which is further supported by treatment guidance recommendations from [place]'s Covid Treatment Guidelines.   Of note, in the setting of the patient's age greater than 48 as well as current cancer, this patient meets criteria to be considered high risk for a more complicated clinical course of COVID-19 infection, including increased probability for progression of the severity associated with this infection. Therefore, in  the setting of symptomatic COVID-19 infection requiring hospitalization for further evaluation and management thereof in this patient with the aforementioned high risk criteria who is felt to be early in the course of their infection given onset of respiratory symptoms starting less than 7 days ago, indications are met for initiation of remdesivir per treatment guidance recommendations from [place]'s Covid Treatment Guidelines. Of note, no documented history of transaminitis, and we will closely monitor ensuing ALT trend while receiving this medication.   Will closely monitor ensuing degree of hypoxia as well as associated trend in supplemental oxygen requirements. Does not appear to me indications for initiation of Tocilizumab at this time, as further described below. No known chronic underlying pulmonary pathology. Denies any known or suspected COVID-19 exposures.  Of note, patient denies any formal diagnosis of type 2 diabetes mellitus, or other notes history of prediabetes.  Consequently, it does not appear that criteria are met for initiation of daily linagliptin at this time.  We will add on and trend general inflammatory markers, as further detailed below.    Plan: Airborne and contact precautions. Monitor continuous pulse oximetry and monitor on telemetry. prn supplemental O2 to maintain O2 sats greater than or equal to 94%. Proning protocol initiated. PRN albuterol inhaler. PRN acetaminophen for fever. Start dexamethasone and remdesivir, as above. Check and trend inflammatory markers (fibrinogen, d dimer, crp, ferritin, LDH). Check serum magnesium and phosphorus levels. Check CMP and CBC in the morning. Consider checking blood gas for the purpose of evaluating PaO2 to FiO2 ratio. Flutter valve and incentive spirometry. If rapid progression of supplemental oxygen demand, development of need for high flow O2, or worsening hypoxemia with CRP > 10, would consider initiation of Tocilizumab at that  point. Check procalcitonin, which, if non-elevated in the context of the pro inflammatory state associated with COVID-19, would provide a high degree of negative predictive value that would further decrease the likelihood of any contribution from bacterial pneumonia.       #) Acute hypoxic respiratory distress: in the context of no known baseline supplemental oxygen requirements, presenting O2 sat noted to be 87% on room air, which is subsequently improved into the range of 95 to 98% on 2 L  nasal cannula, thereby meeting criteria for acute hypoxic respiratory distress as opposed to acute hypoxic respiratory failure at this time. Appears to be on the basis of COVID-19 infection, as above, with chest x-ray findings consistent with this process in the absence of any additional evidence of acute cardiopulmonary process, including no evidence of edema, effusion, or pneumothorax. No known chronic underlying pulmonary conditions.  Likely some contribution from initial atrial fibrillation with RVR, with subsequent successful electrocardioversion this evening, as further detailed above.  ACS is felt to be less likely at this time in the absence of any recent chest pain, with physiologic stress stemming from severe COVID-19 pneumonia and associated acute hypoxic respiratory distress suspected to service the provoking factor leading to atrial fibrillation with RVR.  While there is increased risk for acute PE in the setting of COVID-19 infection, clinically, this appears to be less likely at this time, and rendered even less likely given the patient's reported good compliance with chronic anticoagulation on Xarelto in the setting of history of paroxysmal atrial fibrillation.  If rapid progression of supplemental oxygen demand or if development of need for high flow O2, would consider initiation of Tocilizumab at that point. Will follow for result of procalcitonin level.     Plan: further evaluation and management of  presenting COVID-19 infection, as above, including monitoring of continuous pulse oximetry with prn supplemental O2 to maintain O2 sats greater than or equal to 94%. monitor on telemetry. Trending of inflammatory markers, as above. Check CMP and CBC in the morning. Check serum Mg and Phos levels. Flutter valve and incentive spirometry. PRN albuterol inhaler.  Dexamethasone and remdesivir, as above.        #) Atrial fibrillation with RVR: In the setting of a known history of paroxysmal atrial fibrillation, the patient presents today in atrial fibrillation with ventricular rates in the 180's - 190's, and rate / rhythm consistent  With a fib RVR after transient slowing of rate via 2 doses of IV adenosine, as further detailed above.  Soft blood pressures associated with these rates prompting consultation of on-call cardiology, Dr. Nehemiah Massed, with ensuing recommendations for ultimately successful electrocardioversion performed in the ED this evening as well as recommendations for post electrocardioversion initiation of IV amiodarone bolus followed by 6 additional hours of amiodarone drip as well as diltiazem drip, as further detailed above.  Blood pressures have improved following successful electrocardioversion, and remained normotensive amiodarone/diltiazem drips.   Suspect that exacerbation of atrial fibrillation with RVR was on the basis of physiologic stress stemming from presenting severe COVID-19 pneumonia as well as associated acute hypoxic respiratory distress, as further detailed above.  ACS appears to be less likely, particularly given the absence of any evidence of acute ischemic changes on post electrocardioversion EKG as well as patient's denial of any recent chest pain.  In the setting of a CHA2DS2-VASc score of 4, there is an indication for the patient to be on chronic anticoagulation for thromboembolic prophylaxis. Consistent with this, the patient is chronically anticoagulated on Xarelto. Home  AV nodal blocking regimen: Lopressor.     Plan: Monitor strict I's and O's and daily weights. Monitor on telemetry.  Monitor continuous pulse oximetry.  Further evaluation management of acute hypoxic respiratory distress in the setting of severe COVID-19 pneumonia, as above.  check serum magnesium level with prn supplementation to maintain levels of greater than or equal to 2.0. Repeat BMP in the AM.  Repeat CBC in the AM.  Cardiology formally consulted, as above.  Per cardiology recommendations, will continue amiodarone drip for 6 additional hours while also continuing diltiazem drip.  Continue home Xarelto.  Home Lopressor ordered to be resumed in the morning.  Given a history of acquired hypothyroidism, will also check TSH.        #) Generalized weakness: the patient reports 4-5 day duration of generalized weakness, in the absence of any evidence of acute focal neurologic deficits, including no evidence of acute focal weakness. Consequently, acute ischemic CVA is felt to be less likely at this time.  Suspect this is multifactorial, with contributions from physiologic stressors that include presenting acute hypoxic respiratory distress in setting of severe COVID-19 pneumonia as well as suspected consequential atrial fibrillation with RVR.   Plan: work-up and management of presenting acute hypoxic respiratory distress due to severe COVID-19 pneumonia as well as atrial fibrillation with RVR, as described above. Physical therapy consult has been placed for the morning.  Check TSH and MMA levels.        #) Acute kidney injury: Presenting serum creatinine found to be 1.06 relative to most recent prior value 0.76 on 05/10/2020.  This is prerenal in nature, with multifactorial contributions within this framework, including diminished renal perfusion as a result of both presenting acute hypoxic respiratory distress as well as initial hypotension that is subsequently resolved with successful  electrocardioversion of presenting atrial fibrillation with RVR.  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   Plan: I have placed an inpatient consult to the dietitian to assist with nutritional need evaluation as well as for assistance with PEG tube feeding.  Monitor strict I's and O's and daily weights.  Tempt avoid nephrotoxic agents.  Check urinalysis with microscopy, including attention for the presence of urinary gas.  Add on random urine sodium as well as random urine creatinine.  Further evaluation management presenting acute hypoxic respiratory distress due to severe COVID-19 pneumonia, as above.  Repeat BMP in the morning.       #) Acquired hypothyroidism: On Synthroid as an outpatient.  However, current home medication list includes 2 different doses of this medication.  Particularly in the setting of presenting atrial fibrillation with RVR, and refraining from initiation of his Synthroid medication pending clarification from inpatient pharmacy consult as to the accurate dose of his home thyroid supplementation.  Plan: Inpatient pharmacy consult placed for assistance with quantification of current home dose of Synthroid.  Pending result of this consultation, I am holding home Synthroid for now, as above.  Check TSH.       #) Hyperlipidemia: On simvastatin as an outpatient.  Plan: Continue home statin.      #) Chronic anemia: The patient has a history of chronic normocytic anemia, with baseline hemoglobin of 10, with suspected contribution from anemia of chronic disease.  Of note, presenting hemoglobin of 10 consistent with baseline, and relative to most recent prior hemoglobin of 9.7 when checked on 05/10/2020.  No evidence of active bleeding at this  time.  Plan: Repeat CBC in the morning.     DVT prophylaxis: Continue home Xarelto Code Status: Per my discussions with the patient this evening, he wishes to be full code Family Communication: none Disposition Plan: Per Rounding Team Consults called: On-call cardiology, Dr. Nehemiah Massed, was consulted as further described above. Admission status: Inpatient; PCU  Of note, this patient was added by me to the following Admit List/Treatment Team: [facility].      PLEASE NOTE THAT DRAGON DICTATION SOFTWARE WAS USED IN THE CONSTRUCTION OF THIS NOTE.   [facility] Pager [phone] From [place]  Otherwise, please contact night-coverage  [email or web address] Password [facility]   06/19/2020, 11:39 PM

## 2020-06-06 NOTE — ED Notes (Signed)
EKG performed. Pt placed on 15L NRB.

## 2020-06-06 NOTE — ED Notes (Signed)
Archie Balboa MD at bedside. Suction and ambubag at bedside. Consent form signed. Procedural timeout complete.

## 2020-06-06 NOTE — ED Notes (Signed)
Verbal order to bouls NS at 979ml/hr per Archie Balboa MD.

## 2020-06-06 NOTE — ED Triage Notes (Signed)
Pt from home via [facility]. Pt tested positive for covid on Friday. RA 88 percent. Pt on 4l with EMS at 94 percent. Pt reports weakness, NVD. EMS reports afib RVR from 90-200 en route. Denies CP. Alert and oriented x4.

## 2020-06-06 NOTE — ED Notes (Signed)
Pt connected to zoll pads. Archie Balboa MD at bedside.

## 2020-06-06 NOTE — ED Notes (Addendum)
Strip recorded via zoll. No conversion to NSR with adenosine.

## 2020-06-06 NOTE — ED Provider Notes (Signed)
[facility] Emergency Department Provider Note  __________________________________________   I have reviewed the triage vital signs and the nursing notes.   HISTORY  Chief Complaint Weakness and COVID +   History limited by: Not Limited   HPI Jermaine Campbell is a 75 y.o. male who presents to the emergency department today because of concern for weakness and shortness of breath. The patient states that he has been feeling poorly for the past 5 days. He has had fevers and sore throat. He also has had a cough. Went to [facility] clinic yesterday and tested positive for COVID. Continues to feel poorly today. When EMS arrived he was noted to be hypoxic on room air.   Records reviewed. Per medical record review patient has a history of CAD, atrial fibrillation, HTN.   Past Medical History:  Diagnosis Date  . Anemia   . Basal cell carcinoma 12/30/2019   L lower pretibia, EDC  . Bradycardia   . 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 Cancer of prostate ([place]) 2019  . Coronary artery disease    atherosclerosis of native coronary artery of native heart without angina pectoris  . Deaf    Left ear  . Dysphagia    history of d/t throat cancer  . Dysrhythmia    paroxysmal atrial fibrillation, bradycardia  . H/O syncope    several yrs ago  . History of BPH   . Hypercholesteremia   . Hypertension   . Hypothyroidism    s/p radiation tx - throat CA  . Melanoma ([place]) 06/11/2018   Melanoma In Situ Posterior neck  . Myocardial infarction ([place])    1996  . Neck stiffness    s/p radiation tx - Throat CA  . Seizures ([place])    last one 2 yrs ago. not medicated. d/t injury to frontal lobe  . Vertigo    no episodes 4-5 yrs/dizziness  . Wears hearing aid    Right ear/ deaf in left ear    Patient Active Problem List   Diagnosis Date Noted  . Erectile dysfunction due to arterial insufficiency 11/23/2019  .  Bradycardia 09/14/2018  . Status post right rotator cuff repair 07/16/2018  . 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 Nonrheumatic aortic valve stenosis 11/23/2017  . Prostate cancer ([place]) 08/08/2017  . Pharyngeal dysphagia 06/28/2017  . Paroxysmal atrial fibrillation ([place]) 01/04/2017  . Elevated PSA 12/28/2016  . BPH with obstruction/lower urinary tract symptoms 12/28/2016  . Oral lesion 06/08/2016  . Carpal tunnel syndrome of right wrist 05/06/2016  . Complete tear of right rotator cuff 12/04/2015  . Rotator cuff tendinitis, right 12/04/2015  . Spondylosis of cervical region without myelopathy or radiculopathy 12/04/2015  . Benign essential hypertension 08/05/2013  . CAD (coronary artery disease), native coronary artery 08/05/2013  . Hearing loss 08/05/2013  . Other specified cardiac arrhythmias 08/05/2013  . Hyperlipidemia, mixed 11/14/2012  . Increased frequency of urination 11/14/2012  . 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 Nocturia 11/14/2012  . 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 Status post chemoradiation 11/14/2012  . Hypothyroidism 01/09/2012    Past Surgical History:  Procedure Laterality Date  . BICEPT TENODESIS Right 01/26/2016   Procedure: BICEPS TENODESIS- open;  Surgeon: Corky Mull, MD;  Location: [facility] ORS;  Service: Orthopedics;  Laterality: Right;  .  CARDIAC CATHETERIZATION     before CABG/ DR PARACHOS IS CARDIOLOGIST  . CATARACT EXTRACTION W/PHACO Left 07/30/2014   Procedure: CATARACT EXTRACTION PHACO AND INTRAOCULAR LENS PLACEMENT ([place]) SHUGARCAINE;  Surgeon: Leandrew Koyanagi, MD;  Location: [place];  Service: Ophthalmology;  Laterality: Left;  SHUGARCAINE  . CATARACT EXTRACTION W/PHACO Right 10/29/2014   Procedure: CATARACT EXTRACTION PHACO AND INTRAOCULAR LENS PLACEMENT (IOC);  Surgeon: Leandrew Koyanagi, MD;  Location: [place];  Service: Ophthalmology;  Laterality: Right;  [place]  . COLONOSCOPY    . COLONOSCOPY WITH PROPOFOL N/A 04/14/2017   Procedure: COLONOSCOPY WITH PROPOFOL;  Surgeon: Manya Silvas, MD;  Location[facility] ENDOSCOPY;  Service: Endoscopy;  Laterality: N/A;  . CORONARY ARTERY BYPASS GRAFT  1996   3 vessel  . EYE SURGERY Bilateral 2015   cataract extractions  . FRACTURE SURGERY     right arm. as a child  . HOLEP-LASER ENUCLEATION OF THE PROSTATE WITH MORCELLATION N/A 08/23/2017   Procedure: HOLEP-LASER ENUCLEATION OF THE PROSTATE WITH MORCELLATION;  Surgeon: Hollice Espy, MD;  Location: [facility] ORS;  Service: Urology;  Laterality: N/A;  . OPEN SUBSCAPULARIS REPAIR Right 01/26/2016   Procedure: arthroscopic  SUBSCAPULARIS REPAIR;  Surgeon: Corky Mull, MD;  Location: [facility] ORS;  Service: Orthopedics;  Laterality: Right;  . SHOULDER ARTHROSCOPY WITH OPEN ROTATOR CUFF REPAIR Right 01/26/2016   Procedure: SHOULDER ARTHROSCOPY WITH MINI OPEN ROTATOR CUFF REPAIR;  Surgeon: Corky Mull, MD;  Location: [facility] ORS;  Service: Orthopedics;  Laterality: Right;  . SHOULDER ARTHROSCOPY WITH SUBACROMIAL DECOMPRESSION Right 01/26/2016   Procedure: SHOULDER ARTHROSCOPY WITH SUBACROMIAL DECOMPRESSION and debridement;  Surgeon: Corky Mull, MD;  Location: [facility] ORS;  Service: Orthopedics;  Laterality: Right;  . THROAT SURGERY     excision - cancer  . TONSILLECTOMY      Prior to Admission medications   Medication Sig Start Date End Date Taking? Authorizing Provider  aspirin EC 81 MG tablet Take 81 mg by mouth daily. Swallow whole.    [provider]  levothyroxine (SYNTHROID, LEVOTHROID) 100 MCG tablet Take 100 mcg by mouth daily before breakfast.     [provider]  metFORMIN (GLUCOPHAGE) 500 MG tablet Place 500 mg into feeding tube 2 (two) times daily with a meal.    [provider]  metoprolol tartrate (LOPRESSOR) 25 MG tablet Take 12.5 mg  by mouth 2 (two) times daily.     [provider]  simvastatin (ZOCOR) 40 MG tablet Take 40 mg by mouth daily.     [provider]  vitamin B-12 (CYANOCOBALAMIN) 1000 MCG tablet Take 1,000 mcg by mouth daily.    [provider]  XARELTO 20 MG TABS tablet  09/10/18   [provider]    Allergies Tamsulosin hcl  Family History  Problem Relation Age of Onset  . Heart attack Father     Social History Social History   Tobacco Use  . Smoking status: Never Smoker  . Smokeless tobacco: Never Used  Vaping Use  . Vaping Use: Never used  Substance Use Topics  . Alcohol use: No    Comment: rarely  . Drug use: No    Review of Systems Constitutional: Positive for fevers.  Eyes: No visual changes. ENT: Positive for sore throat. Cardiovascular: Denies chest pain. Respiratory: Positive for cough and shortness of breath. Gastrointestinal: No abdominal pain.  Genitourinary: Negative for dysuria. Musculoskeletal: Negative for back pain. Skin: Negative for rash. Neurological: Negative for headaches, focal weakness or numbness.  ____________________________________________   PHYSICAL EXAM:  VITAL SIGNS: ED Triage Vitals  Enc Vitals Group     BP 06/04/2020 2055 (!) 158/127     Pulse Rate 06/19/2020 2055 (!) 195     Resp 06/13/2020 2055 (!) 36     Temp 05/28/2020 2055 99 F (37.2 C)     Temp Source 05/27/2020 2055 Oral     SpO2 06/20/2020 2054 95 %     Weight 06/09/2020 2055 167 lb 8.8 oz (76 kg)     Height 06/08/2020 2055 5\' 9"  (1.753 m)     Head Circumference --      Peak Flow --      Pain Score 06/02/2020 2055 0   Constitutional: Alert and oriented.  Eyes: Conjunctivae are normal.  ENT      Head: Normocephalic and atraumatic.      Nose: No congestion/rhinnorhea.      Mouth/Throat: Mucous membranes are moist.      Neck: No stridor. Hematological/Lymphatic/Immunilogical: No cervical lymphadenopathy. Cardiovascular: Tachycardia, irregular rhythm.  No  murmurs, rubs, or gallops.  Respiratory: Slightly increased respiratory effort. Rhonchi noted in bilateral lungs.  Gastrointestinal: Soft and non tender. No rebound. No guarding.  Genitourinary: Deferred Musculoskeletal: Normal range of motion in all extremities. No lower extremity edema. Neurologic:  Normal speech and language. No gross focal neurologic deficits are appreciated.  Skin:  Skin is warm, dry and intact. No rash noted. Psychiatric: Mood and affect are normal. Speech and behavior are normal. Patient exhibits appropriate insight and judgment.  ____________________________________________    LABS (pertinent positives/negatives)  BMP wnl except cl 97, glu 213, bun 48 CBC wbc 4.5, hgb 10.0, plt 144  ____________________________________________   EKG  I, Nance Pear, attending physician, personally viewed and interpreted this EKG  EKG Time: 2058 Rate: 195 Rhythm: SVT vs afib with rvr Axis: normal Intervals: qtc 484 QRS: narrow ST changes: no st elevation Impression: abnormal ekg  ____________________________________________    RADIOLOGY  CXR Airspace opacities likely atypical multifocal pneumona   ____________________________________________   PROCEDURES  Procedures  CRITICAL CARE Performed by: Nance Pear   Total critical care time: 40 minutes  Critical care time was exclusive of separately billable procedures and treating other patients.  Critical care was necessary to treat or prevent imminent or life-threatening deterioration.  Critical care was time spent personally by me on the following activities: development of treatment plan with patient and/or surrogate as well as nursing, discussions with consultants, evaluation of patient's response to treatment, examination of patient, obtaining history from patient or surrogate, ordering and performing treatments and interventions, ordering and review of laboratory studies, ordering and review  of radiographic studies, pulse oximetry and re-evaluation of patient's condition.  ELECTRIC CARDIOVERSION Performed by: Nance Pear Consent:  The procedure was performed in an emergent situation.  Verbal consent was obtained.  Written consent was obtained. Risks and benefits: risks, benefits and alternatives were discussed Consent given by: patient Patient understanding: patient states understanding of the procedure being performed Patient consent: the patient's understanding of the procedure matches consent given Required items: required blood products, implants, devices, and special equipment available Patient identity confirmed: with wristband Patient sedated: yes Sedation type: moderate (conscious) sedation Sedatives: etomidate Cardioversion basis: emergent Pre-procedure rhythm: atrial fibrillation with RVR Patient position: patient was placed in a supine position Chest area: chest area exposed Electrodes: pads Electrodes placed: anterior-posterior Number of attempts: 1 Attempt 1 mode: synchronous Attempt 1 waveform: biphasic Attempt 1 shock (in Joules): 120 Attempt 1 outcome:  conversion to normal sinus rhythm with frequent PACs Post-procedure rhythm: normal sinus rhythm with frequent PACs Complications: no complications Patient tolerance: Patient tolerated the procedure well with no immediate complications   ____________________________________________   INITIAL IMPRESSION / ASSESSMENT AND PLAN / ED COURSE  Pertinent labs & imaging results that were available during my care of the patient were reviewed by me and considered in my medical decision making (see chart for details).   Patient presents to the emergency department today because of concerns for feeling unwell in the setting of her recent COVID diagnosis.  Patient did have concerns for shortness of breath and was found to be hypoxic.  Because of this he was placed on nasal cannula.  This did help his  oxygenation.  Additionally patient was found to be significantly tachycardic.  Initially unclear if the tachycardia was related to SVT versus A. fib with RVR.  He was given adenosine which did slow the rate down and it did appear to be A. fib.  Because of this he was then given a dose of diltiazem.  This did not significantly improve his heart rate.  He was then put on an infusion of diltiazem again without any significant improvement.  Patient states he has been taking his Xarelto.  Thinks he might have missed 1 dose in the past 3 weeks.  Discussed with Dr. Nehemiah Massed with cardiology.  He recommended bolus of amnio followed by electrical cardioversion.  I discussed this with the patient consented for both sedation and electrical cardioversion.  Patient did give consent and the procedure was performed.  Post procedure the patient's heart rate did improve dramatically.  I do think he was now in a sinus rhythm with frequent PACs.  Heart rate stayed in the 90s.  He was then started on an amnio infusion.  Will plan on admission to the hospital service.  ____________________________________________   FINAL CLINICAL IMPRESSION(S) / ED DIAGNOSES  Final diagnoses:  COVID-19  Hypoxia  Atrial fibrillation with RVR ([place])     Note: This dictation was prepared with Dragon dictation. Any transcriptional errors that result from this process are unintentional     Nance Pear, MD 06/07/20 0002

## 2020-06-06 NOTE — ED Notes (Signed)
Wife updated via phone on pt condition.

## 2020-06-06 NOTE — ED Notes (Signed)
120J synchronized cardioversion.

## 2020-06-06 NOTE — ED Notes (Signed)
Pt awake and talking. Pt oriented x4. NRB removed. Pt 97 percent on 6L [place].

## 2020-06-07 ENCOUNTER — Encounter: Payer: Self-pay | Admitting: Internal Medicine

## 2020-06-07 DIAGNOSIS — R0602 Shortness of breath: Secondary | ICD-10-CM | POA: Diagnosis present

## 2020-06-07 DIAGNOSIS — U071 COVID-19: Secondary | ICD-10-CM | POA: Diagnosis not present

## 2020-06-07 DIAGNOSIS — I4891 Unspecified atrial fibrillation: Secondary | ICD-10-CM | POA: Diagnosis present

## 2020-06-07 DIAGNOSIS — J1282 Pneumonia due to coronavirus disease 2019: Secondary | ICD-10-CM | POA: Diagnosis not present

## 2020-06-07 DIAGNOSIS — R531 Weakness: Secondary | ICD-10-CM

## 2020-06-07 DIAGNOSIS — N179 Acute kidney failure, unspecified: Secondary | ICD-10-CM | POA: Diagnosis present

## 2020-06-07 LAB — CBC WITH DIFFERENTIAL/PLATELET
Abs Immature Granulocytes: 0.02 10*3/uL (ref 0.00–0.07)
Basophils Absolute: 0 10*3/uL (ref 0.0–0.1)
Basophils Relative: 0 %
Eosinophils Absolute: 0 10*3/uL (ref 0.0–0.5)
Eosinophils Relative: 0 %
HCT: 26.3 % — ABNORMAL LOW (ref 39.0–52.0)
Hemoglobin: 8.2 g/dL — ABNORMAL LOW (ref 13.0–17.0)
Immature Granulocytes: 0 %
Lymphocytes Relative: 5 %
Lymphs Abs: 0.3 10*3/uL — ABNORMAL LOW (ref 0.7–4.0)
MCH: 27.4 pg (ref 26.0–34.0)
MCHC: 31.2 g/dL (ref 30.0–36.0)
MCV: 88 fL (ref 80.0–100.0)
Monocytes Absolute: 0.2 10*3/uL (ref 0.1–1.0)
Monocytes Relative: 4 %
Neutro Abs: 4.7 10*3/uL (ref 1.7–7.7)
Neutrophils Relative %: 91 %
Platelets: 103 10*3/uL — ABNORMAL LOW (ref 150–400)
RBC: 2.99 MIL/uL — ABNORMAL LOW (ref 4.22–5.81)
RDW: 19.7 % — ABNORMAL HIGH (ref 11.5–15.5)
WBC: 5.2 10*3/uL (ref 4.0–10.5)
nRBC: 0 % (ref 0.0–0.2)

## 2020-06-07 LAB — COMPREHENSIVE METABOLIC PANEL
ALT: 18 U/L (ref 0–44)
AST: 41 U/L (ref 15–41)
Albumin: 2.6 g/dL — ABNORMAL LOW (ref 3.5–5.0)
Alkaline Phosphatase: 51 U/L (ref 38–126)
Anion gap: 11 (ref 5–15)
BUN: 42 mg/dL — ABNORMAL HIGH (ref 8–23)
CO2: 24 mmol/L (ref 22–32)
Calcium: 8.1 mg/dL — ABNORMAL LOW (ref 8.9–10.3)
Chloride: 102 mmol/L (ref 98–111)
Creatinine, Ser: 0.89 mg/dL (ref 0.61–1.24)
GFR, Estimated: >60 mL/min (ref >60)
Glucose, Bld: 229 mg/dL — ABNORMAL HIGH (ref 70–99)
Potassium: 4.4 mmol/L (ref 3.5–5.1)
Sodium: 137 mmol/L (ref 135–145)
Total Bilirubin: 0.5 mg/dL (ref 0.3–1.2)
Total Protein: 6.7 g/dL (ref 6.5–8.1)

## 2020-06-07 LAB — PROCALCITONIN: Procalcitonin: 1.1 ng/mL

## 2020-06-07 LAB — LACTATE DEHYDROGENASE
LDH: 182 U/L (ref 98–192)
LDH: 173 U/L (ref 98–192)

## 2020-06-07 LAB — URINALYSIS, COMPLETE (UACMP) WITH MICROSCOPIC
Bacteria, UA: NONE SEEN
Bilirubin Urine: NEGATIVE
Glucose, UA: >=500 mg/dL — AB
Hgb urine dipstick: NEGATIVE
Ketones, ur: NEGATIVE mg/dL
Leukocytes,Ua: NEGATIVE
Nitrite: NEGATIVE
Protein, ur: 100 mg/dL — AB
Specific Gravity, Urine: 1.023 (ref 1.005–1.030)
pH: 5 (ref 5.0–8.0)

## 2020-06-07 LAB — CREATININE, URINE, RANDOM: Creatinine, Urine: 101 mg/dL

## 2020-06-07 LAB — D-DIMER, QUANTITATIVE
D-Dimer, Quant: 2.21 ug/mL-FEU — ABNORMAL HIGH (ref 0.00–0.50)
D-Dimer, Quant: 1.9 ug/mL-FEU — ABNORMAL HIGH (ref 0.00–0.50)

## 2020-06-07 LAB — C-REACTIVE PROTEIN
CRP: 29.4 mg/dL — ABNORMAL HIGH (ref <1.0)
CRP: 24.2 mg/dL — ABNORMAL HIGH (ref <1.0)

## 2020-06-07 LAB — FERRITIN
Ferritin: 114 ng/mL (ref 24–336)
Ferritin: 100 ng/mL (ref 24–336)

## 2020-06-07 LAB — FIBRINOGEN
Fibrinogen: >800 mg/dL — ABNORMAL HIGH (ref 210–475)
Fibrinogen: 749 mg/dL — ABNORMAL HIGH (ref 210–475)

## 2020-06-07 LAB — MAGNESIUM
Magnesium: 2.2 mg/dL (ref 1.7–2.4)
Magnesium: 1.9 mg/dL (ref 1.7–2.4)

## 2020-06-07 LAB — PHOSPHORUS: Phosphorus: 2.6 mg/dL (ref 2.5–4.6)

## 2020-06-07 LAB — TSH: TSH: 3.091 u[IU]/mL (ref 0.350–4.500)

## 2020-06-07 LAB — SODIUM, URINE, RANDOM: Sodium, Ur: <10 mmol/L

## 2020-06-07 MED ORDER — PANTOPRAZOLE SODIUM 40 MG IV SOLR
40.0000 mg | INTRAVENOUS | Status: DC
  Administered 2020-06-07 – 2020-06-14 (×8): 40 mg via INTRAVENOUS
  Filled 2020-06-07 (×8): qty 40

## 2020-06-07 MED ORDER — METOPROLOL TARTRATE 25 MG PO TABS
12.5000 mg | ORAL_TABLET | Freq: Two times a day (BID) | ORAL | Status: DC
  Administered 2020-06-07 (×2): 12.5 mg via ORAL
  Filled 2020-06-07 (×2): qty 1

## 2020-06-07 MED ORDER — OSMOLITE 1.5 CAL PO LIQD
474.0000 mL | Freq: Three times a day (TID) | ORAL | Status: DC
  Administered 2020-06-07 (×2): 474 mL

## 2020-06-07 MED ORDER — DEXAMETHASONE 6 MG PO TABS
6.0000 mg | ORAL_TABLET | ORAL | Status: DC
  Administered 2020-06-07: 6 mg via ORAL
  Filled 2020-06-07 (×2): qty 1

## 2020-06-07 MED ORDER — RIVAROXABAN 20 MG PO TABS
20.0000 mg | ORAL_TABLET | Freq: Every day | ORAL | Status: DC
  Administered 2020-06-07: 20 mg
  Filled 2020-06-07 (×2): qty 1

## 2020-06-07 MED ORDER — LEVOTHYROXINE SODIUM 50 MCG PO TABS
125.0000 ug | ORAL_TABLET | Freq: Every day | ORAL | Status: DC
  Administered 2020-06-07: 125 ug via ORAL
  Filled 2020-06-07: qty 3

## 2020-06-07 MED ORDER — OSMOLITE 1.5 CAL PO LIQD: 16.0000 mL | Freq: Three times a day (TID) | ORAL | Status: DC

## 2020-06-07 MED ORDER — SIMVASTATIN 20 MG PO TABS
40.0000 mg | ORAL_TABLET | Freq: Every day | ORAL | Status: DC
  Administered 2020-06-07: 40 mg via ORAL
  Filled 2020-06-07: qty 4

## 2020-06-07 MED ORDER — OSMOLITE 1.2 CAL PO LIQD: 16.0000 mL | Freq: Three times a day (TID) | ORAL | Status: DC

## 2020-06-07 MED ORDER — AMIODARONE HCL IN DEXTROSE 360-4.14 MG/200ML-% IV SOLN
60.0000 mg/h | INTRAVENOUS | Status: DC
  Administered 2020-06-07 – 2020-06-10 (×8): 30 mg/h via INTRAVENOUS
  Administered 2020-06-10 – 2020-06-14 (×15): 60 mg/h via INTRAVENOUS
  Filled 2020-06-07 (×23): qty 200

## 2020-06-07 MED ORDER — SODIUM CHLORIDE 0.9 % IV SOLN: Freq: Once | INTRAVENOUS | Status: AC

## 2020-06-07 NOTE — ED Notes (Signed)
Orders were in incorrectly-per protocol, amiodarone has been running at 30mg /hr since 0500

## 2020-06-07 NOTE — Progress Notes (Signed)
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  Upon presentation to [facility] ED vital signs notable for initial oxygen saturation 88% on room air in the context of no baseline supplemental oxygen requirements, with improvement into the mid 90s on 2 L nasal cannula.  Additionally, initial heart rates in the 180s to 190s, with ensuing 2 doses of IV adenosine demonstrating findings consistent with atrial fibrillation with RVR.  Case was discussed with the on-call cardiologist, Dr. Nehemiah Massed, in the setting of borderline hypotension, recommended electrocardioversion, which was successfully completed with patient now in sinus rhythm. Dr. Alveria Apley ensuing additional recommendations included initiation of amiodarone drip as well as continuation of diltiazem drip.  Systolic blood pressures now greater 100 following electrocardioversion.   In setting of severe COVID-19 pneumonia, the patient has been started on remdesivir and Decadron.     Babs Bertin, DO Hospitalist

## 2020-06-07 NOTE — Progress Notes (Signed)
PROGRESS NOTE    Jermaine Campbell  STM:[id number] DOB: [birth year] DOA: 04-Jul-2020 PCP: Marinda Elk, MD    Brief Narrative:  Jermaine Campbell is a 75 y.o. 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 The patient reports 5 days of progressive shortness of breath associated with new onset nonproductive cough, subjective fever and chills in the absence of full body rigors.  In the setting of progression of the symptoms, he underwent outpatient testing at [facility] clinic for COVID-19 on 06/05/2020, which was found to be positive.  In the setting of subsequent further progression of the above symptoms, the patient presented to [facility] ED today for further evaluation.   Initial EKG showed SVT with heart rate 195, with post electrocardioversion EKG showing sinus tachycardia with PVC and heart rate 115, with normal intervals, and no evidence of T wave or ST changes.  Chest x-ray showed bilateral airspace opacities, right greater than left, without associated consolidation with findings suggestive of multifocal pneumonia of atypical etiology, while showing no evidence of pulmonary edema, effusion, or pneumothorax.  5/15-HR better. States less sob but currently at rest. Unsure DOE. No cp O2 sat 97% on 3 L at rest   Consultants:   cardiology  Procedures:   Antimicrobials:   Remdesivir   Subjective: No chest pain, dizziness, abdominal pain  Objective: Vitals:   06/07/20 1100 06/07/20  1115 06/07/20 1130 06/07/20 1145  BP: 115/79 116/82  107/82  Pulse: 94 71 (!) 39 67  Resp: 18 18  18   Temp:      TempSrc:      SpO2:    94%  Weight:      Height:        Intake/Output Summary (Last 24 hours) at 06/07/2020 1513 Last data filed at 06/07/2020 0446 Gross per 24 hour  Intake --  Output 350 ml  Net -350 ml   Filed Weights   Jul 04, 2020 2055  Weight: 76 kg    Examination:  General exam: Appears calm and comfortable  Respiratory system: +crackles scattered. No w/r Cardiovascular system: S1 & S2 heard, RRR. No JVD, murmurs, rubs, gallops or clicks. . Gastrointestinal system: Abdomen is nondistended, soft and nontender. . Normal bowel sounds heard. Central nervous system: Alert and oriented. Grossly intact Extremities: no edema Skin: Warm dry Psychiatry:  Mood & affect appropriate in current setting.     Data Reviewed: I have personally reviewed following labs and imaging studies  CBC: Recent Labs  Lab 04-Jul-2020 2120 06/07/20 0423  WBC 4.5 5.2  NEUTROABS 3.9 4.7  HGB 10.0* 8.2*  HCT 32.1* 26.3*  MCV 87.7 88.0  PLT 144* 892*   Basic Metabolic Panel: Recent Labs  Lab 04-Jul-2020 2120 06/07/20 0423  NA 135 137  K 4.2 4.4  CL 97* 102  CO2 25 24  GLUCOSE 213* 229*  BUN 48* 42*  CREATININE 1.06 0.89  CALCIUM 9.1 8.1*  MG 2.2 1.9  PHOS  --  2.6   GFR: Estimated Creatinine Clearance: 69.5 mL/min (by C-G formula based on SCr of 0.89 mg/dL). Liver Function Tests: Recent Labs  Lab  06/07/20 0423  AST 41  ALT 18  ALKPHOS 51  BILITOT 0.5  PROT 6.7  ALBUMIN 2.6*   No results for input(s): LIPASE, AMYLASE in the last 168 hours. No results for input(s): AMMONIA in the last 168 hours. Coagulation Profile: No results for input(s): INR, PROTIME in the last 168 hours. Cardiac Enzymes: No results for input(s): CKTOTAL, CKMB, CKMBINDEX, TROPONINI in the last 168 hours. BNP (last 3 results) No results for input(s): PROBNP in the last 8760 hours. HbA1C: No  results for input(s): HGBA1C in the last 72 hours. CBG: No results for input(s): GLUCAP in the last 168 hours. Lipid Profile: No results for input(s): CHOL, HDL, LDLCALC, TRIG, CHOLHDL, LDLDIRECT in the last 72 hours. Thyroid Function Tests: Recent Labs    06/07/20 0423  TSH 3.091   Anemia Panel: Recent Labs    06/08/2020 2120 06/07/20 0423  FERRITIN 114 100   Sepsis Labs: Recent Labs  Lab 06/23/2020 2120  PROCALCITON 1.10    No results found for this or any previous visit (from the past 240 hour(s)).       Radiology Studies: DG Chest Portable 1 View  Result Date: 06/10/2020 CLINICAL DATA:  Shortness of breath.  COVID-19 positive EXAM: PORTABLE CHEST 1 VIEW COMPARISON:  February 13, 2007 FINDINGS: There is ill-defined airspace opacity in the right mid lung and both lung bases. Equivocal increased opacity left mid lung. Heart is upper normal in size with pulmonary vascularity normal. Patient is status post coronary artery bypass grafting. No adenopathy. There is aortic atherosclerosis. No bone lesions IMPRESSION: Ill-defined airspace opacity, more on the right than on the left without consolidation. Suspect multifocal pneumonia, likely of atypical organism etiology. A degree of mild underlying interstitial edema is possible. Status post coronary artery bypass grafting. Heart upper normal in size. Aortic Atherosclerosis (ICD10-I70.0). Electronically Signed   By: Lowella Grip III M.D.   On: 06/13/2020 21:52        Scheduled Meds: . dexamethasone  6 mg Oral Q24H  . feeding supplement (OSMOLITE 1.5 CAL)  474 mL Per Tube TID  . levothyroxine  125 mcg Oral Q0600  . metoprolol tartrate  12.5 mg Oral BID  . pantoprazole (PROTONIX) IV  40 mg Intravenous Q24H  . rivaroxaban  20 mg Per Tube Daily  . simvastatin  40 mg Oral Daily   Continuous Infusions: . amiodarone 30 mg/hr (06/07/20 0734)  . remdesivir 100 mg in NS 100 mL Stopped (06/07/20 1209)    Assessment & Plan:    Principal Problem:   Pneumonia due to COVID-19 virus Active Problems:   Hyperlipidemia, mixed   Acquired hypothyroidism   Pharyngeal dysphagia   Atrial fibrillation with RVR (HCC)   Shortness of breath   Generalized weakness   AKI (acute kidney injury) ([place])   Jermaine Campbell is a 75 y.o. 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 ED complaining of shortness of breath.   #) Severe COVID-19 pneumonia: outpatient COVID-19 testing on 06/05/2020 found to be positive -chest x-ray suggestive of bilateral opacities suggestive of multifocal pneumonia of atypical etiology.  Was hypoxic, 85% , now requiring 3L.  continue remdesivir Continue steroid Inhalers Follow inflammatory markers-trending down    Acute hypoxic respiratory failure-likely from covid pna Was noted to be hypoxic on room air.  Currently on 3 L. Treat with steroid and Remdesivir Keep 02 sat >92%       #) Atrial fibrillation with RVR: In the setting of a known history of paroxysmal atrial fibrillation, the patient presented with HR 180's - 190's -was given IV adenosine demonstrating findings consistent with A. fib RVR Cardiology was consulted Was electrically cardioverted in the ED into normal sinus rhythm, initiation of IV amiodarone post cardioversion. Cardizem drip discontinued Continue beta blk Continue Xarelto       #) Generalized weakness: the patient reports 4-5 day duration of generalized weakness. Likely 2/2 Covid  infection/pna Will consult PT/OT        #) Acute kidney injury:mild Improved. Creatinine at baseline Continue to monitor     #) Acquired hypothyroidism: continue synthroid     #) Hyperlipidemia: continue statin   #) Chronic anemia: The patient has a history of chronic normocytic anemia No sx/s of bleed Will continue to monitor. Of note, presenting hemoglobin of 10 consistent with baseline, and relative to most recent prior hemoglobin of 9.7 when checked on 05/10/2020.  No evidence of active bleeding at this time.         DVT prophylaxis: xarelto Code Status: Full Family Communication:   Status is: Inpatient  Remains inpatient appropriate because:Inpatient level of care appropriate due to severity of illness   Dispo: The patient is from: Home              Anticipated d/c is to: Home              Patient currently is not medically stable to d/c.   Difficult to place patient No            LOS: 1 day   Time spent: 35 min with >50% on coc    Nolberto Hanlon, MD [facility] Pager [phone]  If 7PM-7AM, please contact night-coverage 06/07/2020, 3:13 PM

## 2020-06-07 NOTE — Consult Note (Signed)
[facility] Cardiology Consultation Note  Patient ID: Jermaine Campbell, MRN: [id number], DOB/AGE: 10/13/1945 75 y.o. Admit date: 06/13/2020   Date of Consult: 06/07/2020 Primary Physician: Marinda Elk, MD Primary Cardiologist: Paraschos  Chief Complaint:  Chief Complaint  Patient presents with  . Weakness  . COVID +   Reason for Consult: Atrial fibrillation  HPI: 75 y.o. male with known coronary artery disease status post coronary to bypass graft hypertension hyperlipidemia and cancer with PEG tube placement having paroxysmal nonvalvular atrial fibrillation in the past on appropriate medication management.  The patient has had acute shortness of breath cough and congestion and now has been diagnosed with COVID infection.  With this the patient had a paroxysmal nonvalvular atrial fibrillation exacerbation with rapid ventricular rate 180 bpm which he was short of breath weak and fatigued.  After long discussion with the emergency room physician plan was performed including amiodarone infusion with electrical cardioversion to normal sinus rhythm.  The patient is more comfortable at this time and now remaining in normal sinus rhythm and hemodynamically stable without evidence of myocardial infarction or congestive heart failure  Past Medical History:  Diagnosis Date  . Anemia   . Basal cell carcinoma 12/30/2019   L lower pretibia, EDC  . Bradycardia   . 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 Cancer of prostate ([place]) 2019  . Coronary artery disease    atherosclerosis of native coronary artery of native heart without angina pectoris  . Deaf    Left ear  . Dysphagia    history of d/t throat cancer  . Dysrhythmia    paroxysmal atrial fibrillation, bradycardia  . H/O syncope    several yrs ago  . History of BPH   . Hypercholesteremia   . Hypertension   . Hypothyroidism    s/p radiation tx - throat CA  . Melanoma  ([place]) 06/11/2018   Melanoma In Situ Posterior neck  . Myocardial infarction ([place])    1996  . Neck stiffness    s/p radiation tx - Throat CA  . Seizures ([place])    last one 2 yrs ago. not medicated. d/t injury to frontal lobe  . Vertigo    no episodes 4-5 yrs/dizziness  . Wears hearing aid    Right ear/ deaf in left ear      Surgical History:  Past Surgical History:  Procedure Laterality Date  . BICEPT TENODESIS Right 01/26/2016   Procedure: BICEPS TENODESIS- open;  Surgeon: Corky Mull, MD;  Location: [facility] ORS;  Service: Orthopedics;  Laterality: Right;  . CARDIAC CATHETERIZATION     before CABG/ DR PARACHOS IS CARDIOLOGIST  . CATARACT EXTRACTION W/PHACO Left 07/30/2014   Procedure: CATARACT EXTRACTION PHACO AND INTRAOCULAR LENS PLACEMENT ([place]) SHUGARCAINE;  Surgeon: Leandrew Koyanagi, MD;  Location: [place];  Service: Ophthalmology;  Laterality: Left;  SHUGARCAINE  . CATARACT EXTRACTION W/PHACO Right 10/29/2014   Procedure: CATARACT EXTRACTION PHACO AND INTRAOCULAR LENS PLACEMENT ([facility]);  Surgeon: Leandrew Koyanagi, MD;  Location: [place];  Service: Ophthalmology;  Laterality: Right;  [place]  . COLONOSCOPY    . COLONOSCOPY WITH PROPOFOL N/A 04/14/2017   Procedure: COLONOSCOPY WITH PROPOFOL;  Surgeon: Manya Silvas, MD;  Location[facility];  Service: Endoscopy;  Laterality: N/A;  . CORONARY ARTERY BYPASS GRAFT  1996   3 vessel  . EYE SURGERY Bilateral 2015   cataract extractions  . FRACTURE SURGERY  right arm. as a child  . HOLEP-LASER ENUCLEATION OF THE PROSTATE WITH MORCELLATION N/A 08/23/2017   Procedure: HOLEP-LASER ENUCLEATION OF THE PROSTATE WITH MORCELLATION;  Surgeon: Hollice Espy, MD;  Location: [facility] ORS;  Service: Urology;  Laterality: N/A;  . OPEN SUBSCAPULARIS REPAIR Right 01/26/2016   Procedure: arthroscopic  SUBSCAPULARIS REPAIR;  Surgeon: Corky Mull, MD;  Location: [facility] ORS;  Service: Orthopedics;  Laterality: Right;  .  SHOULDER ARTHROSCOPY WITH OPEN ROTATOR CUFF REPAIR Right 01/26/2016   Procedure: SHOULDER ARTHROSCOPY WITH MINI OPEN ROTATOR CUFF REPAIR;  Surgeon: Corky Mull, MD;  Location: [facility] ORS;  Service: Orthopedics;  Laterality: Right;  . SHOULDER ARTHROSCOPY WITH SUBACROMIAL DECOMPRESSION Right 01/26/2016   Procedure: SHOULDER ARTHROSCOPY WITH SUBACROMIAL DECOMPRESSION and debridement;  Surgeon: Corky Mull, MD;  Location: [facility] ORS;  Service: Orthopedics;  Laterality: Right;  . THROAT SURGERY     excision - cancer  . TONSILLECTOMY       Home Meds: Prior to Admission medications   Medication Sig Start Date End Date Taking? Authorizing Provider  amoxicillin-clavulanate (AUGMENTIN) 875-125 MG tablet Take 1 tablet by mouth in the morning and at bedtime. For 7 days. 06/05/20 06/12/20  [provider]  aspirin EC 81 MG tablet Take 81 mg by mouth daily. Swallow whole.    [provider]  levothyroxine (SYNTHROID) 125 MCG tablet Take 1 tablet by mouth daily. Take on an empty stomach with a glass of water at least 30 to 60 minutes before breakfast. 05/07/20 05/07/21  [provider]  levothyroxine (SYNTHROID, LEVOTHROID) 100 MCG tablet Take 100 mcg by mouth daily before breakfast.  Patient not taking: Reported on 06/07/2020    [provider]  metFORMIN (GLUCOPHAGE) 500 MG tablet Place 500 mg into feeding tube 2 (two) times daily with a meal.    [provider]  metoprolol tartrate (LOPRESSOR) 25 MG tablet Take 12.5 mg by mouth 2 (two) times daily.     [provider]  pantoprazole sodium (PROTONIX) 40 mg/20 mL PACK Take 20 mLs by mouth daily. 05/10/20 06/09/20  [provider]  simvastatin (ZOCOR) 40 MG tablet Take 40 mg by mouth daily.     [provider]  vitamin B-12 (CYANOCOBALAMIN) 1000 MCG tablet Take 1,000 mcg by mouth daily.    [provider]  XARELTO 20 MG TABS tablet  09/10/18   [provider]    Inpatient  Medications:  . dexamethasone  6 mg Oral Q24H  . metoprolol tartrate  12.5 mg Oral BID  . pantoprazole (PROTONIX) IV  40 mg Intravenous Q24H  . rivaroxaban  20 mg Per Tube Daily  . simvastatin  40 mg Oral Daily   . amiodarone    . diltiazem (CARDIZEM) infusion 5 mg/hr (06/07/20 0215)  . remdesivir 100 mg in NS 100 mL      Allergies:  Allergies  Allergen Reactions  . Tamsulosin Hcl Other (See Comments)    Makes blood pressure drop low and pass out    Social History   Socioeconomic History  . Marital status: Married    Spouse name: Not on file  . Number of children: Not on file  . Years of education: Not on file  . Highest education level: Not on file  Occupational History  . Not on file  Tobacco Use  . Smoking status: Never Smoker  . Smokeless tobacco: Never Used  Vaping Use  . Vaping Use: Never used  Substance and Sexual Activity  . Alcohol  use: No    Comment: rarely  . Drug use: No  . Sexual activity: Not Currently  Other Topics Concern  . Not on file  Social History Narrative  . Not on file   Social Determinants of Health   Financial Resource Strain: Not on file  Food Insecurity: Not on file  Transportation Needs: Not on file  Physical Activity: Not on file  Stress: Not on file  Social Connections: Not on file  Intimate Partner Violence: Not on file     Family History  Problem Relation Age of Onset  . Heart attack Father      Review of Systems Positive for shortness of breath Negative for: General:  chills, fever, night sweats or weight changes.  Cardiovascular: PND orthopnea syncope dizziness  Dermatological skin lesions rashes Respiratory: Cough congestion Urologic: Frequent urination urination at night and hematuria Abdominal: negative for nausea, vomiting, diarrhea, bright red blood per rectum, melena, or hematemesis Neurologic: negative for visual changes, and/or hearing changes  All other systems reviewed and are otherwise negative except  as noted above.  Labs: No results for input(s): CKTOTAL, CKMB, TROPONINI in the last 72 hours. Lab Results  Component Value Date   WBC 5.2 06/07/2020   HGB 8.2 (L) 06/07/2020   HCT 26.3 (L) 06/07/2020   MCV 88.0 06/07/2020   PLT 103 (L) 06/07/2020    Recent Labs  Lab 06/07/20 0423  NA 137  K 4.4  CL 102  CO2 24  BUN 42*  CREATININE 0.89  CALCIUM 8.1*  PROT 6.7  BILITOT 0.5  ALKPHOS 51  ALT 18  AST 41  GLUCOSE 229*   No results found for: CHOL, HDL, LDLCALC, TRIG Lab Results  Component Value Date   DDIMER 1.90 (H) 06/07/2020    Radiology/Studies:  DG ABDOMEN PEG TUBE LOCATION  Result Date: 05/15/2020 CLINICAL DATA:  Status post PEG tube placement EXAM: ABDOMEN - 1 VIEW COMPARISON:  10/16/2019 FINDINGS: Supine portable view of the abdomen. Gastrostomy tube balloon projects over the body of the stomach. Contrast opacifies the stomach and duodenum. No gross extravasation. IMPRESSION: Gastrostomy tube overlies the body of the stomach. No gross extravasation. Electronically Signed   By: Donavan Foil M.D.   On: 05/15/2020 20:28   DG Chest Portable 1 View  Result Date: 06-12-2020 CLINICAL DATA:  Shortness of breath.  COVID-19 positive EXAM: PORTABLE CHEST 1 VIEW COMPARISON:  February 13, 2007 FINDINGS: There is ill-defined airspace opacity in the right mid lung and both lung bases. Equivocal increased opacity left mid lung. Heart is upper normal in size with pulmonary vascularity normal. Patient is status post coronary artery bypass grafting. No adenopathy. There is aortic atherosclerosis. No bone lesions IMPRESSION: Ill-defined airspace opacity, more on the right than on the left without consolidation. Suspect multifocal pneumonia, likely of atypical organism etiology. A degree of mild underlying interstitial edema is possible. Status post coronary artery bypass grafting. Heart upper normal in size. Aortic Atherosclerosis (ICD10-I70.0). Electronically Signed   By: Lowella Grip  III M.D.   On: 06-12-2020 21:52    EKG: Normal sinus rhythm with left atrial enlargement  Weights: Filed Weights   2020/06/12 2055  Weight: 76 kg     Physical Exam: Blood pressure 100/73, pulse 68, temperature 99 F (37.2 C), temperature source Oral, resp. rate (!) 25, height 5\' 9"  (1.753 m), weight 76 kg, SpO2 96 %. Body mass index is 24.74 kg/m. General: Well developed, well nourished, in no acute distress. Head eyes ears nose  throat: Normocephalic, atraumatic, sclera non-icteric, no xanthomas, nares are without discharge. No apparent thyromegaly and/or mass  Lungs: Normal respiratory effort.  Diffuse wheezes, no rales, some rhonchi.  Heart: RRR with normal S1 S2.  2 to 3+ left upper sternal border murmur murmur gallop, no rub, PMI is normal size and placement, carotid upstroke normal without bruit, jugular venous pressure is normal Abdomen: Soft, non-tender, non-distended with normoactive bowel sounds. No hepatomegaly. No rebound/guarding. No obvious abdominal masses. Abdominal aorta is normal size without bruit Extremities: No edema. no cyanosis, no clubbing, no ulcers  Peripheral : 2+ bilateral upper extremity pulses, 2+ bilateral femoral pulses, 2+ bilateral dorsal pedal pulse Neuro: Alert and oriented. No facial asymmetry. No focal deficit. Moves all extremities spontaneously. Musculoskeletal: Normal muscle tone without kyphosis Psych:  Responds to questions appropriately with a normal affect.    Assessment: 75 year old male with hypertension hyperlipidemia coronary artery disease status post coronary bypass graft and paroxysmal nonvalvular atrial fibrillation with exacerbation due to acute COVID infection and respiratory concerns now electrically cardioverted into normal sinus rhythm without evidence of congestive heart failure myocardial infarction  Plan: 1.  Continue amiodarone drip for infusion of 18hours then change to oral amiodarone 200 mg twice per day for maintenance of  normal sinus rhythm due to acute rapid heart rate of atrial fibrillation 2.  Continue anticoagulation for further risk reduction and stroke with atrial fibrillation without change 3.  Metoprolol for heart rate control of atrial fibrillation and possibly okay for discontinuation of diltiazem drip at this time due to maintenance of normal rhythm 4.  No further cardiac diagnostics necessary at this time  Signed, Corey Skains M.D. [facility] 06/07/2020, 7:15 AM

## 2020-06-07 NOTE — Progress Notes (Addendum)
PT Cancellation Note  Patient Details Name: Jermaine Campbell MRN: [id number] DOB: 1945-05-19   Cancelled Treatment:    Reason Eval/Treat Not Completed: Other (comment) PT orders received, chart reviewed. Pt recently started on amiodarone drip (06/07/20 at 0734). Per therapy protocols, will hold PT evaluation until 24 hrs after med initiation. Will f/u as able.  Lavone Nian, PT, DPT 06/07/20, 8:39 AM    Waunita Schooner 06/07/2020, 8:37 AM

## 2020-06-07 NOTE — ED Notes (Signed)
Updated wife on pt condition. Wife made this RN aware of pt needing tube feeding. Tube feeding instructions located on sheet from wife in pt canvas bag. Wife requesting staff to keep close eye on feeding tube as the last time the pt had significant amount of coughing it caused to tube to dislodge. Tube recently replaced. Wife also requesting to have pt call her using his cell phone in same bag when awake for the morning.

## 2020-06-07 NOTE — ED Notes (Signed)
Pts wife updated via phone

## 2020-06-07 NOTE — ED Notes (Signed)
Pt hearing aids plugged in case to charge overnight. Lights dimmed for rest. Pt denies any further needs at this time.

## 2020-06-07 NOTE — ED Notes (Signed)
Pt had BM in bedpan with assistance from [place], [place].

## 2020-06-07 NOTE — ED Notes (Signed)
Pt stood at bedside while sheets were changed. Pt made comfortable. Asking for remote. Denies further needs.

## 2020-06-07 NOTE — ED Notes (Signed)
Pt placed on bedpan for BM.

## 2020-06-07 NOTE — ED Notes (Signed)
Pt only eats/drinks through tube. Messaged MD about continuous fluid orders vs free water. Confirmed with pharmacy that pt has a g tube. Pt denies further needs at this time.

## 2020-06-07 NOTE — ED Notes (Signed)
Pt given paper towels and fresh emesis bag for sputum. Pt covered with warm blankets. Lights dimmed for rest. Pt instructed to use call light if needs arise.

## 2020-06-08 ENCOUNTER — Inpatient Hospital Stay: Payer: Medicare Other

## 2020-06-08 ENCOUNTER — Inpatient Hospital Stay: Admit: 2020-06-08 | Payer: Medicare Other

## 2020-06-08 ENCOUNTER — Encounter: Payer: Self-pay | Admitting: Internal Medicine

## 2020-06-08 DIAGNOSIS — J9600 Acute respiratory failure, unspecified whether with hypoxia or hypercapnia: Secondary | ICD-10-CM | POA: Diagnosis not present

## 2020-06-08 DIAGNOSIS — R0602 Shortness of breath: Secondary | ICD-10-CM

## 2020-06-08 DIAGNOSIS — J1282 Pneumonia due to coronavirus disease 2019: Secondary | ICD-10-CM

## 2020-06-08 DIAGNOSIS — U071 COVID-19: Principal | ICD-10-CM

## 2020-06-08 LAB — HEMOGLOBIN A1C
Hgb A1c MFr Bld: 6.3 % — ABNORMAL HIGH (ref 4.8–5.6)
Mean Plasma Glucose: 134.11 mg/dL

## 2020-06-08 LAB — CBC WITH DIFFERENTIAL/PLATELET
Abs Immature Granulocytes: 0.2 10*3/uL — ABNORMAL HIGH (ref 0.00–0.07)
Basophils Absolute: 0 10*3/uL (ref 0.0–0.1)
Basophils Relative: 0 %
Eosinophils Absolute: 0 10*3/uL (ref 0.0–0.5)
Eosinophils Relative: 0 %
HCT: 29.9 % — ABNORMAL LOW (ref 39.0–52.0)
Hemoglobin: 9.1 g/dL — ABNORMAL LOW (ref 13.0–17.0)
Immature Granulocytes: 2 %
Lymphocytes Relative: 2 %
Lymphs Abs: 0.2 10*3/uL — ABNORMAL LOW (ref 0.7–4.0)
MCH: 27.5 pg (ref 26.0–34.0)
MCHC: 30.4 g/dL (ref 30.0–36.0)
MCV: 90.3 fL (ref 80.0–100.0)
Monocytes Absolute: 0.5 10*3/uL (ref 0.1–1.0)
Monocytes Relative: 4 %
Neutro Abs: 10.3 10*3/uL — ABNORMAL HIGH (ref 1.7–7.7)
Neutrophils Relative %: 92 %
Platelets: 192 10*3/uL (ref 150–400)
RBC: 3.31 MIL/uL — ABNORMAL LOW (ref 4.22–5.81)
RDW: 19.6 % — ABNORMAL HIGH (ref 11.5–15.5)
WBC: 11.2 10*3/uL — ABNORMAL HIGH (ref 4.0–10.5)
nRBC: 0.6 % — ABNORMAL HIGH (ref 0.0–0.2)

## 2020-06-08 LAB — GLUCOSE, CAPILLARY
Glucose-Capillary: 130 mg/dL — ABNORMAL HIGH (ref 70–99)
Glucose-Capillary: 161 mg/dL — ABNORMAL HIGH (ref 70–99)
Glucose-Capillary: 182 mg/dL — ABNORMAL HIGH (ref 70–99)
Glucose-Capillary: 310 mg/dL — ABNORMAL HIGH (ref 70–99)
Glucose-Capillary: 312 mg/dL — ABNORMAL HIGH (ref 70–99)
Glucose-Capillary: 319 mg/dL — ABNORMAL HIGH (ref 70–99)
Glucose-Capillary: 73 mg/dL (ref 70–99)
Glucose-Capillary: 99 mg/dL (ref 70–99)

## 2020-06-08 LAB — COMPREHENSIVE METABOLIC PANEL
ALT: 21 U/L (ref 0–44)
ALT: 178 U/L — ABNORMAL HIGH (ref 0–44)
AST: 48 U/L — ABNORMAL HIGH (ref 15–41)
AST: 204 U/L — ABNORMAL HIGH (ref 15–41)
Albumin: 3.3 g/dL — ABNORMAL LOW (ref 3.5–5.0)
Albumin: 2.3 g/dL — ABNORMAL LOW (ref 3.5–5.0)
Alkaline Phosphatase: 58 U/L (ref 38–126)
Alkaline Phosphatase: 58 U/L (ref 38–126)
Anion gap: 18 — ABNORMAL HIGH (ref 5–15)
Anion gap: 11 (ref 5–15)
BUN: 51 mg/dL — ABNORMAL HIGH (ref 8–23)
BUN: 54 mg/dL — ABNORMAL HIGH (ref 8–23)
CO2: 21 mmol/L — ABNORMAL LOW (ref 22–32)
CO2: 27 mmol/L (ref 22–32)
Calcium: 9.1 mg/dL (ref 8.9–10.3)
Calcium: 7.7 mg/dL — ABNORMAL LOW (ref 8.9–10.3)
Chloride: 101 mmol/L (ref 98–111)
Chloride: 101 mmol/L (ref 98–111)
Creatinine, Ser: 0.99 mg/dL (ref 0.61–1.24)
Creatinine, Ser: 1.38 mg/dL — ABNORMAL HIGH (ref 0.61–1.24)
GFR, Estimated: >60 mL/min (ref >60)
GFR, Estimated: 53 mL/min — ABNORMAL LOW (ref >60)
Glucose, Bld: 216 mg/dL — ABNORMAL HIGH (ref 70–99)
Glucose, Bld: 399 mg/dL — ABNORMAL HIGH (ref 70–99)
Potassium: 4.4 mmol/L (ref 3.5–5.1)
Potassium: 4.9 mmol/L (ref 3.5–5.1)
Sodium: 140 mmol/L (ref 135–145)
Sodium: 139 mmol/L (ref 135–145)
Total Bilirubin: 0.5 mg/dL (ref 0.3–1.2)
Total Bilirubin: 0.5 mg/dL (ref 0.3–1.2)
Total Protein: 7.8 g/dL (ref 6.5–8.1)
Total Protein: 6.3 g/dL — ABNORMAL LOW (ref 6.5–8.1)

## 2020-06-08 LAB — LACTIC ACID, PLASMA
Lactic Acid, Venous: 3.8 mmol/L (ref 0.5–1.9)
Lactic Acid, Venous: 2.5 mmol/L (ref 0.5–1.9)
Lactic Acid, Venous: 1.2 mmol/L (ref 0.5–1.9)

## 2020-06-08 LAB — PHOSPHORUS
Phosphorus: 6.3 mg/dL — ABNORMAL HIGH (ref 2.5–4.6)
Phosphorus: 5.7 mg/dL — ABNORMAL HIGH (ref 2.5–4.6)

## 2020-06-08 LAB — MAGNESIUM
Magnesium: 2.4 mg/dL (ref 1.7–2.4)
Magnesium: 2.5 mg/dL — ABNORMAL HIGH (ref 1.7–2.4)

## 2020-06-08 LAB — MRSA PCR SCREENING: MRSA by PCR: NEGATIVE

## 2020-06-08 LAB — D-DIMER, QUANTITATIVE: D-Dimer, Quant: 7.08 ug/mL-FEU — ABNORMAL HIGH (ref 0.00–0.50)

## 2020-06-08 LAB — C-REACTIVE PROTEIN: CRP: 23.9 mg/dL — ABNORMAL HIGH (ref <1.0)

## 2020-06-08 LAB — TRIGLYCERIDES: Triglycerides: 37 mg/dL (ref <150)

## 2020-06-08 LAB — CALCIUM, IONIZED: Calcium, Ionized, Serum: 4.8 mg/dL (ref 4.5–5.6)

## 2020-06-08 LAB — PROCALCITONIN: Procalcitonin: 0.8 ng/mL

## 2020-06-08 LAB — PROTIME-INR
INR: 1.2 (ref 0.8–1.2)
Prothrombin Time: 15.3 seconds — ABNORMAL HIGH (ref 11.4–15.2)

## 2020-06-08 MED ORDER — FENTANYL 2500MCG IN NS 250ML (10MCG/ML) PREMIX INFUSION
25.0000 ug/h | INTRAVENOUS | Status: DC
  Administered 2020-06-08: 25 ug/h via INTRAVENOUS
  Administered 2020-06-08 – 2020-06-10 (×3): 150 ug/h via INTRAVENOUS
  Administered 2020-06-10 – 2020-06-11 (×2): 200 ug/h via INTRAVENOUS
  Administered 2020-06-12: 25 ug/h via INTRAVENOUS
  Administered 2020-06-13: 100 ug/h via INTRAVENOUS
  Filled 2020-06-08 (×10): qty 250

## 2020-06-08 MED ORDER — NOREPINEPHRINE 4 MG/250ML-% IV SOLN: 0.0000 ug/min | INTRAVENOUS | Status: DC

## 2020-06-08 MED ORDER — FENTANYL BOLUS VIA INFUSION
25.0000 ug | INTRAVENOUS | Status: DC | PRN
  Administered 2020-06-08: 50 ug via INTRAVENOUS
  Administered 2020-06-10 (×2): 100 ug via INTRAVENOUS
  Administered 2020-06-10: 50 ug via INTRAVENOUS
  Administered 2020-06-10 – 2020-06-12 (×3): 100 ug via INTRAVENOUS
  Administered 2020-06-14: 50 ug via INTRAVENOUS
  Filled 2020-06-08: qty 100

## 2020-06-08 MED ORDER — NOREPINEPHRINE 16 MG/250ML-% IV SOLN
0.0000 ug/min | INTRAVENOUS | Status: DC
Start: 2020-06-08 — End: 2020-06-09
  Administered 2020-06-08: 20 ug/min via INTRAVENOUS
  Administered 2020-06-08: 10 ug/min via INTRAVENOUS
  Administered 2020-06-08: 30 ug/min via INTRAVENOUS
  Filled 2020-06-08 (×3): qty 250

## 2020-06-08 MED ORDER — ACETAMINOPHEN 650 MG RE SUPP: 650.0000 mg | Freq: Four times a day (QID) | RECTAL | Status: DC | PRN

## 2020-06-08 MED ORDER — VITAL HIGH PROTEIN PO LIQD
1000.0000 mL | ORAL | Status: DC
  Administered 2020-06-08 – 2020-06-09 (×2): 1000 mL

## 2020-06-08 MED ORDER — MIDAZOLAM HCL 2 MG/2ML IJ SOLN
1.0000 mg | INTRAMUSCULAR | Status: DC | PRN
  Administered 2020-06-12: 2 mg via INTRAVENOUS
  Filled 2020-06-08 (×2): qty 2

## 2020-06-08 MED ORDER — DOCUSATE SODIUM 50 MG/5ML PO LIQD
100.0000 mg | Freq: Two times a day (BID) | ORAL | Status: DC
  Administered 2020-06-08 – 2020-06-13 (×11): 100 mg
  Filled 2020-06-08 (×11): qty 10

## 2020-06-08 MED ORDER — HYDROCORTISONE NA SUCCINATE PF 100 MG IJ SOLR
100.0000 mg | Freq: Four times a day (QID) | INTRAMUSCULAR | Status: DC
  Administered 2020-06-08 – 2020-06-11 (×12): 100 mg via INTRAVENOUS
  Filled 2020-06-08 (×12): qty 2

## 2020-06-08 MED ORDER — ALBUTEROL SULFATE (2.5 MG/3ML) 0.083% IN NEBU: 2.5000 mg | INHALATION_SOLUTION | RESPIRATORY_TRACT | Status: DC | PRN

## 2020-06-08 MED ORDER — ACETAMINOPHEN 325 MG PO TABS
650.0000 mg | ORAL_TABLET | Freq: Four times a day (QID) | ORAL | Status: DC | PRN
Start: 2020-06-08 — End: 2020-06-14

## 2020-06-08 MED ORDER — ASCORBIC ACID 500 MG PO TABS
500.0000 mg | ORAL_TABLET | Freq: Every day | ORAL | Status: DC
  Administered 2020-06-09 – 2020-06-13 (×5): 500 mg
  Filled 2020-06-08 (×5): qty 1

## 2020-06-08 MED ORDER — PROPOFOL 1000 MG/100ML IV EMUL
0.0000 ug/kg/min | INTRAVENOUS | Status: DC
  Administered 2020-06-08: 5 ug/kg/min via INTRAVENOUS
  Administered 2020-06-08 (×2): 40 ug/kg/min via INTRAVENOUS
  Administered 2020-06-08: 50 ug/kg/min via INTRAVENOUS
  Administered 2020-06-08 – 2020-06-09 (×3): 40 ug/kg/min via INTRAVENOUS
  Administered 2020-06-10: 15 ug/kg/min via INTRAVENOUS
  Filled 2020-06-08 (×8): qty 100

## 2020-06-08 MED ORDER — CHLORHEXIDINE GLUCONATE 0.12% ORAL RINSE (MEDLINE KIT)
15.0000 mL | Freq: Two times a day (BID) | OROMUCOSAL | Status: DC
  Administered 2020-06-08 – 2020-06-14 (×13): 15 mL via OROMUCOSAL

## 2020-06-08 MED ORDER — ZINC SULFATE 220 (50 ZN) MG PO CAPS
220.0000 mg | ORAL_CAPSULE | Freq: Every day | ORAL | Status: DC
  Administered 2020-06-09 – 2020-06-13 (×5): 220 mg
  Filled 2020-06-08 (×6): qty 1

## 2020-06-08 MED ORDER — CHLORHEXIDINE GLUCONATE CLOTH 2 % EX PADS
6.0000 | MEDICATED_PAD | Freq: Every day | CUTANEOUS | Status: DC
  Administered 2020-06-09 – 2020-06-13 (×5): 6 via TOPICAL

## 2020-06-08 MED ORDER — MIDAZOLAM HCL 2 MG/2ML IJ SOLN
1.0000 mg | INTRAMUSCULAR | Status: AC | PRN
  Administered 2020-06-10 (×3): 2 mg via INTRAVENOUS
  Filled 2020-06-08 (×3): qty 2

## 2020-06-08 MED ORDER — FUROSEMIDE 10 MG/ML IJ SOLN
INTRAMUSCULAR | Status: AC
  Administered 2020-06-08: 40 mg via INTRAVENOUS
  Filled 2020-06-08: qty 4

## 2020-06-08 MED ORDER — RIVAROXABAN 15 MG PO TABS
15.0000 mg | ORAL_TABLET | Freq: Every day | ORAL | Status: DC
  Administered 2020-06-09 – 2020-06-10 (×2): 15 mg
  Filled 2020-06-08: qty 1

## 2020-06-08 MED ORDER — LEVOTHYROXINE SODIUM 50 MCG PO TABS
125.0000 ug | ORAL_TABLET | Freq: Every day | ORAL | Status: DC
  Administered 2020-06-09 – 2020-06-14 (×6): 125 ug
  Filled 2020-06-08 (×7): qty 1

## 2020-06-08 MED ORDER — MIDAZOLAM HCL 2 MG/2ML IJ SOLN
1.0000 mg | INTRAMUSCULAR | Status: DC | PRN
  Filled 2020-06-08: qty 2

## 2020-06-08 MED ORDER — SIMVASTATIN 20 MG PO TABS
40.0000 mg | ORAL_TABLET | Freq: Every day | ORAL | Status: DC
  Administered 2020-06-09 – 2020-06-13 (×5): 40 mg
  Filled 2020-06-08 (×5): qty 2

## 2020-06-08 MED ORDER — DEXTROSE IN LACTATED RINGERS 5 % IV SOLN: INTRAVENOUS | Status: DC

## 2020-06-08 MED ORDER — INSULIN ASPART 100 UNIT/ML IJ SOLN
INTRAMUSCULAR | Status: AC
  Administered 2020-06-08: 15 [IU] via SUBCUTANEOUS
  Filled 2020-06-08: qty 1

## 2020-06-08 MED ORDER — POLYETHYLENE GLYCOL 3350 17 G PO PACK
17.0000 g | PACK | Freq: Every day | ORAL | Status: DC
  Administered 2020-06-09 – 2020-06-13 (×5): 17 g
  Filled 2020-06-08 (×5): qty 1

## 2020-06-08 MED ORDER — METHYLPREDNISOLONE SODIUM SUCC 125 MG IJ SOLR
1.0000 mg/kg | Freq: Two times a day (BID) | INTRAMUSCULAR | Status: DC
  Administered 2020-06-08: 75.625 mg via INTRAVENOUS
  Filled 2020-06-08: qty 2

## 2020-06-08 MED ORDER — PROSOURCE TF PO LIQD
45.0000 mL | Freq: Two times a day (BID) | ORAL | Status: DC
  Administered 2020-06-08: 45 mL
  Filled 2020-06-08: qty 45

## 2020-06-08 MED ORDER — MIDAZOLAM HCL 2 MG/2ML IJ SOLN: 1.0000 mg | INTRAMUSCULAR | Status: DC | PRN

## 2020-06-08 MED ORDER — VASOPRESSIN 20 UNITS/100 ML INFUSION FOR SHOCK
0.0000 [IU]/min | INTRAVENOUS | Status: DC
  Administered 2020-06-08: 0.04 [IU]/min via INTRAVENOUS
  Administered 2020-06-08 – 2020-06-10 (×2): 0.03 [IU]/min via INTRAVENOUS
  Administered 2020-06-11: 0.04 [IU]/min via INTRAVENOUS
  Filled 2020-06-08 (×5): qty 100

## 2020-06-08 MED ORDER — FENTANYL CITRATE (PF) 100 MCG/2ML IJ SOLN
25.0000 ug | Freq: Once | INTRAMUSCULAR | Status: AC
  Administered 2020-06-08: 25 ug via INTRAVENOUS

## 2020-06-08 MED ORDER — INSULIN GLARGINE 100 UNIT/ML ~~LOC~~ SOLN
15.0000 [IU] | Freq: Every day | SUBCUTANEOUS | Status: DC
  Administered 2020-06-08: 15 [IU] via SUBCUTANEOUS
  Filled 2020-06-08 (×2): qty 0.15

## 2020-06-08 MED ORDER — MIDAZOLAM HCL 2 MG/2ML IJ SOLN
2.0000 mg | Freq: Once | INTRAMUSCULAR | Status: AC
  Administered 2020-06-08: 2 mg via INTRAVENOUS

## 2020-06-08 MED ORDER — FUROSEMIDE 10 MG/ML IJ SOLN: 40.0000 mg | Freq: Once | INTRAMUSCULAR | Status: AC

## 2020-06-08 MED ORDER — ORAL CARE MOUTH RINSE
15.0000 mL | OROMUCOSAL | Status: DC
  Administered 2020-06-08 – 2020-06-14 (×58): 15 mL via OROMUCOSAL

## 2020-06-08 MED ORDER — INSULIN ASPART 100 UNIT/ML IJ SOLN
0.0000 [IU] | INTRAMUSCULAR | Status: DC
  Administered 2020-06-08 (×2): 4 [IU] via SUBCUTANEOUS
  Administered 2020-06-08: 15 [IU] via SUBCUTANEOUS
  Administered 2020-06-08: 3 [IU] via SUBCUTANEOUS
  Administered 2020-06-09: 4 [IU] via SUBCUTANEOUS
  Administered 2020-06-09: 3 [IU] via SUBCUTANEOUS
  Administered 2020-06-09 (×2): 4 [IU] via SUBCUTANEOUS
  Administered 2020-06-09 (×2): 3 [IU] via SUBCUTANEOUS
  Administered 2020-06-10 (×2): 4 [IU] via SUBCUTANEOUS
  Administered 2020-06-10: 3 [IU] via SUBCUTANEOUS
  Administered 2020-06-10 – 2020-06-11 (×4): 4 [IU] via SUBCUTANEOUS
  Administered 2020-06-11 (×2): 11 [IU] via SUBCUTANEOUS
  Administered 2020-06-11: 4 [IU] via SUBCUTANEOUS
  Administered 2020-06-11 – 2020-06-12 (×5): 7 [IU] via SUBCUTANEOUS
  Administered 2020-06-12: 4 [IU] via SUBCUTANEOUS
  Administered 2020-06-12 (×2): 7 [IU] via SUBCUTANEOUS
  Administered 2020-06-13: 4 [IU] via SUBCUTANEOUS
  Administered 2020-06-13 (×2): 7 [IU] via SUBCUTANEOUS
  Administered 2020-06-13 – 2020-06-14 (×3): 4 [IU] via SUBCUTANEOUS
  Administered 2020-06-14: 3 [IU] via SUBCUTANEOUS
  Administered 2020-06-14: 4 [IU] via SUBCUTANEOUS
  Filled 2020-06-08 (×37): qty 1

## 2020-06-08 MED ORDER — METHYLPREDNISOLONE SODIUM SUCC 40 MG IJ SOLR: 40.0000 mg | Freq: Two times a day (BID) | INTRAMUSCULAR | Status: DC

## 2020-06-08 NOTE — Progress Notes (Signed)
Wife updated this morning and password established.

## 2020-06-08 NOTE — Procedures (Signed)
Arterial Catheter Insertion Procedure Note  Jermaine Campbell  [id number]  1945-04-26  Date:06/08/20  Time:2:53 AM    Provider Performing: Karen Kays    Procedure: Insertion of Arterial Line [id number]) with US guidance ([id number])   Indication(s) Blood pressure monitoring and/or need for frequent ABGs  Consent Risks of the procedure as well as the alternatives and risks of each were explained to the patient and/or caregiver.  Consent for the procedure was obtained and is signed in the bedside chart  Anesthesia None   Time Out Verified patient identification, verified procedure, site/side was marked, verified correct patient position, special equipment/implants available, medications/allergies/relevant history reviewed, required imaging and test results available.   Sterile Technique Maximal sterile technique including full sterile barrier drape, hand hygiene, sterile gown, sterile gloves, mask, hair covering, sterile ultrasound probe cover (if used).   Procedure Description Area of catheter insertion was cleaned with chlorhexidine and draped in sterile fashion. With real-time ultrasound guidance an arterial catheter was placed into the left femoral artery.  Appropriate arterial tracings confirmed on monitor.     Complications/Tolerance None; patient tolerated the procedure well.   EBL Minimal   Specimen(s) None    Rufina Falco, DNP, CCRN, FNP-C, AGACNP-BC Acute Care Nurse Practitioner  [facility] Pager: [phone] [place] at [facility]

## 2020-06-08 NOTE — ED Provider Notes (Signed)
[facility] Department of Emergency Medicine   Code Blue CONSULT NOTE  Chief Complaint: Cardiac arrest/unresponsive   Level V Caveat: Unresponsive  History of present illness: I was contacted by the hospital for a CODE BLUE cardiac arrest upstairs and presented to the patient's bedside.  Per patient's nurse, patient was in atrial fibrillation on an amiodarone infusion.  She states that his oxygen requirement increased and he was on 3 L and it was increased to 15 L.  He is admitted for COVID-pneumonia.  She states that she was watching him on telemetry when she noticed that he was bradycardic with a heart rate in the 20s.  On her arrival to the room she states patient was unresponsive, pulseless and apneic.  She began CPR.  She states patient did receive 1 round of defibrillation but is unable to tell me what rhythm he was in at that time.  Patient reportedly a full code.  ROS: Unable to obtain, Level V caveat  Scheduled Meds: . dexamethasone  6 mg Oral Q24H  . docusate  100 mg Per Tube BID  . feeding supplement (OSMOLITE 1.5 CAL)  474 mL Per Tube TID  . fentaNYL (SUBLIMAZE) injection  25 mcg Intravenous Once  . levothyroxine  125 mcg Oral Q0600  . metoprolol tartrate  12.5 mg Oral BID  . pantoprazole (PROTONIX) IV  40 mg Intravenous Q24H  . polyethylene glycol  17 g Per Tube Daily  . rivaroxaban  20 mg Per Tube Daily  . simvastatin  40 mg Oral Daily   Continuous Infusions: . amiodarone 30 mg/hr (06/07/20 2316)  . fentaNYL infusion INTRAVENOUS    . norepinephrine (LEVOPHED) Adult infusion    . propofol (DIPRIVAN) infusion    . remdesivir 100 mg in NS 100 mL Stopped (06/07/20 1209)   PRN Meds:.acetaminophen **OR** acetaminophen, albuterol, fentaNYL, midazolam, midazolam Past Medical History:  Diagnosis Date  . Anemia   . Basal cell carcinoma 12/30/2019   L lower pretibia, EDC  . Bradycardia   . 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 Cancer of prostate ([place]) 2019  . Coronary artery disease    atherosclerosis of native coronary artery of native heart without angina pectoris  . Deaf    Left ear  . Dysphagia    history of d/t throat cancer  . Dysrhythmia    paroxysmal atrial fibrillation, bradycardia  . H/O syncope    several yrs ago  . History of BPH   . Hypercholesteremia   . Hypertension   . Hypothyroidism    s/p radiation tx - throat CA  . Melanoma ([place]) 06/11/2018   Melanoma In Situ Posterior neck  . Myocardial infarction ([place])    1996  . Neck stiffness    s/p radiation tx - Throat CA  . Seizures ([place])    last one 2 yrs ago. not medicated. d/t injury to frontal lobe  . Vertigo    no episodes 4-5 yrs/dizziness  . Wears hearing aid    Right ear/ deaf in left ear   Past Surgical History:  Procedure Laterality Date  . BICEPT TENODESIS Right 01/26/2016   Procedure: BICEPS TENODESIS- open;  Surgeon: Corky Mull, MD;  Location: [facility] ORS;  Service: Orthopedics;  Laterality: Right;  . CARDIAC CATHETERIZATION     before CABG/ DR PARACHOS IS CARDIOLOGIST  . CATARACT EXTRACTION W/PHACO Left 07/30/2014   Procedure: CATARACT EXTRACTION PHACO AND  INTRAOCULAR LENS PLACEMENT ([place]) SHUGARCAINE;  Surgeon: Leandrew Koyanagi, MD;  Location: [place];  Service: Ophthalmology;  Laterality: Left;  SHUGARCAINE  . CATARACT EXTRACTION W/PHACO Right 10/29/2014   Procedure: CATARACT EXTRACTION PHACO AND INTRAOCULAR LENS PLACEMENT ([facility]);  Surgeon: Leandrew Koyanagi, MD;  Location: [place];  Service: Ophthalmology;  Laterality: Right;  [place]  . COLONOSCOPY    . COLONOSCOPY WITH PROPOFOL N/A 04/14/2017   Procedure: COLONOSCOPY WITH PROPOFOL;  Surgeon: Manya Silvas, MD;  Location: [facility] ENDOSCOPY;  Service: Endoscopy;  Laterality: N/A;  . CORONARY ARTERY BYPASS GRAFT  1996   3 vessel  . EYE SURGERY Bilateral 2015   cataract extractions  . FRACTURE SURGERY     right  arm. as a child  . HOLEP-LASER ENUCLEATION OF THE PROSTATE WITH MORCELLATION N/A 08/23/2017   Procedure: HOLEP-LASER ENUCLEATION OF THE PROSTATE WITH MORCELLATION;  Surgeon: Hollice Espy, MD;  Location: [facility] ORS;  Service: Urology;  Laterality: N/A;  . OPEN SUBSCAPULARIS REPAIR Right 01/26/2016   Procedure: arthroscopic  SUBSCAPULARIS REPAIR;  Surgeon: Corky Mull, MD;  Location: [facility] ORS;  Service: Orthopedics;  Laterality: Right;  . SHOULDER ARTHROSCOPY WITH OPEN ROTATOR CUFF REPAIR Right 01/26/2016   Procedure: SHOULDER ARTHROSCOPY WITH MINI OPEN ROTATOR CUFF REPAIR;  Surgeon: Corky Mull, MD;  Location: [facility] ORS;  Service: Orthopedics;  Laterality: Right;  . SHOULDER ARTHROSCOPY WITH SUBACROMIAL DECOMPRESSION Right 01/26/2016   Procedure: SHOULDER ARTHROSCOPY WITH SUBACROMIAL DECOMPRESSION and debridement;  Surgeon: Corky Mull, MD;  Location: [facility] ORS;  Service: Orthopedics;  Laterality: Right;  . THROAT SURGERY     excision - cancer  . TONSILLECTOMY     Social History   Socioeconomic History  . Marital status: Married    Spouse name: Not on file  . Number of children: Not on file  . Years of education: Not on file  . Highest education level: Not on file  Occupational History  . Not on file  Tobacco Use  . Smoking status: Never Smoker  . Smokeless tobacco: Never Used  Vaping Use  . Vaping Use: Never used  Substance and Sexual Activity  . Alcohol use: No    Comment: rarely  . Drug use: No  . Sexual activity: Not Currently  Other Topics Concern  . Not on file  Social History Narrative  . Not on file   Social Determinants of Health   Financial Resource Strain: Not on file  Food Insecurity: Not on file  Transportation Needs: Not on file  Physical Activity: Not on file  Stress: Not on file  Social Connections: Not on file  Intimate Partner Violence: Not on file   Allergies  Allergen Reactions  . Tamsulosin Hcl Other (See Comments)    Makes blood pressure drop low and  pass out    Last set of Vital Signs (not current) Vitals:   06/07/20 2149 06/08/20 0009  BP: (!) 145/118   Pulse:    Resp:    Temp: 98.4 F (36.9 C)   SpO2: 90% 91%      Physical Exam  Gen: unresponsive Cardiovascular: pulseless  Resp: apneic. Breath sounds equal bilaterally with bagging; rhonchorous Abd: nondistended  Neuro: GCS 3, unresponsive to pain  HEENT: No blood in posterior pharynx, gag reflex absent; + pink fluid in the posterior oropharynx Neck: No crepitus  Musculoskeletal: No deformity  Skin: warm  Procedures (when applicable, including Critical Care time): Procedures   Cardiopulmonary Resuscitation (CPR) Procedure Note Directed/Performed by: Pryor Curia I  personally directed ancillary staff and/or performed CPR in an effort to regain return of spontaneous circulation and to maintain cardiac, neuro and systemic perfusion.    INTUBATION Performed by: Pryor Curia  Required items: required blood products, implants, devices, and special equipment available Patient identity confirmed: provided demographic data and hospital-assigned identification number Time out: Immediately prior to procedure a "time out" was called to verify the correct patient, procedure, equipment, support staff and site/side marked as required.  Indications: Cardiac arrest, respiratory arrest Intubation method: Glidescope Laryngoscopy   Preoxygenation: BVM  Sedatives: None Paralytic: None  Tube Size: 7.5 cuffed; 25 at teeth  Post-procedure assessment: chest rise and ETCO2 monitor Breath sounds: equal and absent over the epigastrium Tube secured with: ETT holder Chest x-ray interpreted by radiologist and me.  Chest x-ray findings: endotracheal tube in appropriate position  Patient tolerated the procedure well with no immediate complications.   CRITICAL CARE Performed by: Pryor Curia   Total critical care time: 35 minutes  Critical care time was exclusive of separately  billable procedures and treating other patients.  Critical care was necessary to treat or prevent imminent or life-threatening deterioration.  Critical care was time spent personally by me on the following activities: development of treatment plan with patient and/or surrogate as well as nursing, discussions with consultants, evaluation of patient's response to treatment, examination of patient, obtaining history from patient or surrogate, ordering and performing treatments and interventions, ordering and review of laboratory studies, ordering and review of radiographic studies, pulse oximetry and re-evaluation of patient's condition.    MDM / Assessment and Plan On my arrival patient is in PEA and being bagged with BVM.  Patient received 3 rounds of epinephrine, 2 amps of bicarb, intubated with a 7.5 endotracheal tube and we obtained ROSC.  Patient in a sinus rhythm with sinus arrhythmia versus atrial fibrillation.  EKG obtained and shows no ischemia.  Patient hypotensive.  Levophed started.  Blood sugar in the 300s.  ICU team assuming care.   I reviewed all nursing notes and pertinent previous records as available.  I have reviewed and interpreted any EKGs, lab and urine results, imaging (as available).     Jerrell Hart, Delice Bison, DO 06/08/20 0117

## 2020-06-08 NOTE — Progress Notes (Signed)
Pt arrived to unit Assessment completed as charted.  Able to ambulate from stretcher to bed 3L [place] Oxygen with ambulation 88% Once at rest oxygen increased to 92% Pt alert and oriented. Denies any pain.  Productive cough. Patient provided emesis bag for sputum.  Amio gtt and IV fluids infusing.  Wife called by patient and updated on plan of care Time allowed for questions and concerns Pt and spouse denies any additional questions or concerns at this time Call bell within reach  Will continue to closely monitor.

## 2020-06-08 NOTE — Progress Notes (Signed)
Inpatient Diabetes Program Recommendations  AACE/ADA: New Consensus Statement on Inpatient Glycemic Control (2015)  Target Ranges:  Prepandial:   less than 140 mg/dL      Peak postprandial:   less than 180 mg/dL (1-2 hours)      Critically ill patients:  140 - 180 mg/dL   Lab Results  Component Value Date   GLUCAP 161 (H) 06/08/2020   HGBA1C 6.3 (H) 06/07/2020    Review of Glycemic Control Results for Jermaine Campbell, Jermaine "DAVE" (MRN [id number]) as of 06/08/2020 09:23  Ref. Range 06/08/2020 00:29 06/08/2020 01:06 06/08/2020 04:41 06/08/2020 07:55  Glucose-Capillary Latest Ref Range: 70 - 99 mg/dL 312 (H) 319 (H) 310 (H) 161 (H)   Diabetes history: DM 2 Outpatient Diabetes medications:  Metformin 500 mg bid Current orders for Inpatient glycemic control:  Novolog resistant q 4 hours Osmolite 474 ml per tube tid Lantus 15 units daily Solumedrol 75.7 mg q 12 hours Inpatient Diabetes Program Recommendations:    Will follow.  May need tube feed insulin coverage as well.   Thanks,  Adah Perl, RN, BC-ADM Inpatient Diabetes Coordinator Pager [phone] (8a-5p)

## 2020-06-08 NOTE — Progress Notes (Signed)
Initial Nutrition Assessment  DOCUMENTATION CODES:   Non-severe (moderate) malnutrition in context of chronic illness  INTERVENTION:   Vital HP @45ml /hr- Initiate at 2ml/hr and increase by 29ml/hr q 12 hours until goal rate is reached.   Pro-Source 31ml TID via tube, provides 40kcal and 11g of protein per serving   Propofol: 18.17 ml/hr- provides 480kcal/day   Free water flushes 64ml q4 hours to maintain tube patency   Regimen provides 1680kcal/day, 128g/day protein and 1062ml/day free water   Pt at high refeed risk; recommend monitor potassium, magnesium and phosphorus labs daily until stable  NUTRITION DIAGNOSIS:   Moderate Malnutrition related to cancer and cancer related treatments as evidenced by mild fat depletion,moderate fat depletion,mild muscle depletion,moderate muscle depletion.  GOAL:   Patient will meet greater than or equal to 90% of their needs  MONITOR:   Vent status,Labs,Weight trends,TF tolerance,Skin,I & O's  REASON FOR ASSESSMENT:   Consult Enteral/tube feeding initiation and management  ASSESSMENT:   75 y.o. 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   Pt sedated and ventilated. PEG tube in place. Pt is currently proned. Plan is to initiate tube feeds once patient is turned. Pt is likely at high refeed risk. Pt with chronic PEG tube secondary to esophageal stenosis. RD will inquire about home tube feed regimen once patient is more stable. Per  chart, pt appears weight stable pta.    Medications reviewed and include: colace, insulin, solu-cortef, synthroid, protonix, miralax, fentanyl, levophed, propofol, vasopressin   Labs reviewed: K 4.9 wnl, BUN 54(H), creat 1.38(H), P 6.3(H), Mg 2.4 wnl, AST 204(H), ALT 178(H) Wbc- 11.2(H), Hgb 9.1(L), Hct 29.9(L) cbgs- 312, 319, 310, 161, 130 x 24 hrs AIC 6.3(H)- 5/15  Patient is currently intubated on ventilator support MV: 9.4 L/min Temp (24hrs), Avg:97.4 F (36.3 C), Min:95 F (35 C), Max:100.4 F (38 C)  Propofol: 18.17 ml/hr- provides 480kcal/day   MAP >1mmHg  UOP- 712ml   NUTRITION - FOCUSED PHYSICAL EXAM:  Flowsheet Row Most Recent Value  Orbital Region No depletion  Upper Arm Region Moderate depletion  Thoracic and Lumbar Region Unable to assess  Buccal Region Mild depletion  Temple Region Moderate depletion  Clavicle Bone Region Moderate depletion  Clavicle and Acromion Bone Region Moderate depletion  Scapular Bone Region Moderate depletion  Dorsal Hand Mild depletion  Patellar Region Mild depletion  Anterior Thigh Region Mild depletion  Posterior Calf Region Mild depletion  Edema (RD Assessment) Mild  Hair Reviewed  Eyes Reviewed  Mouth Reviewed  Skin Reviewed  Nails Reviewed     Diet Order:   Diet Order            Diet NPO time specified  Diet effective now                EDUCATION NEEDS:   No education needs have been identified at this time  Skin:  Skin Assessment: Reviewed RN Assessment  Last BM:  5/16  Height:   Ht Readings from Last 1 Encounters:  06/08/20 5\' 9"  (1.753 m)    Weight:   Wt Readings from Last  1 Encounters:  06/08/20 76.9 kg    Ideal Body Weight:  72.7 kg  BMI:  Body mass index is 25.04 kg/m.  Estimated Nutritional Needs:   Kcal:  1538kcal/day  Protein:  115-130g/day  Fluid:  1.8-2.1L/day  Koleen Distance MS, RD, LDN Please refer to [facility] for RD and/or RD on-call/weekend/after hours pager

## 2020-06-08 NOTE — Progress Notes (Signed)
PT Cancellation Note  Patient Details Name: Jermaine Campbell MRN: [id number] DOB: [birth year]   Cancelled Treatment:    Reason Eval/Treat Not Completed: Medical issues which prohibited therapy (Per chart review, patient transferred to CCU due to decline in medical status.  Per guidelines, will require new order to resume PT services. Please re-consult as medically appropriate.)  Harrison Zetina H. Owens Shark, PT, DPT, NCS 06/08/20, 9:41 AM [phone]

## 2020-06-08 NOTE — Progress Notes (Signed)
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  Cardioverted in ED for a fib RVR and on ventilator now in ICU.   Camera: HR 101, 90% sats, 137/109 420/10/18/100% In sinus. On Levo at 18.  Data: Reviewed notes, labs, meds. EKG and CxR; no evidence of T wave or ST changes. Chest x-ray showed bilateral airspace opacities, right greater than left, without associated consolidation with findings suggestive of multifocal pneumonia of atypical etiology,while showing no evidence of pulmonary edema, effusion, or pneumothorax.   A/P 1. AHRF from Covid PNA. ARDS 2. A fib RVR on AC at home. S/p cardioversion to sinus 3. AKI improving. Anemia. No bleeding. PEG status. Wbc normal.  4. Hypothyroidism  eICU Interventions  - continue antiviral watch LFT - sedation - Vent bundle - BG goals < 180. ssi - VTE: on AC - prn ABG. Consider prone if getting worse. Neutral to neg balance     Intervention Category Major Interventions: Respiratory failure - evaluation and management Evaluation Type: New Patient Evaluation  Elmer Sow 06/08/2020, 1:07 AM

## 2020-06-08 NOTE — Consult Note (Signed)
NAME:  Jermaine Campbell, MRN:  [id number], DOB:  [birth year], LOS: 2 ADMISSION DATE:  05/31/2020, CONSULTATION DATE:  06/08/20 REFERRING MD: Jonny Ruiz, NP, CHIEF COMPLAINT: Shortness of breath  History of Present Illness:  75 year old male presenting to [facility] ED from home with complaints of progressive shortness of breath over the past 5 days with associated new onset of nonproductive cough, subjective fever/chills.  Patient underwent outpatient testing at [facility] on 06/05/2020 and was found to be positive for COVID-19.  Due to subsequent further progression of symptoms he arrived at [facility] ED for further evaluation. ED course:  Patient found to be in SVT at 195 receiving 2 doses of IV adenosine which then revealed A. fib RVR.  Dr. Nehemiah Massed with cardiology recommended electrocardioversion which was completed successfully in the ED and ensuing sinus rhythm.  Cardiology additionally recommended the initiation of an amiodarone bolus followed by continuous amiodarone and diltiazem drips.  Initial vitals: T 99, tachypneic at 36, tachycardic 195, BP 158/127 with SPO2 at 88% on room air Significant labs: BUN 48, CRP 29.4, PCT 1.10, D-dimer 2.21, fibrinogen greater than 800.  Chest x-ray showing bilateral airspace opacities greater on the right without consolidation concerning for multifocal atypical pneumonia and mild underlying interstitial edema possible. TRH consulted for admission to PCU.  Hospital course: Patient started on dexamethasone and remdesivir as well as bronchodilators.  Amiodarone and diltiazem drips continued with home Xarelto for atrial fibrillation.  Overnight on 06/07/2020 per nursing patient flipped into A. fib RVR with heart rate in the 170s and associated hypoxia requiring non rebreather.  Subsequently the patient became bradycardic with heart rate in the 20s and when nursing arrived bedside to assess the patient and found him pulseless, unresponsive and apneic.  CPR was initiated and  per nursing patient was defibrillated once before code team arrived, unclear what rhythm might have been at that time.  Upon CODE BLUE team arrival patient was in PEA and being bagged with BVM.  The patient received 3 rounds of epinephrine, 2 A of bicarb with Levophed drip started.  Patient was emergently intubated and ROSC was obtained with post rhythm sinus with sinus arrhythmia.  Patient urgently transferred to ICU with PCCM consulted.  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 06/01/2020-patient admitted to PCU COVID-19 positive with Pneumonia & A. fib RVR requiring amiodarone and diltiazem drips status post cardioversion. . 06/08/20-overnight patient developed A. fib RVR, became hypoxic and ultimately bradycardic before PEA arrest staying requiring emergent intubation and mechanical ventilation with transfer to ICU  Interim History / Subjective:  Patient intubated & sedated  Objective   Blood pressure (!) 145/118, pulse 80, temperature 98.4 F (36.9 C), temperature source Oral, resp. rate 20, height 5\' 9"  (1.753 m), weight 75.7 kg, SpO2 91 %.        Intake/Output Summary (Last 24 hours) at 06/08/2020 0044 Last data filed at 06/07/2020 1916 Gross per 24 hour  Intake 631.25 ml  Output 950 ml  Net -318.75 ml   Filed Weights   06/22/2020 2055 06/07/20 1600  Weight: 76 kg 75.7 kg    Examination: General: Adult male, critically ill, lying in bed intubated &  sedated requiring mechanical ventilation, NAD HEENT: MM pink/moist, anicteric, atraumatic, neck supple Neuro: Intubated and sedated,  unable to follow commands, PERRL +3 CV: s1s2 RRR, NSR/SA on monitor, no r/m/g Pulm: Regular, non labored on 100% FiO2/PEEP 10, breath sounds coarse-BUL & diminished-BLL GI: soft, rounded, bs x 4 GU: foley in place with clear yellow urine Skin: no rashes/lesions noted Extremities: warm/dry, pulses + 2 R/P, no edema noted   Labs/imaging that I have personally reviewed  (right click and "Reselect all SmartList Selections" daily)  EKG Interpretation Date:  06/08/2020 EKG Time:  0044 Rate:  100 Rhythm: NSR with PVCs & PACs QRS Axis:  Normal Intervals: Prolonged QTC at 516 ST/T Wave abnormalities: None Narrative Interpretation: Sinus rhythm with PVCs and PACs, prolonged QTC  Net: +500 mL Na+/ K+: 140/4.4 BUN/Cr.:  51/0.99 Serum CO2/ AG: 21/18  Hgb: 8.2  WBC/ TMAX: 5.2/36.9 PCT: 1.10  CXR 06/08/2020: Diffuse interstitial opacity, small pleural effusions Resolved Hospital Problem list     Assessment & Plan:  Acute Hypoxic Respiratory Failure secondary to COVID-19 Bacterial Pneumonia  PMHx:  - Ventilator settings: PRVC  6 mL/kg, FiO2: 100%, PEEP 10, continue lung protective strategies  - Wean PEEP & FiO2 as tolerated to maintain O2 sats >88% - Goal plateau pressure < 30, driving pressure < 15 - Consider continuous paralytics if needed for vent synchrony/gas exchange - Cycle prone positioning if P/F ratio < for oxygenation - Deep sedation per PAD protocol, goal RASS -4: Fentanyl & Propofol drips - Vecuronium IVP Q 1h PRN for ventilator asynchrony - Diuresis as tolerated, goal CVP 5-8.   - VAP prevention order set -Continue remdesivir, plan for 5 days - methlyprednisolone 1 mg/kg x 3 days, followed by a taper to 40 mg twice daily - Follow inflammatory markers: Ferritin, D-dimer, CRP, LDH - continue Vitamin C & zinc - f/u cultures, monitor WBC/ fever curve, trend PCT - Intermittent CXR & ABG PRN  PEA arrest with shock -Continue Levophed & vasopressin drip to maintain MAP  greater than 65 - Echocardiogram ordered -Central line and arterial line placed -Lasix given x1 due to pulmonary edema and clinical picture and on CXR Chronic Atrial Fibrillation with Rapid Ventricular Response PMHx: Atrial fibrillation Plan to continue amiodarone infusion for 18 hours then changed to p.o. amio per cardiology, will maintain amiodarone drip until a.m. rounds due to CODE BLUE -Continue amiodarone infusion, consider transitioning to p.o. amiodarone as patient stabilizes -Continue Xarelto - Cardiology consulted, appreciate input - Continuous cardiac monitoring  Steroid-induced hyperglycemia in the setting of T2DM -CBG monitor every 4 with SSI coverage -Initiate Lantus 15 units daily -Follow ICU hypo-/hyperglycemia protocol  Hypothyroidism -Continue Synthroid  Best practice (right click and "Reselect all SmartList Selections" daily)  Diet:  NPO Pain/Anxiety/Delirium protocol (if indicated): Yes (RASS goal -2) VAP protocol (if indicated): Yes DVT prophylaxis: Systemic AC GI prophylaxis: PPI Glucose control:  SSI Yes Central venous access:  Yes, and it is still needed Arterial line:  Yes, and it is still needed Foley:  Yes, and it is still needed Mobility:  bed rest  PT consulted: N/A Last date of multidisciplinary goals of care discussion 06/07/2020 Code Status:  full code Disposition: ICU  Labs   CBC: Recent Labs  Lab 06/11/2020 2120 06/07/20 0423  WBC 4.5 5.2  NEUTROABS 3.9 4.7  HGB 10.0* 8.2*  HCT 32.1* 26.3*  MCV 87.7 88.0  PLT 144* 103*    Basic Metabolic Panel: Recent Labs  Lab 06/13/2020 2120 06/07/20 0423  NA  135 137  K 4.2 4.4  CL 97* 102  CO2 25 24  GLUCOSE 213* 229*  BUN 48* 42*  CREATININE 1.06 0.89  CALCIUM 9.1 8.1*  MG 2.2 1.9  PHOS  --  2.6   GFR: Estimated Creatinine Clearance: 69.5 mL/min (by C-G formula based on SCr of 0.89 mg/dL). Recent Labs  Lab 06/07/2020 2120 06/07/20 0423  PROCALCITON 1.10  --   WBC 4.5 5.2     Liver Function Tests: Recent Labs  Lab 06/07/20 0423  AST 41  ALT 18  ALKPHOS 51  BILITOT 0.5  PROT 6.7  ALBUMIN 2.6*   No results for input(s): LIPASE, AMYLASE in the last 168 hours. No results for input(s): AMMONIA in the last 168 hours.  ABG No results found for: PHART, PCO2ART, PO2ART, HCO3, TCO2, ACIDBASEDEF, O2SAT   Coagulation Profile: No results for input(s): INR, PROTIME in the last 168 hours.  Cardiac Enzymes: No results for input(s): CKTOTAL, CKMB, CKMBINDEX, TROPONINI in the last 168 hours.  HbA1C: No results found for: HGBA1C  CBG: Recent Labs  Lab 06/08/20 0029  GLUCAP 312*    Review of Systems:   Patient intubated and sedated unable to participate  Past Medical History:  He,  has a past medical history of Anemia, Basal cell carcinoma (12/30/2019), Bradycardia, Cancer ([place]) (2005), Cancer of prostate ([place]) (2019), Coronary artery disease, Deaf, Dysphagia, Dysrhythmia, H/O syncope, History of BPH, Hypercholesteremia, Hypertension, Hypothyroidism, Melanoma ([place]) (06/11/2018), Myocardial infarction [facility]), Neck stiffness, Seizures ([place]), Vertigo, and Wears hearing aid.   Surgical History:   Past Surgical History:  Procedure Laterality Date  . BICEPT TENODESIS Right 01/26/2016   Procedure: BICEPS TENODESIS- open;  Surgeon: Corky Mull, MD;  Location: [facility] ORS;  Service: Orthopedics;  Laterality: Right;  . CARDIAC CATHETERIZATION     before CABG/ DR PARACHOS IS CARDIOLOGIST  . CATARACT EXTRACTION W/PHACO Left 07/30/2014   Procedure: CATARACT EXTRACTION PHACO AND INTRAOCULAR LENS PLACEMENT ([place]) SHUGARCAINE;  Surgeon: Leandrew Koyanagi, MD;  Location: [place];  Service: Ophthalmology;  Laterality: Left;  SHUGARCAINE  . CATARACT EXTRACTION W/PHACO Right 10/29/2014   Procedure: CATARACT EXTRACTION PHACO AND INTRAOCULAR LENS PLACEMENT ([facility]);  Surgeon: Leandrew Koyanagi, MD;  Location: [place];  Service: Ophthalmology;  Laterality:  Right;  [place]  . COLONOSCOPY    . COLONOSCOPY WITH PROPOFOL N/A 04/14/2017   Procedure: COLONOSCOPY WITH PROPOFOL;  Surgeon: Manya Silvas, MD;  Location: [facility] ENDOSCOPY;  Service: Endoscopy;  Laterality: N/A;  . CORONARY ARTERY BYPASS GRAFT  1996   3 vessel  . EYE SURGERY Bilateral 2015   cataract extractions  . FRACTURE SURGERY     right arm. as a child  . HOLEP-LASER ENUCLEATION OF THE PROSTATE WITH MORCELLATION N/A 08/23/2017   Procedure: HOLEP-LASER ENUCLEATION OF THE PROSTATE WITH MORCELLATION;  Surgeon: Hollice Espy, MD;  Location: [facility];  Service: Urology;  Laterality: N/A;  . OPEN SUBSCAPULARIS REPAIR Right 01/26/2016   Procedure: arthroscopic  SUBSCAPULARIS REPAIR;  Surgeon: Corky Mull, MD;  Location: [facility];  Service: Orthopedics;  Laterality: Right;  . SHOULDER ARTHROSCOPY WITH OPEN ROTATOR CUFF REPAIR Right 01/26/2016   Procedure: SHOULDER ARTHROSCOPY WITH MINI OPEN ROTATOR CUFF REPAIR;  Surgeon: Corky Mull, MD;  Location: [facility];  Service: Orthopedics;  Laterality: Right;  . SHOULDER ARTHROSCOPY WITH SUBACROMIAL DECOMPRESSION Right 01/26/2016   Procedure: SHOULDER ARTHROSCOPY WITH SUBACROMIAL DECOMPRESSION and debridement;  Surgeon: Corky Mull, MD;  Location: [facility];  Service: Orthopedics;  Laterality: Right;  .  THROAT SURGERY     excision - cancer  . TONSILLECTOMY       Social History:   reports that he has never smoked. He has never used smokeless tobacco. He reports that he does not drink alcohol and does not use drugs.   Family History:  His family history includes Heart attack in his father.   Allergies Allergies  Allergen Reactions  . Tamsulosin Hcl Other (See Comments)    Makes blood pressure drop low and pass out     Home Medications  Prior to Admission medications   Medication Sig Start Date End Date Taking? Authorizing Provider  atorvastatin (LIPITOR) 80 MG tablet Take 1 tablet by mouth daily. 04/04/20  Yes [provider]   predniSONE (DELTASONE) 10 MG tablet 2 tabs daily for 4 days, 1 tab daily for 4 days 06/05/20  Yes [provider]  amoxicillin-clavulanate (AUGMENTIN) 875-125 MG tablet Take 1 tablet by mouth in the morning and at bedtime. For 7 days. 06/05/20 06/12/20  [provider]  aspirin EC 81 MG tablet Take 81 mg by mouth daily. Swallow whole.    [provider]  levothyroxine (SYNTHROID) 125 MCG tablet Take 1 tablet by mouth daily. Take on an empty stomach with a glass of water at least 30 to 60 minutes before breakfast. 05/07/20 05/07/21  [provider]  levothyroxine (SYNTHROID, LEVOTHROID) 100 MCG tablet Take 100 mcg by mouth daily before breakfast. Patient not taking: Reported on 06/07/2020    [provider]  metFORMIN (GLUCOPHAGE) 500 MG tablet Place 500 mg into feeding tube 2 (two) times daily with a meal.    [provider]  metoprolol tartrate (LOPRESSOR) 25 MG tablet Take 12.5 mg by mouth 2 (two) times daily.     [provider]  pantoprazole sodium (PROTONIX) 40 mg/20 mL PACK Take 20 mLs by mouth daily. 05/10/20 06/09/20  [provider]  simvastatin (ZOCOR) 40 MG tablet Take 40 mg by mouth daily.  Patient not taking: Reported on 06/07/2020    [provider]  vitamin B-12 (CYANOCOBALAMIN) 1000 MCG tablet Take 1,000 mcg by mouth daily.    [provider]  XARELTO 20 MG TABS tablet Take 20 mg by mouth daily with supper. 09/10/18   [provider]     Critical care time: 75 minutes    Venetia Night, AGACNP-BC Acute Care Nurse Practitioner [facility]   [phone] / [phone] Please see Amion for pager details.   Attending attestation:  The patient was seen and examined in conjunction with [place], NP.  All aspects of the assessment and plan have been agreed upon as above.  Patient is critically ill due to severe COVID-pneumonia, acute  respiratory failure with hypoxia and COVID ARDS.  He is currently proned and responding to these maneuvers.  Patient was discussed during multidisciplinary rounds.   Patient's daughters were updated in full.  Continue supportive care.  Remdesivir has been discontinued due to ration of hepatic enzymes.   Renold Don, MD [place] [facility]   *This note was dictated using voice recognition software/Dragon.  Despite best efforts to proofread, errors can occur which can change the meaning.  Any change was purely unintentional.

## 2020-06-08 NOTE — Plan of Care (Signed)
Neuro: RASS -5 while proned on fentanyl and propofol Resp: stable on ventilator settings, FiO2 weaned  CV: vital signs stable, vasopressin weaned off, Levo weaned down, intermittent Bair huggar use required to maintain temperature GIGU: foley in place, no BM, nothing per tube due to inaccessibility from prone position-D5 LR started Skin: clean dry and intact, all bony processes protected with foam dressings while proned, diaphoretic throughout the shift  Social: Daughters Almyra Free and Jodie have visited today, all questions and concerns addressed, they have been communicating with the patient's wife regarding his status. Neither daughter have entered the room so there is no designated visitor yet.  The wife would also like to know when she can visit, she is currently on Day 5 after testing positive for COVID.   Events: Patient prone throughout the day.  Problem: Education: Goal: Knowledge of risk factors and measures for prevention of condition will improve Outcome: Not Progressing   Problem: Coping: Goal: Psychosocial and spiritual needs will be supported Outcome: Not Progressing   Problem: Respiratory: Goal: Will maintain a patent airway Outcome: Not Progressing Goal: Complications related to the disease process, condition or treatment will be avoided or minimized Outcome: Not Progressing   Problem: Education: Goal: Knowledge of General Education information will improve Description: Including pain rating scale, medication(s)/side effects and non-pharmacologic comfort measures Outcome: Not Progressing   Problem: Health Behavior/Discharge Planning: Goal: Ability to manage health-related needs will improve Outcome: Not Progressing   Problem: Clinical Measurements: Goal: Ability to maintain clinical measurements within normal limits will improve Outcome: Not Progressing Goal: Will remain free from infection Outcome: Not Progressing Goal: Diagnostic test results will improve Outcome:  Not Progressing Goal: Respiratory complications will improve Outcome: Not Progressing Goal: Cardiovascular complication will be avoided Outcome: Not Progressing   Problem: Activity: Goal: Risk for activity intolerance will decrease Outcome: Not Progressing   Problem: Nutrition: Goal: Adequate nutrition will be maintained Outcome: Not Progressing   Problem: Coping: Goal: Level of anxiety will decrease Outcome: Not Progressing   Problem: Elimination: Goal: Will not experience complications related to bowel motility Outcome: Not Progressing Goal: Will not experience complications related to urinary retention Outcome: Not Progressing   Problem: Pain Managment: Goal: General experience of comfort will improve Outcome: Not Progressing   Problem: Safety: Goal: Ability to remain free from injury will improve Outcome: Not Progressing   Problem: Skin Integrity: Goal: Risk for impaired skin integrity will decrease Outcome: Not Progressing

## 2020-06-08 NOTE — Progress Notes (Addendum)
Date and time results received: 06/08/20 0635  Test: Lactic Acid Critical Value: 2.5  Name of Provider Notified: Britton-Lee, NP  Orders Received? Or Actions Taken? Improved from previous 3.8

## 2020-06-08 NOTE — Progress Notes (Signed)
Assisted tele visit to patient with wife.  Ife Vitelli Harold, RN  

## 2020-06-08 NOTE — Progress Notes (Signed)
Pt proned 2' abg..subsequent abg drawn w/ improvement noted. No other changes to note. Will continue as order.

## 2020-06-08 NOTE — Progress Notes (Addendum)
0020 - I noticed on telemetry that patients heart rate had dropped into the 20s. Ran straight to pt room to find him with no pulse, apneic, and unresponsive. CPR started and code blue called.

## 2020-06-08 NOTE — Progress Notes (Signed)
   06/08/20 0100  Clinical Encounter Type  Visited With Patient  Visit Type Initial;Spiritual support  Referral From Nurse  Consult/Referral To Chaplain  Spiritual Encounters  Spiritual Needs Prayer;Emotional  Stress Factors  Family Stress Factors Health changes  Chaplain Devereaux Grayson responded to a RR in room 241, Pt Jermaine Campbell. Medical team worked on pt. Pt is stable at the present time and have been moved to ICU 19. No family members were present at the time. I provided prayer, emotional and spiritual support.

## 2020-06-08 NOTE — Procedures (Signed)
Central Venous Catheter Insertion Procedure Note  Jermaine Campbell  [id number]  08/09/1945  Date:06/08/20  Time:2:51 AM   Provider Performing:Yohann Curl A Laszlo Ellerby   Procedure: Insertion of Non-tunneled Central Venous [phone]) with US guidance (53614)   Indication(s) Medication administration and Difficult access  Consent Risks of the procedure as well as the alternatives and risks of each were explained to the patient and/or caregiver.  Consent for the procedure was obtained and is signed in the bedside chart  Anesthesia Topical only with 1% lidocaine   Timeout Verified patient identification, verified procedure, site/side was marked, verified correct patient position, special equipment/implants available, medications/allergies/relevant history reviewed, required imaging and test results available.  Sterile Technique Maximal sterile technique including full sterile barrier drape, hand hygiene, sterile gown, sterile gloves, mask, hair covering, sterile ultrasound probe cover (if used).  Procedure Description Area of catheter insertion was cleaned with chlorhexidine and draped in sterile fashion.  With real-time ultrasound guidance a central venous catheter was placed into the left femoral vein. Nonpulsatile blood flow and easy flushing noted in all ports.  The catheter was sutured in place and sterile dressing applied.  Complications/Tolerance None; patient tolerated the procedure well. Chest X-ray is ordered to verify placement for internal jugular or subclavian cannulation.   Chest x-ray is not ordered for femoral cannulation.  EBL Minimal  Specimen(s) None    Jermaine Falco, DNP, CCRN, FNP-C, AGACNP-BC Acute Care Nurse Practitioner  [facility] Pager: [phone] [place] [facility]

## 2020-06-09 ENCOUNTER — Inpatient Hospital Stay
Admit: 2020-06-09 | Discharge: 2020-06-09 | Disposition: A | Discharge status: Home / Self Care | Payer: Medicare Other | Attending: Pulmonary Disease | Admitting: Pulmonary Disease

## 2020-06-09 DIAGNOSIS — J1282 Pneumonia due to coronavirus disease 2019: Secondary | ICD-10-CM | POA: Diagnosis not present

## 2020-06-09 DIAGNOSIS — E440 Moderate protein-calorie malnutrition: Secondary | ICD-10-CM | POA: Insufficient documentation

## 2020-06-09 DIAGNOSIS — U071 COVID-19: Secondary | ICD-10-CM | POA: Diagnosis not present

## 2020-06-09 LAB — ECHOCARDIOGRAM COMPLETE
AR max vel: 1.27 cm2
AV Area VTI: 1.42 cm2
AV Area mean vel: 1.26 cm2
AV Mean grad: 11.7 mmHg
AV Peak grad: 19.2 mmHg
Ao pk vel: 2.19 m/s
Area-P 1/2: 6.37 cm2
Height: 69.016 in
S' Lateral: 3.47 cm
Weight: 2712.54 oz

## 2020-06-09 LAB — BASIC METABOLIC PANEL
Anion gap: 9 (ref 5–15)
BUN: 64 mg/dL — ABNORMAL HIGH (ref 8–23)
CO2: 28 mmol/L (ref 22–32)
Calcium: 7.7 mg/dL — ABNORMAL LOW (ref 8.9–10.3)
Chloride: 107 mmol/L (ref 98–111)
Creatinine, Ser: 1.53 mg/dL — ABNORMAL HIGH (ref 0.61–1.24)
GFR, Estimated: 47 mL/min — ABNORMAL LOW (ref >60)
Glucose, Bld: 216 mg/dL — ABNORMAL HIGH (ref 70–99)
Potassium: 4.5 mmol/L (ref 3.5–5.1)
Sodium: 144 mmol/L (ref 135–145)

## 2020-06-09 LAB — CBC
HCT: 27.9 % — ABNORMAL LOW (ref 39.0–52.0)
Hemoglobin: 8.5 g/dL — ABNORMAL LOW (ref 13.0–17.0)
MCH: 27 pg (ref 26.0–34.0)
MCHC: 30.5 g/dL (ref 30.0–36.0)
MCV: 88.6 fL (ref 80.0–100.0)
Platelets: 172 10*3/uL (ref 150–400)
RBC: 3.15 MIL/uL — ABNORMAL LOW (ref 4.22–5.81)
RDW: 19.3 % — ABNORMAL HIGH (ref 11.5–15.5)
WBC: 10.3 10*3/uL (ref 4.0–10.5)
nRBC: 0.7 % — ABNORMAL HIGH (ref 0.0–0.2)

## 2020-06-09 LAB — BLOOD GAS, ARTERIAL
Acid-Base Excess: 3.9 mmol/L — ABNORMAL HIGH (ref 0.0–2.0)
Bicarbonate: 30.9 mmol/L — ABNORMAL HIGH (ref 20.0–28.0)
FIO2: 0.4
MECHVT: 420 mL
O2 Saturation: 97.7 %
PEEP: 8 cmH2O
Patient temperature: 37
RATE: 22 resp/min
pCO2 arterial: 60 mmHg — ABNORMAL HIGH (ref 32.0–48.0)
pH, Arterial: 7.32 — ABNORMAL LOW (ref 7.350–7.450)
pO2, Arterial: 106 mmHg (ref 83.0–108.0)

## 2020-06-09 LAB — GLUCOSE, CAPILLARY
Glucose-Capillary: 125 mg/dL — ABNORMAL HIGH (ref 70–99)
Glucose-Capillary: 126 mg/dL — ABNORMAL HIGH (ref 70–99)
Glucose-Capillary: 137 mg/dL — ABNORMAL HIGH (ref 70–99)
Glucose-Capillary: 181 mg/dL — ABNORMAL HIGH (ref 70–99)
Glucose-Capillary: 187 mg/dL — ABNORMAL HIGH (ref 70–99)
Glucose-Capillary: 193 mg/dL — ABNORMAL HIGH (ref 70–99)
Glucose-Capillary: 200 mg/dL — ABNORMAL HIGH (ref 70–99)

## 2020-06-09 LAB — PROCALCITONIN: Procalcitonin: 2.66 ng/mL

## 2020-06-09 LAB — BRAIN NATRIURETIC PEPTIDE: B Natriuretic Peptide: 1174 pg/mL — ABNORMAL HIGH (ref 0.0–100.0)

## 2020-06-09 LAB — PHOSPHORUS
Phosphorus: 5.2 mg/dL — ABNORMAL HIGH (ref 2.5–4.6)
Phosphorus: 4.1 mg/dL (ref 2.5–4.6)

## 2020-06-09 LAB — MAGNESIUM
Magnesium: 2.6 mg/dL — ABNORMAL HIGH (ref 1.7–2.4)
Magnesium: 2.6 mg/dL — ABNORMAL HIGH (ref 1.7–2.4)

## 2020-06-09 MED ORDER — PHENYLEPHRINE CONCENTRATED 100MG/250ML (0.4 MG/ML) INFUSION SIMPLE
0.0000 ug/min | INTRAVENOUS | Status: DC
  Administered 2020-06-09: 200 ug/min via INTRAVENOUS
  Administered 2020-06-09: 175 ug/min via INTRAVENOUS
  Filled 2020-06-09 (×2): qty 250

## 2020-06-09 MED ORDER — INSULIN GLARGINE 100 UNIT/ML ~~LOC~~ SOLN
10.0000 [IU] | Freq: Every day | SUBCUTANEOUS | Status: DC
  Administered 2020-06-09 – 2020-06-12 (×4): 10 [IU] via SUBCUTANEOUS
  Filled 2020-06-09 (×5): qty 0.1

## 2020-06-09 MED ORDER — PROSOURCE TF PO LIQD
45.0000 mL | Freq: Three times a day (TID) | ORAL | Status: DC
  Administered 2020-06-09 (×2): 45 mL
  Filled 2020-06-09: qty 45

## 2020-06-09 MED ORDER — DEXMEDETOMIDINE HCL IN NACL 400 MCG/100ML IV SOLN
0.4000 ug/kg/h | INTRAVENOUS | Status: DC
  Administered 2020-06-09 – 2020-06-10 (×3): 0.4 ug/kg/h via INTRAVENOUS
  Filled 2020-06-09 (×3): qty 100

## 2020-06-09 MED ORDER — DIGOXIN 0.25 MG/ML IJ SOLN
0.1250 mg | Freq: Once | INTRAMUSCULAR | Status: AC
  Administered 2020-06-09: 0.125 mg via INTRAVENOUS
  Filled 2020-06-09: qty 2

## 2020-06-09 MED ORDER — FUROSEMIDE 10 MG/ML IJ SOLN
20.0000 mg | Freq: Once | INTRAMUSCULAR | Status: AC
  Administered 2020-06-09: 20 mg via INTRAVENOUS
  Filled 2020-06-09: qty 2

## 2020-06-09 MED ORDER — FREE WATER
30.0000 mL | Status: DC
  Administered 2020-06-09 – 2020-06-11 (×12): 30 mL

## 2020-06-09 MED ORDER — METOPROLOL TARTRATE 5 MG/5ML IV SOLN
5.0000 mg | Freq: Once | INTRAVENOUS | Status: AC
  Administered 2020-06-09: 5 mg via INTRAVENOUS
  Filled 2020-06-09: qty 5

## 2020-06-09 NOTE — Progress Notes (Signed)
*  PRELIMINARY RESULTS* Echocardiogram 2D Echocardiogram has been performed.  Jermaine Campbell 06/09/2020, 8:54 AM

## 2020-06-09 NOTE — Progress Notes (Signed)
NAME:  Jermaine Campbell, MRN:  [id number], DOB:  [birth year], LOS: 3 ADMISSION DATE:  05/31/2020, CONSULTATION DATE:  06/08/20 REFERRING MD: Jonny Ruiz, NP, CHIEF COMPLAINT: Shortness of breath  History of Present Illness:  75 year old male presenting to [facility] ED from home with complaints of progressive shortness of breath over the past 5 days with associated new onset of nonproductive cough, subjective fever/chills.  Patient underwent outpatient testing at [facility] on 06/05/2020 and was found to be positive for COVID-19.  Due to subsequent further progression of symptoms he arrived at [facility] ED for further evaluation. ED course:  Patient found to be in SVT at 195 receiving 2 doses of IV adenosine which then revealed A. fib RVR.  Dr. Nehemiah Massed with cardiology recommended electrocardioversion which was completed successfully in the ED and ensuing sinus rhythm.  Cardiology additionally recommended the initiation of an amiodarone bolus followed by continuous amiodarone and diltiazem drips.  Initial vitals: T 99, tachypneic at 36, tachycardic 195, BP 158/127 with SPO2 at 88% on room air Significant labs: BUN 48, CRP 29.4, PCT 1.10, D-dimer 2.21, fibrinogen greater than 800.  Chest x-ray showing bilateral airspace opacities greater on the right without consolidation concerning for multifocal atypical pneumonia and mild underlying interstitial edema possible. TRH consulted for admission to PCU.  Hospital course: Patient started on dexamethasone and remdesivir as well as bronchodilators.  Amiodarone and diltiazem drips continued with home Xarelto for atrial fibrillation.  Overnight on 06/07/2020 per nursing patient flipped into A. fib RVR with heart rate in the 170s and associated hypoxia requiring non rebreather.  Subsequently the patient became bradycardic with heart rate in the 20s and when nursing arrived bedside to assess the patient and found him pulseless, unresponsive and apneic.  CPR was initiated and  per nursing patient was defibrillated once before code team arrived, unclear what rhythm might have been at that time.  Upon CODE BLUE team arrival patient was in PEA and being bagged with BVM.  The patient received 3 rounds of epinephrine, 2 A of bicarb with Levophed drip started.  Patient was emergently intubated and ROSC was obtained with post rhythm sinus with sinus arrhythmia.  Patient urgently transferred to ICU with PCCM consulted.  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 06/18/2020-patient admitted to PCU COVID-19 positive with Pneumonia & A. fib RVR requiring amiodarone and diltiazem drips status post cardioversion. . 06/08/20-overnight patient developed A. fib RVR, became hypoxic and ultimately bradycardic before PEA arrest staying requiring emergent intubation and mechanical ventilation with transfer to ICU . 06/09/2020: Currently supine, has tolerated well.  FiO2 requirements down.  Cardiac rhythm still erratic LVEF 40 to 45% down from 2019.    Interim History / Subjective:  Patient intubated & sedated Now supine, tolerating  Objective   Blood pressure (!) 87/64, pulse (!) 103, temperature 98.6 F (37 C), resp. rate (!) 22, height 5' 9.02" (1.753 m), weight 76.9 kg, SpO2 98 %.    Vent Mode: PRVC FiO2 (%):  [40 %-70 %] 40 % Set Rate:  [22 bmp] 22 bmp Vt Set:  [420 mL] 420 mL PEEP:  [8 cmH20-10 cmH20] 8 cmH20   Intake/Output Summary (Last  24 hours) at 06/09/2020 0939 Last data filed at 06/09/2020 0600 Gross per 24 hour  Intake 2255.12 ml  Output 730 ml  Net 1525.12 ml   Filed  Weights   06/02/2020 2055 06/07/20 1600 06/08/20 0100  Weight: 76 kg 75.7 kg 76.9 kg    Examination: Physical examination is limited due to need for PPE/CAPR General: Adult male, critically ill, lying in bed intubated & sedated requiring mechanical ventilation, NAD HEENT: MM pink/moist, anicteric, atraumatic, neck supple Neuro: Intubated and sedated, unable to follow commands, PERRL +3 CV: A. fib on monitor, no r/m/g Pulm: Regular, non labored on 40 % FiO2/PEEP 8, breath sounds coarse-BUL & diminished-BLL GI: soft, rounded, bs x 4 GU: foley in place with clear yellow urine Skin: no rashes/lesions noted Extremities: warm/dry, pulses + 2 R/P, no edema noted   Labs/imaging that I have personally reviewed  (right click and "Reselect all SmartList Selections" daily)  EKG Interpretation Date:  06/08/2020 EKG Time:  0044 Rate:  100 Rhythm: NSR with PVCs & PACs QRS Axis:  Normal Intervals: Prolonged QTC at 516 ST/T Wave abnormalities: None Narrative Interpretation: Sinus rhythm with PVCs and PACs, prolonged QTC   CXR 06/08/2020: Diffuse interstitial opacity, small pleural effusions Resolved Hospital Problem list     Assessment & Plan:  Acute Hypoxic Respiratory Failure secondary to COVID-19 Bacterial Pneumonia  PMHx:  - Ventilator settings: PRVC  6 mL/kg, FiO2: 100%, PEEP 10, continue lung protective strategies  - Wean PEEP & FiO2 as tolerated to maintain O2 sats >88% - Goal plateau pressure < 30, driving pressure < 15 - Consider continuous paralytics if needed for vent synchrony/gas exchange - Cycle prone positioning if P/F ratio < for oxygenation - Decreasing RASS goal to -1 to -2: Fentanyl & Precedex l drips, wean off propofol - Vecuronium IVP Q 1h PRN for ventilator asynchrony - Diuresis as tolerated, goal CVP 5-8.   - Continue VAP prevention protocol - Remdesivir discontinued yesterday due to hepatic dysfunction - Currently on Solu-Cortef for mineralocorticoid activity in the  setting of shock - Follow inflammatory markers: Ferritin, D-dimer, CRP, LDH - continue Vitamin C & zinc - f/u cultures, monitor WBC/ fever curve, trend PCT - Intermittent CXR & ABG PRN  PEA arrest with shock -Continue Levophed & vasopressin drip to maintain MAP greater than 65 -Echocardiogram shows LVEF 40 to 45%, global HK, grade 1 DD, biatrial enlargement -Central line and arterial line present -Lasix as needed   Chronic Atrial Fibrillation with Rapid Ventricular Response PMHx: Atrial fibrillation Plan to continue amiodarone infusion for 18 hours then changed to p.o. amio per cardiology, will maintain amiodarone drip until a.m. rounds due to CODE BLUE - Continue amiodarone infusion, consider transitioning to p.o. amiodarone as patient stabilizes - Continue Xarelto - Cardiology consulted, appreciate input - Continuous cardiac monitoring  Steroid-induced hyperglycemia in the setting of T2DM -CBG monitor every 4 with SSI coverage -Lantus 15 units daily -Follow ICU hypo-/hyperglycemia protocol  Hypothyroidism -Continue Synthroid  Nutrition - Tube feeds initiated via G-tube  Best practice (right click and "Reselect all SmartList Selections" daily)  Diet:  NPO Pain/Anxiety/Delirium protocol (if indicated): Yes (RASS goal -2) VAP protocol (if indicated): Yes DVT prophylaxis: Systemic AC GI prophylaxis: PPI Glucose control:  SSI Yes Central venous access:  Yes, and it is still needed Arterial line:  Yes, and it is still needed Foley:  Yes, and it is still needed Mobility:  bed rest  PT consulted: N/A Last date of multidisciplinary goals of care discussion 06/07/2020  Code Status:  full code Disposition: ICU  Labs   CBC: Recent Labs  Lab 06/05/2020 2120 06/07/20 0423 06/08/20 0251 06/09/20 0628  WBC 4.5 5.2 11.2* 10.3  NEUTROABS 3.9 4.7 10.3*  --   HGB 10.0* 8.2* 9.1* 8.5*  HCT 32.1* 26.3* 29.9* 27.9*  MCV 87.7 88.0 90.3 88.6  PLT 144* 103* 192 172    Basic  Metabolic Panel: Recent Labs  Lab 05/31/2020 2120 06/07/20 0423 06/08/20 0251 06/08/20 2140 06/09/20 0628  NA 135 140  137 139  --  144  K 4.2 4.4  4.4 4.9  --  4.5  CL 97* 101  102 101  --  107  CO2 25 21*  24 27  --  28  GLUCOSE 213* 216*  229* 399*  --  216*  BUN 48* 51*  42* 54*  --  64*  CREATININE 1.06 0.99  0.89 1.38*  --  1.53*  CALCIUM 9.1 9.1  8.1* 7.7*  --  7.7*  MG 2.2 1.9 2.4 2.5* 2.6*  PHOS  --  2.6 6.3* 5.7* 5.2*   GFR: Estimated Creatinine Clearance: 40.4 mL/min (A) (by C-G formula based on SCr of 1.53 mg/dL (H)). Recent Labs  Lab 05/25/2020 2120 06/07/20 0423 06/08/20 0251 06/08/20 0551 06/08/20 2140 06/09/20 0628  PROCALCITON 1.10  --  0.80  --   --  2.66  WBC 4.5 5.2 11.2*  --   --  10.3  LATICACIDVEN  --   --  3.8* 2.5* 1.2  --     Liver Function Tests: Recent Labs  Lab 06/07/20 0423 06/08/20 0251  AST 48*  41 204*  ALT 21  18 178*  ALKPHOS 58  51 58  BILITOT 0.5  0.5 0.5  PROT 7.8  6.7 6.3*  ALBUMIN 3.3*  2.6* 2.3*   No results for input(s): LIPASE, AMYLASE in the last 168 hours. No results for input(s): AMMONIA in the last 168 hours.  ABG    Component Value Date/Time   PHART 7.32 (L) 06/09/2020 0005   PCO2ART 60 (H) 06/09/2020 0005   PO2ART 106 06/09/2020 0005   HCO3 30.9 (H) 06/09/2020 0005   ACIDBASEDEF 0.2 06/08/2020 0551   O2SAT 97.7 06/09/2020 0005     Coagulation Profile: Recent Labs  Lab 06/08/20 0251  INR 1.2    Cardiac Enzymes: No results for input(s): CKTOTAL, CKMB, CKMBINDEX, TROPONINI in the last 168 hours.  HbA1C: Hgb A1c MFr Bld  Date/Time Value Ref Range Status  06/07/2020 04:23 AM 6.3 (H) 4.8 - 5.6 % Final    Comment:    (NOTE) Pre diabetes:          5.7%-6.4%  Diabetes:              >6.4%  Glycemic control for   <7.0% adults with diabetes     CBG: Recent Labs  Lab 06/08/20 1941 06/08/20 2337 06/09/20 0348 06/09/20 0352 06/09/20 0739  GLUCAP 99 182* 200* 193* 187*    Review  of Systems:   Patient intubated and sedated unable to participate    Allergies Allergies  Allergen Reactions  . Tamsulosin Hcl Other (See Comments)    Makes blood pressure drop low and pass out     Scheduled Meds: . vitamin C  500 mg Per Tube Daily  . chlorhexidine gluconate (MEDLINE KIT)  15 mL Mouth Rinse BID  . Chlorhexidine Gluconate Cloth  6 each Topical Daily  . docusate  100 mg Per Tube BID  .  feeding supplement (PROSource TF)  45 mL Per Tube TID  . feeding supplement (VITAL HIGH PROTEIN)  1,000 mL Per Tube Q24H  . free water  30 mL Per Tube Q4H  . hydrocortisone sod succinate (SOLU-CORTEF) inj  100 mg Intravenous Q6H  . insulin aspart  0-20 Units Subcutaneous Q4H  . insulin glargine  10 Units Subcutaneous Daily  . levothyroxine  125 mcg Per Tube Q0600  . mouth rinse  15 mL Mouth Rinse 10 times per day  . pantoprazole (PROTONIX) IV  40 mg Intravenous Q24H  . polyethylene glycol  17 g Per Tube Daily  . rivaroxaban  15 mg Per Tube Daily  . simvastatin  40 mg Per Tube Daily  . zinc sulfate  220 mg Per Tube Daily   Continuous Infusions: . amiodarone 30 mg/hr (06/09/20 0600)  . fentaNYL infusion INTRAVENOUS 150 mcg/hr (06/09/20 0918)  . phenylephrine (NEO-SYNEPHRINE) Adult infusion 200 mcg/min (06/09/20 0600)  . propofol (DIPRIVAN) infusion 40 mcg/kg/min (06/09/20 0600)  . vasopressin Stopped (06/08/20 1624)   PRN Meds:.acetaminophen **OR** acetaminophen, albuterol, fentaNYL, midazolam, midazolam   Critical care time: 40 minutes    Patient was discussed during multidisciplinary rounds.  Updated 2 daughters in person.   The patient is critically ill with multiple organ systems failure and requires high complexity decision making for assessment and support, frequent evaluation and titration of therapies, application of advanced monitoring technologies and extensive interpretation of multiple databases. Critical Care Time devoted to patient care services described in  this note is 40  minutes.   Renold Don, MD [facility]   *This note was dictated using voice recognition software/Dragon.  Despite best efforts to proofread, errors can occur which can change the meaning.  Any change was purely unintentional.

## 2020-06-09 NOTE — Progress Notes (Signed)
[facility] Encounter Note  Patient: Jermaine Campbell / Admit Date: 06/15/2020 / Date of Encounter: 06/09/2020, 1:44 PM   Subjective: Patient had admission to the hospital due to COVID-pneumonia for which she had an episode of atrial fibrillation with rapid ventricular rate in the 180 bpm range.  At that time decisions were made to use amiodarone infusion as well as electrical cardioversion to manage atrial fibrillation.  He was converted into normal sinus rhythm and improved.  Then on the 16th the patient had an episode PEA of unknown etiology for which the patient had a heart rate of apparently 20 bpm.  All medication management and ACLS protocol was followed and the patient then was placed on appropriate pressor support.  Since then his heart rate has been in and out of atrial fibrillation with 120 bpm currently relatively stable from the hemodynamically standpoint with some continued oxygen agitation issues.  BNP elevated consistent with current situation listed above.  Echocardiogram from showing mild global LV systolic dysfunction most consistent with atrial fibrillation rather than acute congestive heart failure with ejection fraction of 40 to 45%   Objective: Telemetry: Atrial fibrillation with variable heart rate and hypotension Physical Exam: Blood pressure (!) 87/64, pulse (!) 118, temperature 98.96 F (37.2 C), resp. rate (!) 22, height 5' 9.02" (1.753 m), weight 76.9 kg, SpO2 98 %. Body mass index is 25.02 kg/m.     Intake/Output Summary (Last 24 hours) at 06/09/2020 1344 Last data filed at 06/09/2020 1051 Gross per 24 hour  Intake 1907.54 ml  Output 1240 ml  Net 667.54 ml    Inpatient Medications:  . vitamin C  500 mg Per Tube Daily  . chlorhexidine gluconate (MEDLINE KIT)  15 mL Mouth Rinse BID  . Chlorhexidine Gluconate Cloth  6 each Topical Daily  . docusate  100 mg Per Tube BID  . feeding supplement (PROSource TF)  45 mL Per Tube TID  . feeding  supplement (VITAL HIGH PROTEIN)  1,000 mL Per Tube Q24H  . free water  30 mL Per Tube Q4H  . hydrocortisone sod succinate (SOLU-CORTEF) inj  100 mg Intravenous Q6H  . insulin aspart  0-20 Units Subcutaneous Q4H  . insulin glargine  10 Units Subcutaneous Daily  . levothyroxine  125 mcg Per Tube Q0600  . mouth rinse  15 mL Mouth Rinse 10 times per day  . pantoprazole (PROTONIX) IV  40 mg Intravenous Q24H  . polyethylene glycol  17 g Per Tube Daily  . rivaroxaban  15 mg Per Tube Daily  . simvastatin  40 mg Per Tube Daily  . zinc sulfate  220 mg Per Tube Daily   Infusions:  . amiodarone 30 mg/hr (06/09/20 1223)  . dexmedetomidine (PRECEDEX) IV infusion 0.4 mcg/kg/hr (06/09/20 1051)  . fentaNYL infusion INTRAVENOUS 150 mcg/hr (06/09/20 0918)  . phenylephrine (NEO-SYNEPHRINE) Adult infusion 175 mcg/min (06/09/20 1252)  . propofol (DIPRIVAN) infusion 40 mcg/kg/min (06/09/20 0600)  . vasopressin Stopped (06/08/20 1624)    Labs: Recent Labs    06/08/20 0251 06/08/20 2140 06/09/20 0628  NA 139  --  144  K 4.9  --  4.5  CL 101  --  107  CO2 27  --  28  GLUCOSE 399*  --  216*  BUN 54*  --  64*  CREATININE 1.38*  --  1.53*  CALCIUM 7.7*  --  7.7*  MG 2.4 2.5* 2.6*  PHOS 6.3* 5.7* 5.2*   Recent Labs    06/07/20 0423  06/08/20 0251  AST 48*  41 204*  ALT 21  18 178*  ALKPHOS 58  51 58  BILITOT 0.5  0.5 0.5  PROT 7.8  6.7 6.3*  ALBUMIN 3.3*  2.6* 2.3*   Recent Labs    06/07/20 0423 06/08/20 0251 06/09/20 0628  WBC 5.2 11.2* 10.3  NEUTROABS 4.7 10.3*  --   HGB 8.2* 9.1* 8.5*  HCT 26.3* 29.9* 27.9*  MCV 88.0 90.3 88.6  PLT 103* 192 172   No results for input(s): CKTOTAL, CKMB, TROPONINI in the last 72 hours. Invalid input(s): POCBNP Recent Labs    06/07/20 0423  HGBA1C 6.3*     Weights: Filed Weights   06/07/2020 2055 06/07/20 1600 06/08/20 0100  Weight: 76 kg 75.7 kg 76.9 kg     Radiology/Studies:  DG ABDOMEN PEG TUBE LOCATION  Result Date:  05/15/2020 CLINICAL DATA:  Status post PEG tube placement EXAM: ABDOMEN - 1 VIEW COMPARISON:  10/16/2019 FINDINGS: Supine portable view of the abdomen. Gastrostomy tube balloon projects over the body of the stomach. Contrast opacifies the stomach and duodenum. No gross extravasation. IMPRESSION: Gastrostomy tube overlies the body of the stomach. No gross extravasation. Electronically Signed   By: Donavan Foil M.D.   On: 05/15/2020 20:28   DG Chest Port 1 View  Result Date: 06/08/2020 CLINICAL DATA:  Check endotracheal tube placement EXAM: PORTABLE CHEST 1 VIEW COMPARISON:  04/08/2020 FINDINGS: Cardiac shadow is stable. Aortic calcifications are again seen. Endotracheal tube is again noted and stable. Postsurgical change diffuse bilateral airspace opacity consistent with congestive failure and edema. No pneumothorax is noted. IMPRESSION: Chronic changes of CHF. Endotracheal tube in noted in satisfactory position. Electronically Signed   By: Inez Catalina M.D.   On: 06/08/2020 22:18   DG Chest Port 1 View  Result Date: 06/08/2020 CLINICAL DATA:  Intubation. EXAM: PORTABLE CHEST 1 VIEW COMPARISON:  Two days ago FINDINGS: Endotracheal tube with tip below the clavicular heads. Diffuse interstitial opacity with cephalized blood flow and Kerley lines. Small pleural effusions. Mild cardiomegaly. CABG. IMPRESSION: 1. Endotracheal tube in unremarkable position. 2. Extensive and symmetric opacity favoring CHF Electronically Signed   By: Monte Fantasia M.D.   On: 06/08/2020 04:04   DG Chest Portable 1 View  Result Date: 06/03/2020 CLINICAL DATA:  Shortness of breath.  COVID-19 positive EXAM: PORTABLE CHEST 1 VIEW COMPARISON:  February 13, 2007 FINDINGS: There is ill-defined airspace opacity in the right mid lung and both lung bases. Equivocal increased opacity left mid lung. Heart is upper normal in size with pulmonary vascularity normal. Patient is status post coronary artery bypass grafting. No adenopathy. There  is aortic atherosclerosis. No bone lesions IMPRESSION: Ill-defined airspace opacity, more on the right than on the left without consolidation. Suspect multifocal pneumonia, likely of atypical organism etiology. A degree of mild underlying interstitial edema is possible. Status post coronary artery bypass grafting. Heart upper normal in size. Aortic Atherosclerosis (ICD10-I70.0). Electronically Signed   By: Lowella Grip III M.D.   On: 06/13/2020 21:52   ECHOCARDIOGRAM COMPLETE  Result Date: 06/09/2020    ECHOCARDIOGRAM REPORT   Patient Name:   Jermaine Campbell Date of Exam: 06/09/2020 Medical Rec #:  [id number]        Height:       69.0 in Accession #:    [id number]       Weight:       169.5 lb Date of Birth:  07/18/1945        BSA:  1.926 m Patient Age:    32 years         BP:           110/58 mmHg Patient Gender: M                HR:           103 bpm. Exam Location:  [facility] Procedure: 2D Echo, Color Doppler and Cardiac Doppler Indications:     Cardiac arrest I45.9  History:         Patient has no prior history of Echocardiogram examinations.                  Previous Myocardial Infarction; Risk Factors:Hypertension.  Sonographer:     Sherrie Sport RDCS (AE) Referring Phys:  [id number] [place] L [place] Diagnosing Phys: Bartholome Bill MD  Sonographer Comments: Echo performed with patient supine and on artificial respirator. IMPRESSIONS  1. Left ventricular ejection fraction, by estimation, is 40 to 45%. The left ventricle has mildly decreased function. The left ventricle demonstrates global hypokinesis. There is moderate left ventricular hypertrophy. Left ventricular diastolic parameters are consistent with Grade I diastolic dysfunction (impaired relaxation).  2. Right ventricular systolic function is normal. The right ventricular size is mildly enlarged.  3. Left atrial size was mildly dilated.  4. Right atrial size was mildly dilated.  5. The mitral valve is grossly normal. Trivial mitral valve  regurgitation.  6. The aortic valve is calcified. Aortic valve regurgitation is trivial. Mild to moderate aortic valve sclerosis/calcification is present, without any evidence of aortic stenosis. FINDINGS  Left Ventricle: Left ventricular ejection fraction, by estimation, is 40 to 45%. The left ventricle has mildly decreased function. The left ventricle demonstrates global hypokinesis. The left ventricular internal cavity size was normal in size. There is  moderate left ventricular hypertrophy. Left ventricular diastolic parameters are consistent with Grade I diastolic dysfunction (impaired relaxation). Right Ventricle: The right ventricular size is mildly enlarged. No increase in right ventricular wall thickness. Right ventricular systolic function is normal. Left Atrium: Left atrial size was mildly dilated. Right Atrium: Right atrial size was mildly dilated. Pericardium: There is no evidence of pericardial effusion. Mitral Valve: The mitral valve is grossly normal. Trivial mitral valve regurgitation. Tricuspid Valve: The tricuspid valve is not well visualized. Tricuspid valve regurgitation is trivial. Aortic Valve: The aortic valve is calcified. Aortic valve regurgitation is trivial. Mild to moderate aortic valve sclerosis/calcification is present, without any evidence of aortic stenosis. Aortic valve mean gradient measures 11.7 mmHg. Aortic valve peak gradient measures 19.2 mmHg. Aortic valve area, by VTI measures 1.42 cm. Pulmonic Valve: The pulmonic valve was not assessed. Pulmonic valve regurgitation is not visualized. Aorta: The aortic root is normal in size and structure. IAS/Shunts: The interatrial septum was not assessed.  LEFT VENTRICLE PLAX 2D LVIDd:         4.09 cm LVIDs:         3.47 cm LV PW:         1.40 cm LV IVS:        1.74 cm LVOT diam:     2.15 cm LV SV:         45 LV SV Index:   24 LVOT Area:     3.63 cm  LEFT ATRIUM             Index       RIGHT ATRIUM           Index LA diam:  4.00 cm  2.08 cm/m  RA Area:     19.80 cm LA Vol (A2C):   46.8 ml 24.30 ml/m RA Volume:   55.50 ml  28.82 ml/m LA Vol (A4C):   50.8 ml 26.38 ml/m LA Biplane Vol: 52.3 ml 27.16 ml/m  AORTIC VALVE                    PULMONIC VALVE AV Area (Vmax):    1.27 cm     PV Vmax:        0.60 m/s AV Area (Vmean):   1.26 cm     PV Peak grad:   1.4 mmHg AV Area (VTI):     1.42 cm     RVOT Peak grad: 2 mmHg AV Vmax:           219.00 cm/s AV Vmean:          152.000 cm/s AV VTI:            0.320 m AV Peak Grad:      19.2 mmHg AV Mean Grad:      11.7 mmHg LVOT Vmax:         76.40 cm/s LVOT Vmean:        52.700 cm/s LVOT VTI:          0.125 m LVOT/AV VTI ratio: 0.39  AORTA Ao Root diam: 3.40 cm MITRAL VALVE               TRICUSPID VALVE MV Area (PHT): 6.37 cm    TR Peak grad:   9.2 mmHg MV Decel Time: 119 msec    TR Vmax:        152.00 cm/s MV E velocity: 97.30 cm/s                            SHUNTS                            Systemic VTI:  0.12 m                            Systemic Diam: 2.15 cm Bartholome Bill MD Electronically signed by Bartholome Bill MD Signature Date/Time: 06/09/2020/12:10:29 PM    Final      Assessment and Recommendation  75 y.o. male with known coronary disease as recorded bypass graft and acute paroxysmal nonvalvular atrial fibrillation exacerbated by acute COVID-pneumonia status post PEA and ACLS protocol now with variable pressures needing pressure support and intermittent paroxysmal atrial fibrillation awaiting for improvements with hypoxia and current infection 1.  Continue supportive care for significant.  COVID-pneumonia hypoxia and all other issues that are related 2.  Continue pressure support due to hypotension from 3.  No additional medication management for atrial fibrillation at this time due to concerns of hypotension and will continue amiodarone at this time for possible maintenance of normal sinus rhythm if able and will also help with the potential for heart rate control 4.  Heparin for  further risk reduction stroke with atrial fibrillation as well as for COVID-pneumonia Signed, Serafina Royals M.D. FACC

## 2020-06-09 NOTE — TOC Initial Note (Signed)
Transition of Care [facility]) - Initial/Assessment Note    Patient Details  Name: Jermaine Campbell MRN: [id number] Date of Birth: [birth year]  Transition of [facility]) CM/SW Contact:    Ova Freshwater Phone Number: [phone] 06/09/2020, 4:23 PM  Clinical Narrative:                  Patient presents to [facility] due to SOB, tested positive for COVID on 06/05/2020. Patient's main contact is Nuttall,Jodie D (Spouse) [phone] [facility]).  Pateint has PT/OT consult but is not medically stable for evaluation.  TOC will continue to follow.  Expected Discharge Plan: [place] Barriers to Discharge: Continued Medical Work up   Patient Goals and CMS Choice        Expected Discharge Plan and Services Expected Discharge Plan: [place] In-house Referral: Clinical Social Work     Living arrangements for the past 2 months: [place]                                      Prior Living Arrangements/Services Living arrangements for the past 2 months: [place] with:: Spouse (Tutterow,Jodie D (Spouse)   [phone] (Mobile) Patient language and need for interpreter reviewed:: Yes Do you feel safe going back to the place where you live?: Yes      Need for Family Participation in Patient Care: Yes (Comment) Care giver support system in place?: Yes (comment)   Criminal Activity/Legal Involvement Pertinent to Current Situation/Hospitalization: No - Comment as needed  Activities of Daily Living      Permission Sought/Granted Permission sought to share information with : Facility Sport and exercise psychologist    Share Information with NAME: Alandis, Bluemel (Spouse)   [phone] (Mobile           Emotional Assessment Appearance:: Appears stated age Attitude/Demeanor/Rapport: Unable to Assess Affect (typically observed): Unable to Assess Orientation: : Oriented to Self,Oriented to Place,Oriented to Situation Alcohol /  Substance Use: Not Applicable Psych Involvement: No (comment)  Admission diagnosis:  Hypoxia [R09.02] Atrial fibrillation with RVR ([place]) [I48.91] COVID-19 virus infection [U07.1] COVID-19 [U07.1] Patient Active Problem List   Diagnosis Date Noted  . Malnutrition of moderate degree 06/09/2020  . Atrial fibrillation with RVR ([place]) 06/07/2020  . Shortness of breath 06/07/2020  . Generalized weakness 06/07/2020  . AKI (acute kidney injury) ([place]) 06/07/2020  . Pneumonia due to COVID-19 virus July 06, 2020  . Erectile dysfunction due to arterial insufficiency 11/23/2019  . Bradycardia 09/14/2018  . Status post right rotator cuff repair 07/16/2018  . 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 Nonrheumatic aortic valve stenosis 11/23/2017  . Prostate cancer ([place]) 08/08/2017  . Pharyngeal dysphagia 06/28/2017  . Paroxysmal atrial fibrillation ([place]) 01/04/2017  . Elevated PSA 12/28/2016  . BPH with obstruction/lower urinary tract symptoms 12/28/2016  . Oral lesion 06/08/2016  . Carpal tunnel syndrome of right wrist 05/06/2016  . Complete tear of right rotator cuff 12/04/2015  . Rotator cuff tendinitis, right 12/04/2015  . Spondylosis of cervical region without myelopathy or radiculopathy 12/04/2015  . Benign essential hypertension 08/05/2013  . CAD (coronary artery disease), native coronary artery 08/05/2013  . Hearing loss 08/05/2013  . Other specified cardiac arrhythmias 08/05/2013  . Hyperlipidemia, mixed 11/14/2012  . Increased frequency of urination 11/14/2012  .  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 Nocturia 11/14/2012  . Malignant neoplasm of pharynx, unspecified ([place]) 11/14/2012  . Status post chemoradiation 11/14/2012  . Acquired hypothyroidism 01/09/2012   PCP:  Marinda Elk, MD Pharmacy:   [facility], [place] [address] Phone: [phone] Fax: [phone]     Social Determinants of Health (SDOH) Interventions    Readmission Risk Interventions No flowsheet data found.

## 2020-06-10 ENCOUNTER — Inpatient Hospital Stay: Payer: Medicare Other

## 2020-06-10 DIAGNOSIS — U071 COVID-19: Secondary | ICD-10-CM | POA: Diagnosis not present

## 2020-06-10 DIAGNOSIS — J1282 Pneumonia due to coronavirus disease 2019: Secondary | ICD-10-CM | POA: Diagnosis not present

## 2020-06-10 LAB — MAGNESIUM
Magnesium: 2.5 mg/dL — ABNORMAL HIGH (ref 1.7–2.4)
Magnesium: 2.5 mg/dL — ABNORMAL HIGH (ref 1.7–2.4)

## 2020-06-10 LAB — CBC WITH DIFFERENTIAL/PLATELET
Abs Immature Granulocytes: 0.02 10*3/uL (ref 0.00–0.07)
Basophils Absolute: 0 10*3/uL (ref 0.0–0.1)
Basophils Relative: 0 %
Eosinophils Absolute: 0 10*3/uL (ref 0.0–0.5)
Eosinophils Relative: 0 %
HCT: 25.9 % — ABNORMAL LOW (ref 39.0–52.0)
Hemoglobin: 8.2 g/dL — ABNORMAL LOW (ref 13.0–17.0)
Immature Granulocytes: 0 %
Lymphocytes Relative: 4 %
Lymphs Abs: 0.2 10*3/uL — ABNORMAL LOW (ref 0.7–4.0)
MCH: 27.4 pg (ref 26.0–34.0)
MCHC: 31.7 g/dL (ref 30.0–36.0)
MCV: 86.6 fL (ref 80.0–100.0)
Monocytes Absolute: 0.3 10*3/uL (ref 0.1–1.0)
Monocytes Relative: 5 %
Neutro Abs: 5.3 10*3/uL (ref 1.7–7.7)
Neutrophils Relative %: 91 %
Platelets: 122 10*3/uL — ABNORMAL LOW (ref 150–400)
RBC: 2.99 MIL/uL — ABNORMAL LOW (ref 4.22–5.81)
RDW: 19.6 % — ABNORMAL HIGH (ref 11.5–15.5)
Smear Review: NORMAL
WBC: 5.9 10*3/uL (ref 4.0–10.5)
nRBC: 0.9 % — ABNORMAL HIGH (ref 0.0–0.2)

## 2020-06-10 LAB — BASIC METABOLIC PANEL
Anion gap: 9 (ref 5–15)
Anion gap: 11 (ref 5–15)
BUN: 62 mg/dL — ABNORMAL HIGH (ref 8–23)
BUN: 68 mg/dL — ABNORMAL HIGH (ref 8–23)
CO2: 29 mmol/L (ref 22–32)
CO2: 27 mmol/L (ref 22–32)
Calcium: 7.9 mg/dL — ABNORMAL LOW (ref 8.9–10.3)
Calcium: 8 mg/dL — ABNORMAL LOW (ref 8.9–10.3)
Chloride: 110 mmol/L (ref 98–111)
Chloride: 109 mmol/L (ref 98–111)
Creatinine, Ser: 1.16 mg/dL (ref 0.61–1.24)
Creatinine, Ser: 1.21 mg/dL (ref 0.61–1.24)
GFR, Estimated: >60 mL/min (ref >60)
GFR, Estimated: >60 mL/min (ref >60)
Glucose, Bld: 158 mg/dL — ABNORMAL HIGH (ref 70–99)
Glucose, Bld: 215 mg/dL — ABNORMAL HIGH (ref 70–99)
Potassium: 3.5 mmol/L (ref 3.5–5.1)
Potassium: 3.8 mmol/L (ref 3.5–5.1)
Sodium: 148 mmol/L — ABNORMAL HIGH (ref 135–145)
Sodium: 147 mmol/L — ABNORMAL HIGH (ref 135–145)

## 2020-06-10 LAB — GLUCOSE, CAPILLARY
Glucose-Capillary: 123 mg/dL — ABNORMAL HIGH (ref 70–99)
Glucose-Capillary: 152 mg/dL — ABNORMAL HIGH (ref 70–99)
Glucose-Capillary: 153 mg/dL — ABNORMAL HIGH (ref 70–99)
Glucose-Capillary: 166 mg/dL — ABNORMAL HIGH (ref 70–99)
Glucose-Capillary: 180 mg/dL — ABNORMAL HIGH (ref 70–99)
Glucose-Capillary: 198 mg/dL — ABNORMAL HIGH (ref 70–99)

## 2020-06-10 LAB — PHOSPHORUS
Phosphorus: 2.7 mg/dL (ref 2.5–4.6)
Phosphorus: 3.4 mg/dL (ref 2.5–4.6)

## 2020-06-10 LAB — PROCALCITONIN: Procalcitonin: 1.33 ng/mL

## 2020-06-10 MED ORDER — ROCURONIUM BROMIDE 50 MG/5ML IV SOLN
50.0000 mg | Freq: Once | INTRAVENOUS | Status: AC
  Administered 2020-06-10: 50 mg via INTRAVENOUS

## 2020-06-10 MED ORDER — FENTANYL CITRATE (PF) 100 MCG/2ML IJ SOLN
100.0000 ug | Freq: Once | INTRAMUSCULAR | Status: AC
  Administered 2020-06-10: 100 ug via INTRAVENOUS

## 2020-06-10 MED ORDER — LACTATED RINGERS IV BOLUS: 250.0000 mL | Freq: Once | INTRAVENOUS | Status: DC

## 2020-06-10 MED ORDER — PROSOURCE TF PO LIQD
45.0000 mL | Freq: Two times a day (BID) | ORAL | Status: DC
  Administered 2020-06-10 – 2020-06-11 (×3): 45 mL
  Filled 2020-06-10: qty 45

## 2020-06-10 MED ORDER — NOREPINEPHRINE 4 MG/250ML-% IV SOLN
INTRAVENOUS | Status: AC
  Filled 2020-06-10: qty 250

## 2020-06-10 MED ORDER — ETOMIDATE 2 MG/ML IV SOLN
20.0000 mg | Freq: Once | INTRAVENOUS | Status: AC
  Administered 2020-06-10: 20 mg via INTRAVENOUS
  Filled 2020-06-10: qty 10

## 2020-06-10 MED ORDER — VECURONIUM BROMIDE 10 MG IV SOLR
10.0000 mg | INTRAVENOUS | Status: DC | PRN
  Administered 2020-06-10 (×2): 10 mg via INTRAVENOUS
  Filled 2020-06-10 (×3): qty 10

## 2020-06-10 MED ORDER — STERILE WATER FOR INJECTION IJ SOLN
INTRAMUSCULAR | Status: AC
  Filled 2020-06-10: qty 10

## 2020-06-10 MED ORDER — VITAL AF 1.2 CAL PO LIQD
1000.0000 mL | ORAL | Status: DC
  Administered 2020-06-10 – 2020-06-13 (×4): 1000 mL

## 2020-06-10 MED ORDER — DIGOXIN 0.25 MG/ML IJ SOLN
0.1250 mg | INTRAMUSCULAR | Status: AC
  Administered 2020-06-10: 0.125 mg via INTRAVENOUS
  Filled 2020-06-10: qty 2

## 2020-06-10 MED ORDER — FENTANYL CITRATE (PF) 100 MCG/2ML IJ SOLN
INTRAMUSCULAR | Status: AC
  Filled 2020-06-10: qty 2

## 2020-06-10 MED ORDER — MIDAZOLAM 50MG/50ML (1MG/ML) PREMIX INFUSION
0.5000 mg/h | INTRAVENOUS | Status: DC
  Administered 2020-06-10: 0.5 mg/h via INTRAVENOUS
  Administered 2020-06-11: 2 mg/h via INTRAVENOUS
  Filled 2020-06-10 (×2): qty 50

## 2020-06-10 MED ORDER — ROCURONIUM BROMIDE 50 MG/5ML IV SOLN
INTRAVENOUS | Status: AC
  Filled 2020-06-10: qty 1

## 2020-06-10 NOTE — Progress Notes (Signed)
Patient alarming on vent Low minute vol, RT at bedside, stated it is because of patient RR, patient appears to have shallow small respirations. Fentanyl and Versed bolus giving and increased rates, with no effect. Patient given Vecuronium with also no effect. Hinton Dyer NP made aware.

## 2020-06-10 NOTE — Progress Notes (Signed)
[facility] Cardiology [facility] Encounter Note  Patient: [name] / Admit Date: 06/18/2020 / Date of Encounter: 06/10/2020, 8:14 AM   Subjective: 5/17.  Patient had admission to the hospital due to COVID-pneumonia for which she had an episode of atrial fibrillation with rapid ventricular rate in the 180 bpm range.  At that time decisions were made to use amiodarone infusion as well as electrical cardioversion to manage atrial fibrillation.  He was converted into normal sinus rhythm and improved.  Then on the 16th the patient had an episode PEA of unknown etiology for which the patient had a heart rate of apparently 20 bpm.  All medication management and ACLS protocol was followed and the patient then was placed on appropriate pressor support.  Since then his heart rate has been in and out of atrial fibrillation with 120 bpm currently relatively stable from the hemodynamically standpoint with some continued oxygen agitation issues.  BNP elevated consistent with current situation listed above.  5/18.  Patient has not had any significant changes in overall condition.  Patient still has atrial fibrillation with rapid ventricular rate although heart rate reasonably controlled below 110 bpm and will continue to watch.  The patient has had continued significant hypotension requiring medication management and pressure support.  The patient remains intubated from hypoxia and COVID infection and likely will require this for a longer period of time and is likely exacerbating atrial fibrillation at this time.  A. fib will likely be better controlled after control of current illness  Echocardiogram from showing mild global LV systolic dysfunction most consistent with atrial fibrillation rather than acute congestive heart failure with ejection fraction of 40 to 45%   Objective: Telemetry: Atrial fibrillation with variable heart rate and hypotension Physical Exam: Blood pressure (!) 87/64, pulse 78,  temperature (!) 96.8 F (36 C), resp. rate 13, height 5' 9.02" (1.753 m), weight 76.6 kg, SpO2 91 %. Body mass index is 24.93 kg/m.     Intake/Output Summary (Last 24 hours) at 06/10/2020 0814 Last data filed at 06/10/2020 0600 Gross per 24 hour  Intake 1504.45 ml  Output 2395 ml  Net -890.55 ml    Inpatient Medications:  . vitamin C  500 mg Per Tube Daily  . chlorhexidine gluconate (MEDLINE KIT)  15 mL Mouth Rinse BID  . Chlorhexidine Gluconate Cloth  6 each Topical Daily  . docusate  100 mg Per Tube BID  . feeding supplement (PROSource TF)  45 mL Per Tube TID  . feeding supplement (VITAL HIGH PROTEIN)  1,000 mL Per Tube Q24H  . free water  30 mL Per Tube Q4H  . hydrocortisone sod succinate (SOLU-CORTEF) inj  100 mg Intravenous Q6H  . insulin aspart  0-20 Units Subcutaneous Q4H  . insulin glargine  10 Units Subcutaneous Daily  . levothyroxine  125 mcg Per Tube Q0600  . mouth rinse  15 mL Mouth Rinse 10 times per day  . pantoprazole (PROTONIX) IV  40 mg Intravenous Q24H  . polyethylene glycol  17 g Per Tube Daily  . rivaroxaban  15 mg Per Tube Daily  . simvastatin  40 mg Per Tube Daily  . zinc sulfate  220 mg Per Tube Daily   Infusions:  . amiodarone 30 mg/hr (06/10/20 0600)  . dexmedetomidine (PRECEDEX) IV infusion 0.4 mcg/kg/hr (06/10/20 0600)  . fentaNYL infusion INTRAVENOUS 150 mcg/hr (06/10/20 0630)  . phenylephrine (NEO-SYNEPHRINE) Adult infusion 15 mcg/min (06/10/20 0600)  . propofol (DIPRIVAN) infusion Stopped (06/09/20 1214)  . vasopressin  Stopped (06/08/20 1624)    Labs: Recent Labs    06/09/20 0628 06/09/20 1748 06/10/20 0458  NA 144  --  148*  K 4.5  --  3.5  CL 107  --  110  CO2 28  --  29  GLUCOSE 216*  --  158*  BUN 64*  --  62*  CREATININE 1.53*  --  1.16  CALCIUM 7.7*  --  7.9*  MG 2.6* 2.6* 2.5*  PHOS 5.2* 4.1 2.7   Recent Labs    06/08/20 0251  AST 204*  ALT 178*  ALKPHOS 58  BILITOT 0.5  PROT 6.3*  ALBUMIN 2.3*   Recent Labs     06/08/20 0251 06/09/20 0628 06/10/20 0458  WBC 11.2* 10.3 5.9  NEUTROABS 10.3*  --  5.3  HGB 9.1* 8.5* 8.2*  HCT 29.9* 27.9* 25.9*  MCV 90.3 88.6 86.6  PLT 192 172 122*   No results for input(s): CKTOTAL, CKMB, TROPONINI in the last 72 hours. Invalid input(s): POCBNP No results for input(s): HGBA1C in the last 72 hours.   Weights: Filed Weights   06/07/20 1600 06/08/20 0100 06/10/20 0500  Weight: 75.7 kg 76.9 kg 76.6 kg     Radiology/Studies:  DG ABDOMEN PEG TUBE LOCATION  Result Date: 05/15/2020 CLINICAL DATA:  Status post PEG tube placement EXAM: ABDOMEN - 1 VIEW COMPARISON:  10/16/2019 FINDINGS: Supine portable view of the abdomen. Gastrostomy tube balloon projects over the body of the stomach. Contrast opacifies the stomach and duodenum. No gross extravasation. IMPRESSION: Gastrostomy tube overlies the body of the stomach. No gross extravasation. Electronically Signed   By: Donavan Foil M.D.   On: 05/15/2020 20:28   DG Chest Port 1 View  Result Date: 06/08/2020 CLINICAL DATA:  Check endotracheal tube placement EXAM: PORTABLE CHEST 1 VIEW COMPARISON:  04/08/2020 FINDINGS: Cardiac shadow is stable. Aortic calcifications are again seen. Endotracheal tube is again noted and stable. Postsurgical change diffuse bilateral airspace opacity consistent with congestive failure and edema. No pneumothorax is noted. IMPRESSION: Chronic changes of CHF. Endotracheal tube in noted in satisfactory position. Electronically Signed   By: Inez Catalina M.D.   On: 06/08/2020 22:18   DG Chest Port 1 View  Result Date: 06/08/2020 CLINICAL DATA:  Intubation. EXAM: PORTABLE CHEST 1 VIEW COMPARISON:  Two days ago FINDINGS: Endotracheal tube with tip below the clavicular heads. Diffuse interstitial opacity with cephalized blood flow and Kerley lines. Small pleural effusions. Mild cardiomegaly. CABG. IMPRESSION: 1. Endotracheal tube in unremarkable position. 2. Extensive and symmetric opacity favoring CHF  Electronically Signed   By: Monte Fantasia M.D.   On: 06/08/2020 04:04   DG Chest Portable 1 View  Result Date: 06/22/2020 CLINICAL DATA:  Shortness of breath.  COVID-19 positive EXAM: PORTABLE CHEST 1 VIEW COMPARISON:  February 13, 2007 FINDINGS: There is ill-defined airspace opacity in the right mid lung and both lung bases. Equivocal increased opacity left mid lung. Heart is upper normal in size with pulmonary vascularity normal. Patient is status post coronary artery bypass grafting. No adenopathy. There is aortic atherosclerosis. No bone lesions IMPRESSION: Ill-defined airspace opacity, more on the right than on the left without consolidation. Suspect multifocal pneumonia, likely of atypical organism etiology. A degree of mild underlying interstitial edema is possible. Status post coronary artery bypass grafting. Heart upper normal in size. Aortic Atherosclerosis (ICD10-I70.0). Electronically Signed   By: Lowella Grip III M.D.   On: 06/02/2020 21:52   ECHOCARDIOGRAM COMPLETE  Result Date: 06/09/2020  ECHOCARDIOGRAM REPORT   Patient Name:   Sheriff Pablo Ledger Date of Exam: 06/09/2020 Medical Rec #:  [id number]        Height:       69.0 in Accession #:    [id number]       Weight:       169.5 lb Date of Birth:  05-13-1945        BSA:          1.926 m Patient Age:    22 years         BP:           110/58 mmHg Patient Gender: M                HR:           103 bpm. Exam Location:  [facility] Procedure: 2D Echo, Color Doppler and Cardiac Doppler Indications:     Cardiac arrest I45.9  History:         Patient has no prior history of Echocardiogram examinations.                  Previous Myocardial Infarction; Risk Factors:Hypertension.  Sonographer:     Sherrie Sport RDCS (AE) Referring Phys:  [id number] Jarratt L RUST[place]CHESTER Diagnosing Phys: Bartholome Bill MD  Sonographer Comments: Echo performed with patient supine and on artificial respirator. IMPRESSIONS  1. Left ventricular ejection fraction, by estimation, is 40  to 45%. The left ventricle has mildly decreased function. The left ventricle demonstrates global hypokinesis. There is moderate left ventricular hypertrophy. Left ventricular diastolic parameters are consistent with Grade I diastolic dysfunction (impaired relaxation).  2. Right ventricular systolic function is normal. The right ventricular size is mildly enlarged.  3. Left atrial size was mildly dilated.  4. Right atrial size was mildly dilated.  5. The mitral valve is grossly normal. Trivial mitral valve regurgitation.  6. The aortic valve is calcified. Aortic valve regurgitation is trivial. Mild to moderate aortic valve sclerosis/calcification is present, without any evidence of aortic stenosis. FINDINGS  Left Ventricle: Left ventricular ejection fraction, by estimation, is 40 to 45%. The left ventricle has mildly decreased function. The left ventricle demonstrates global hypokinesis. The left ventricular internal cavity size was normal in size. There is  moderate left ventricular hypertrophy. Left ventricular diastolic parameters are consistent with Grade I diastolic dysfunction (impaired relaxation). Right Ventricle: The right ventricular size is mildly enlarged. No increase in right ventricular wall thickness. Right ventricular systolic function is normal. Left Atrium: Left atrial size was mildly dilated. Right Atrium: Right atrial size was mildly dilated. Pericardium: There is no evidence of pericardial effusion. Mitral Valve: The mitral valve is grossly normal. Trivial mitral valve regurgitation. Tricuspid Valve: The tricuspid valve is not well visualized. Tricuspid valve regurgitation is trivial. Aortic Valve: The aortic valve is calcified. Aortic valve regurgitation is trivial. Mild to moderate aortic valve sclerosis/calcification is present, without any evidence of aortic stenosis. Aortic valve mean gradient measures 11.7 mmHg. Aortic valve peak gradient measures 19.2 mmHg. Aortic valve area, by VTI  measures 1.42 cm. Pulmonic Valve: The pulmonic valve was not assessed. Pulmonic valve regurgitation is not visualized. Aorta: The aortic root is normal in size and structure. IAS/Shunts: The interatrial septum was not assessed.  LEFT VENTRICLE PLAX 2D LVIDd:         4.09 cm LVIDs:         3.47 cm LV PW:         1.40 cm LV IVS:  1.74 cm LVOT diam:     2.15 cm LV SV:         45 LV SV Index:   24 LVOT Area:     3.63 cm  LEFT ATRIUM             Index       RIGHT ATRIUM           Index LA diam:        4.00 cm 2.08 cm/m  RA Area:     19.80 cm LA Vol (A2C):   46.8 ml 24.30 ml/m RA Volume:   55.50 ml  28.82 ml/m LA Vol (A4C):   50.8 ml 26.38 ml/m LA Biplane Vol: 52.3 ml 27.16 ml/m  AORTIC VALVE                    PULMONIC VALVE AV Area (Vmax):    1.27 cm     PV Vmax:        0.60 m/s AV Area (Vmean):   1.26 cm     PV Peak grad:   1.4 mmHg AV Area (VTI):     1.42 cm     RVOT Peak grad: 2 mmHg AV Vmax:           219.00 cm/s AV Vmean:          152.000 cm/s AV VTI:            0.320 m AV Peak Grad:      19.2 mmHg AV Mean Grad:      11.7 mmHg LVOT Vmax:         76.40 cm/s LVOT Vmean:        52.700 cm/s LVOT VTI:          0.125 m LVOT/AV VTI ratio: 0.39  AORTA Ao Root diam: 3.40 cm MITRAL VALVE               TRICUSPID VALVE MV Area (PHT): 6.37 cm    TR Peak grad:   9.2 mmHg MV Decel Time: 119 msec    TR Vmax:        152.00 cm/s MV E velocity: 97.30 cm/s                            SHUNTS                            Systemic VTI:  0.12 m                            Systemic Diam: 2.15 cm Bartholome Bill MD Electronically signed by Bartholome Bill MD Signature Date/Time: 06/09/2020/12:10:29 PM    Final      Assessment and Recommendation  75 y.o. male with known coronary disease as recorded bypass graft and acute paroxysmal nonvalvular atrial fibrillation exacerbated by acute COVID-pneumonia status post PEA and ACLS protocol now with variable pressures needing pressure support and intermittent paroxysmal atrial  fibrillation awaiting for improvements with hypoxia and current infection 1.  Continue supportive care for significant.  COVID-pneumonia hypoxia and all other issues that are related 2.  Continue pressure support due to hypotension from 3.  No additional medication management for atrial fibrillation at this time due to concerns of hypotension and will continue amiodarone at this time for possible maintenance of normal sinus rhythm if able and  will also help with the potential for heart rate control 4.  Heparin for further risk reduction stroke with atrial fibrillation as well as for COVID-pneumonia 5.  No further cardiac diagnostics necessary at this time Signed, Serafina Royals M.D. FACC

## 2020-06-10 NOTE — Progress Notes (Signed)
Patient with significant ETT cuff leak. Not able to hold adequate air even with cap in place. Minute ventilation alarm sounding continuously. Tidal volumes dipping below set parameters. Provider notified. Patient was subsequently re-intubated with a 8.0 ETT taped at 26cm at the lip. Breath sounds equal bilaterally. CXR ordered and verified. Returned patient to ventilator. Will continue to monitor.

## 2020-06-10 NOTE — Progress Notes (Signed)
Patient biting ETT, and asynchronis with vent, PO2 SATs 88%. RT at bedside increased FIO2 to 40% and Fentanyl DTT increased.

## 2020-06-10 NOTE — Progress Notes (Signed)
Brief Nutrition Note  Propofol no longer infusing and providing a source of kcal for pt. Precedex now being utilized for sedation. Will adjust TF regimen to account for the loss of kcal from propofol.  New recommendations are as follows:  Vital AF 1.2 @ 50ml/hr via PEG  Pro-Source 27ml BID via tube, provides 40kcal and 11g of protein per packet  Free water flushes 46ml q4 hours to maintain tube patency   Regimen provides 1520kcal/day, 112g/day protein and 1141ml/day free water   Ranell Patrick, RD, LDN Clinical Dietitian Pager on Amion

## 2020-06-10 NOTE — Progress Notes (Signed)
GOALS OF CARE DISCUSSION  The Clinical status was relayed to family in detail. Wife over the phone Lily Kernen  Updated and notified of patients medical condition.    Patient remains unresponsive and will not open eyes to command.   Patient is having a weak cough and struggling to remove secretions.   Patient with increased WOB and using accessory muscles to breathe Explained to family course of therapy and the modalities    Patient with Progressive multiorgan failure with a very high probablity of a very minimal chance of meaningful recovery despite all aggressive and optimal medical therapy.  PATIENT REMAINS FULL CODE  Family understands the situation. Severe COVID 19 pneumonia Severe systolic CHF Vent support,-signs of brain damage-obtain MRI to assess   Family are satisfied with Plan of action and management. All questions answered  Additional CC time 40 mins   Titianna Loomis Patricia Pesa, M.D.  [facility]  Medical Director [place] Director [facility] Cardio-Pulmonary Department

## 2020-06-10 NOTE — Progress Notes (Signed)
NAME:  Jermaine Campbell, MRN:  [id number], DOB:  Oct 06, 1945, LOS: 4 ADMISSION DATE:  06-19-2020  75 year old male presenting to [facility] ED from home with complaints of progressive shortness of breath over the past 5 days with associated new onset of nonproductive cough, subjective fever/chills.  Patient underwent outpatient testing at [facility] on 06/05/2020 and was found to be positive for COVID-19.  Due to subsequent further progression of symptoms he arrived at [facility] ED for further evaluation. ED course:  Patient found to be in SVT at 195 receiving 2 doses of IV adenosine which then revealed A. fib RVR.  Dr. Nehemiah Massed with cardiology recommended electrocardioversion which was completed successfully in the ED and ensuing sinus rhythm.  Cardiology additionally recommended the initiation of an amiodarone bolus followed by continuous amiodarone and diltiazem drips.  Initial vitals: T 99, tachypneic at 36, tachycardic 195, BP 158/127 with SPO2 at 88% on room air Significant labs: BUN 48, CRP 29.4, PCT 1.10, D-dimer 2.21, fibrinogen greater than 800.  Chest x-ray showing bilateral airspace opacities greater on the right without consolidation concerning for multifocal atypical pneumonia and mild underlying interstitial edema possible. TRH consulted for admission to PCU.  Hospital course: Patient started on dexamethasone and remdesivir as well as bronchodilators.  Amiodarone and diltiazem drips continued with home Xarelto for atrial fibrillation.  Overnight on 06/07/2020 per nursing patient flipped into A. fib RVR with heart rate in the 170s and associated hypoxia requiring non rebreather.  Subsequently the patient became bradycardic with heart rate in the 20s and when nursing arrived bedside to assess the patient and found him pulseless, unresponsive and apneic.  CPR was initiated and per nursing patient was defibrillated once before code team arrived, unclear what rhythm might have been at that time.  Upon  CODE BLUE team arrival patient was in PEA and being bagged with BVM.  The patient received 3 rounds of epinephrine, 2 A of bicarb with Levophed drip started.  Patient was emergently intubated and ROSC was obtained with post rhythm sinus with sinus arrhythmia.  Patient urgently transferred to ICU with PCCM consulted.  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 fib RVR requiring amiodarone and diltiazem drips status post cardioversion.  06/08/20-overnight patient developed A. fib RVR, became hypoxic and ultimately bradycardic before PEA arrest staying requiring emergent intubation and mechanical ventilation with transfer to ICU  06/09/2020: Currently supine, has tolerated well.  FiO2 requirements down.  Cardiac rhythm still erratic LVEF 40 to 45% down from 2019.    5/18 remains on vent   MICRO DATA COVID 19+   Antibiotics Given (last 72 hours)    Date/Time Action Medication Dose Rate   06/07/20 1135 New Bag/Given   remdesivir 100 mg in sodium chloride 0.9 % 100 mL IVPB 100 mg 200 mL/hr      Interim History / Subjective:  Remains intubated Severe hypoxia Remains critically il          Objective   Blood pressure (!) 87/64, pulse 78, temperature (!) 96.8 F (36 C), resp. rate 13, height 5' 9.02" (1.753 m), weight 76.6 kg, SpO2 91 %.  Vent Mode: PRVC FiO2 (%):  [36 %-40 %] 40 % Set Rate:  [22 bmp] 22 bmp Vt Set:  [420 mL] 420 mL PEEP:  [5 cmH20-8 cmH20] 5 cmH20   Intake/Output Summary (Last 24 hours)  at 06/10/2020 0716 Last data filed at 06/10/2020 0600 Gross per 24 hour  Intake 1504.45 ml  Output 2395 ml  Net -890.55 ml   Filed Weights   06/07/20 1600 06/08/20 0100 06/10/20 0500  Weight: 75.7 kg 76.9 kg 76.6 kg      REVIEW OF SYSTEMS  PATIENT IS UNABLE TO PROVIDE COMPLETE REVIEW OF SYSTEMS DUE TO SEVERE CRITICAL ILLNESS AND TOXIC METABOLIC ENCEPHALOPATHY   PHYSICAL EXAMINATION:  GENERAL:critically ill appearing, +resp distress HEAD: Normocephalic, atraumatic.  EYES: Pupils equal, round, reactive to light.  No scleral icterus.  MOUTH: Moist mucosal membrane. NECK: Supple. PULMONARY: +rhonchi, +wheezing CARDIOVASCULAR: S1 and S2. Regular rate and rhythm. No murmurs, rubs, or gallops.  GASTROINTESTINAL: Soft, nontender, -distended. Positive bowel sounds.  MUSCULOSKELETAL: No swelling, clubbing, or edema.  NEUROLOGIC: obtunded SKIN:intact,warm,dry   Labs/imaging that I havepersonally reviewed  (right click and "Reselect all SmartList Selections" daily)       ASSESSMENT AND PLAN SYNOPSIS  75 yo white male  with severe ARDS/ALI due to severe COVID 19 pneumonia with septic shock  Severe COVID-19 infection, ARDS and pneumonia/pneumonitis Continue IV steroids  Continue IV remdisivir Aggressive pulm toilet recommended Pulmonary hygiene Continue proning as tolerated due to severe hypoxia   Maintain airborne and contact precautions  As needed bronchodilators (MDI) Vitamin C and zinc Antitussives High risk for death     Acute hypoxemic respiratory failure due to COVID-19 pneumonia/ARDS Mechanical ventilation via ARDS protocol, target PRVC 6 cc/kg Wean PEEP and FiO2 as able Goal plateau pressure less than 30, driving pressure less than 15 Paralytics if necessary for vent synchrony, gas exchange Cycle prone positioning if necessary for oxygenation Deep sedation per PAD protocol diuresis as tolerated based on Kidney function VAP prevention order set Follow  inflammatory markers as needed with CRP Vitamin C, zinc as indicated Plan to repeat and check resp cultures if needed     Severe ACUTE Hypoxic and Hypercapnic Respiratory Failure -continue Mechanical Ventilator support -continue Bronchodilator Therapy -Wean Fio2 and PEEP as tolerated -VAP/VENT bundle implementation Vent Mode: PRVC FiO2 (%):  [36 %-40 %] 40 % Set Rate:  [22 bmp] 22 bmp Vt Set:  [420 mL] 420 mL PEEP:  [5 cmH20-8 cmH20] 5 cmH20     CARDIAC FAILURE-acute  Systolic dysfunction EF 40 to 45% -oxygen as needed -Lasix as tolerated -follow up cardiac enzymes as indicated   CARDIAC ICU monitoring   ACUTE KIDNEY INJURY/Renal Failure -continue Foley Catheter-assess need -Avoid nephrotoxic agents -Follow urine output, BMP -Ensure adequate renal perfusion, optimize oxygenation -Renal dose medications   NEUROLOGY Acute toxic metabolic encephalopathy, need for sedation Goal RASS -2 to -3   SEPTIC SHOCK -use vasopressors to keep MAP>65 as needed   INFECTIOUS DISEASE SEVERE COVID 19 PNEUMONIA -continue antibiotics as prescribed -follow up cultures -follow up ID consultation  ENDO - ICU hypoglycemic\Hyperglycemia protocol -check FSBS per protocol   GI GI PROPHYLAXIS as indicated  NUTRITIONAL STATUS DIET-->TF's as tolerated Constipation protocol as indicated   ELECTROLYTES -follow labs as needed -replace as needed -pharmacy consultation and following     Best practice (right click and "Reselect all SmartList Selections" daily)  Diet:  NPO Pain/Anxiety/Delirium protocol (if indicated): Yes (RASS goal -2) VAP protocol (if indicated): Yes DVT prophylaxis: Systemic AC GI  prophylaxis: PPI Glucose control:  SSI Yes Central venous access:  Yes, and it is still needed Arterial line:  Yes, and it is still needed Foley:  Yes, and it is still needed Mobility:  bed rest  PT consulted: N/A Last date of multidisciplinary goals of care discussion  06/07/2020 Code Status:  full code Disposition: ICU   Labs   CBC: Recent Labs  Lab 06/01/2020 2120 06/07/20 0423 06/08/20 0251 06/09/20 0628 06/10/20 0458  WBC 4.5 5.2 11.2* 10.3 5.9  NEUTROABS 3.9 4.7 10.3*  --  5.3  HGB 10.0* 8.2* 9.1* 8.5* 8.2*  HCT 32.1* 26.3* 29.9* 27.9* 25.9*  MCV 87.7 88.0 90.3 88.6 86.6  PLT 144* 103* 192 172 122*    Basic Metabolic Panel: Recent Labs  Lab 06/17/2020 2120 06/15/2020 2120 06/07/20 0423 06/08/20 0251 06/08/20 2140 06/09/20 0628 06/09/20 1748 06/10/20 0458  NA 135  --  140  137 139  --  144  --  148*  K 4.2  --  4.4  4.4 4.9  --  4.5  --  3.5  CL 97*  --  101  102 101  --  107  --  110  CO2 25  --  21*  24 27  --  28  --  29  GLUCOSE 213*  --  216*  229* 399*  --  216*  --  158*  BUN 48*  --  51*  42* 54*  --  64*  --  62*  CREATININE 1.06  --  0.99  0.89 1.38*  --  1.53*  --  1.16  CALCIUM 9.1  --  9.1  8.1* 7.7*  --  7.7*  --  7.9*  MG 2.2  --  1.9 2.4 2.5* 2.6* 2.6* 2.5*  PHOS  --    < > 2.6 6.3* 5.7* 5.2* 4.1 2.7   < > = values in this interval not displayed.   GFR: Estimated Creatinine Clearance: 53.3 mL/min (by C-G formula based on SCr of 1.16 mg/dL). Recent Labs  Lab 06/07/2020 2120 06/07/20 0423 06/08/20 0251 06/08/20 0551 06/08/20 2140 06/09/20 0628 06/10/20 0458  PROCALCITON 1.10  --  0.80  --   --  2.66 1.33  WBC 4.5 5.2 11.2*  --   --  10.3 5.9  LATICACIDVEN  --   --  3.8* 2.5* 1.2  --   --     Liver Function Tests: Recent Labs  Lab 06/07/20 0423 06/08/20 0251  AST 48*  41 204*  ALT 21  18 178*  ALKPHOS 58  51 58  BILITOT 0.5  0.5 0.5  PROT 7.8  6.7 6.3*  ALBUMIN 3.3*  2.6* 2.3*   No results for input(s): LIPASE, AMYLASE in the last 168 hours. No results for input(s): AMMONIA in the last 168 hours.  ABG    Component Value Date/Time   PHART 7.32 (L) 06/09/2020 0005   PCO2ART 60 (H) 06/09/2020 0005   PO2ART 106 06/09/2020 0005   HCO3 30.9 (H) 06/09/2020 0005   ACIDBASEDEF 0.2  06/08/2020 0551   O2SAT 97.7 06/09/2020 0005     Coagulation Profile: Recent Labs  Lab 06/08/20 0251  INR 1.2    Cardiac Enzymes: No results for input(s): CKTOTAL, CKMB, CKMBINDEX, TROPONINI in the last 168 hours.  HbA1C: Hgb A1c MFr Bld  Date/Time Value Ref Range Status  06/07/2020 04:23 AM 6.3 (H) 4.8 - 5.6 % Final    Comment:    (NOTE) Pre diabetes:  5.7%-6.4%  Diabetes:              >6.4%  Glycemic control for   <7.0% adults with diabetes     CBG: Recent Labs  Lab 06/09/20 1114 06/09/20 1557 06/09/20 1924 06/09/20 2355 06/10/20 0302  GLUCAP 181* 126* 125* 137* 152*    Allergies Allergies  Allergen Reactions  . Tamsulosin Hcl Other (See Comments)    Makes blood pressure drop low and pass out       DVT/GI PRX  assessed I Assessed the need for Labs I Assessed the need for Foley I Assessed the need for Central Venous Line Family Discussion when available I Assessed the need for Mobilization I made an Assessment of medications to be adjusted accordingly Safety Risk assessment completed  CASE DISCUSSED IN MULTIDISCIPLINARY ROUNDS WITH ICU TEAM     Critical Care Time devoted to patient care services described in this note is 65  minutes.  Critical care was necessary to treat or prevent imminent or life-threatening deterioration. Overall, patient is critically ill, prognosis is guarded.  Patient with Multiorgan failure and at high risk for cardiac arrest and death.    Corrin Parker, M.D.  [facility]  Medical Director [place] Director [facility] Cardio-Pulmonary Department

## 2020-06-10 NOTE — Procedures (Signed)
Intubation Procedure Note  KIENAN DOUBLIN  [id number]  04/14/1945  Date:06/10/20  Time:9:37 PM   Provider Performing:Jacoby Ritsema A Ngina Royer    Procedure: Intubation (11572)  Indication(s) Respiratory Failure, now with cuff leak requiring tube exchange  Consent Unable to obtain consent due to emergent nature of procedure.   Anesthesia Etomidate, Fentanyl and Rocuronium   Time Out Verified patient identification, verified procedure, site/side was marked, verified correct patient position, special equipment/implants available, medications/allergies/relevant history reviewed, required imaging and test results available.   Sterile Technique Usual hand hygeine, masks, and gloves were used   Procedure Description Patient positioned in bed supine.  Sedation given as noted above.  Patient was intubated with endotracheal tube using Glidescope.  View was Grade 1 full glottis .  Number of attempts was 1.  Colorimetric CO2 detector was consistent with tracheal placement.   Complications/Tolerance None; patient tolerated the procedure well. Chest X-ray is ordered to verify placement.   EBL Minimal   Specimen(s) None   Rufina Falco, DNP, CCRN, FNP-C, AGACNP-BC Acute Care Nurse Practitioner  [facility] Pager: [phone] [place] at [facility]

## 2020-06-11 ENCOUNTER — Inpatient Hospital Stay: Payer: Medicare Other

## 2020-06-11 DIAGNOSIS — G931 Anoxic brain damage, not elsewhere classified: Secondary | ICD-10-CM | POA: Diagnosis not present

## 2020-06-11 DIAGNOSIS — R569 Unspecified convulsions: Secondary | ICD-10-CM | POA: Diagnosis not present

## 2020-06-11 DIAGNOSIS — U071 COVID-19: Secondary | ICD-10-CM | POA: Diagnosis not present

## 2020-06-11 DIAGNOSIS — L8990 Pressure ulcer of unspecified site, unspecified stage: Secondary | ICD-10-CM | POA: Insufficient documentation

## 2020-06-11 DIAGNOSIS — J1282 Pneumonia due to coronavirus disease 2019: Secondary | ICD-10-CM | POA: Diagnosis not present

## 2020-06-11 LAB — CBC WITH DIFFERENTIAL/PLATELET
Abs Immature Granulocytes: 0.03 10*3/uL (ref 0.00–0.07)
Basophils Absolute: 0 10*3/uL (ref 0.0–0.1)
Basophils Relative: 0 %
Eosinophils Absolute: 0 10*3/uL (ref 0.0–0.5)
Eosinophils Relative: 0 %
HCT: 27.4 % — ABNORMAL LOW (ref 39.0–52.0)
Hemoglobin: 8.3 g/dL — ABNORMAL LOW (ref 13.0–17.0)
Immature Granulocytes: 0 %
Lymphocytes Relative: 2 %
Lymphs Abs: 0.2 10*3/uL — ABNORMAL LOW (ref 0.7–4.0)
MCH: 27.3 pg (ref 26.0–34.0)
MCHC: 30.3 g/dL (ref 30.0–36.0)
MCV: 90.1 fL (ref 80.0–100.0)
Monocytes Absolute: 0.1 10*3/uL (ref 0.1–1.0)
Monocytes Relative: 2 %
Neutro Abs: 6.9 10*3/uL (ref 1.7–7.7)
Neutrophils Relative %: 96 %
Platelets: 149 10*3/uL — ABNORMAL LOW (ref 150–400)
RBC: 3.04 MIL/uL — ABNORMAL LOW (ref 4.22–5.81)
RDW: 19.8 % — ABNORMAL HIGH (ref 11.5–15.5)
Smear Review: NORMAL
WBC: 7.2 10*3/uL (ref 4.0–10.5)
nRBC: 1 % — ABNORMAL HIGH (ref 0.0–0.2)

## 2020-06-11 LAB — MAGNESIUM: Magnesium: 2.7 mg/dL — ABNORMAL HIGH (ref 1.7–2.4)

## 2020-06-11 LAB — GLUCOSE, CAPILLARY
Glucose-Capillary: 158 mg/dL — ABNORMAL HIGH (ref 70–99)
Glucose-Capillary: 164 mg/dL — ABNORMAL HIGH (ref 70–99)
Glucose-Capillary: 218 mg/dL — ABNORMAL HIGH (ref 70–99)
Glucose-Capillary: 235 mg/dL — ABNORMAL HIGH (ref 70–99)
Glucose-Capillary: 259 mg/dL — ABNORMAL HIGH (ref 70–99)
Glucose-Capillary: 272 mg/dL — ABNORMAL HIGH (ref 70–99)

## 2020-06-11 LAB — BASIC METABOLIC PANEL
Anion gap: 9 (ref 5–15)
BUN: 76 mg/dL — ABNORMAL HIGH (ref 8–23)
CO2: 29 mmol/L (ref 22–32)
Calcium: 7.9 mg/dL — ABNORMAL LOW (ref 8.9–10.3)
Chloride: 110 mmol/L (ref 98–111)
Creatinine, Ser: 1.23 mg/dL (ref 0.61–1.24)
GFR, Estimated: >60 mL/min (ref >60)
Glucose, Bld: 247 mg/dL — ABNORMAL HIGH (ref 70–99)
Potassium: 4.5 mmol/L (ref 3.5–5.1)
Sodium: 148 mmol/L — ABNORMAL HIGH (ref 135–145)

## 2020-06-11 LAB — PROCALCITONIN: Procalcitonin: 1.02 ng/mL

## 2020-06-11 LAB — C-REACTIVE PROTEIN: CRP: 37.2 mg/dL — ABNORMAL HIGH (ref <1.0)

## 2020-06-11 LAB — PHOSPHORUS: Phosphorus: 5 mg/dL — ABNORMAL HIGH (ref 2.5–4.6)

## 2020-06-11 MED ORDER — PROSOURCE TF PO LIQD
45.0000 mL | Freq: Four times a day (QID) | ORAL | Status: DC
  Administered 2020-06-11 – 2020-06-13 (×10): 45 mL
  Filled 2020-06-11: qty 45

## 2020-06-11 MED ORDER — METHYLPREDNISOLONE SODIUM SUCC 40 MG IJ SOLR
40.0000 mg | Freq: Two times a day (BID) | INTRAMUSCULAR | Status: DC
  Administered 2020-06-11 (×2): 40 mg via INTRAVENOUS
  Filled 2020-06-11 (×2): qty 1

## 2020-06-11 MED ORDER — FREE WATER
30.0000 mL | Status: DC
  Administered 2020-06-11 – 2020-06-12 (×5): 30 mL

## 2020-06-11 MED ORDER — DIGOXIN 0.25 MG/ML IJ SOLN
0.1250 mg | Freq: Once | INTRAMUSCULAR | Status: AC
  Administered 2020-06-11: 0.125 mg via INTRAVENOUS
  Filled 2020-06-11: qty 2

## 2020-06-11 MED ORDER — RIVAROXABAN 20 MG PO TABS
20.0000 mg | ORAL_TABLET | Freq: Every day | ORAL | Status: DC
  Administered 2020-06-11 – 2020-06-13 (×3): 20 mg
  Filled 2020-06-11 (×4): qty 1

## 2020-06-11 NOTE — Progress Notes (Signed)
eeg done °

## 2020-06-11 NOTE — Progress Notes (Signed)
Updated pts wife Carden Teel regarding MRI Brain results and pts current condition.  Informed Mrs. [facility] neurology consult has been ordered.  Mrs. Derusha was appreciative of the update and all questions were answered.  Marda Stalker, [place] Pager [phone] (please enter 7 digits) PCCM Consult Pager [phone] (please enter 7 digits)

## 2020-06-11 NOTE — Progress Notes (Signed)
NAME:  Jermaine Campbell, MRN:  [id number], DOB:  1945/12/29, LOS: 5 ADMISSION DATE:  06/28/2020 75 year old male presenting to [facility] ED from home with complaints of progressive shortness of breath over the past 5 days with associated new onset of nonproductive cough, subjective fever/chills. Patient underwent outpatient testing at [facility] on 06/05/2020 and was found to be positive for COVID-19. Due to subsequent further progression of symptoms he arrived at [facility] ED for further evaluation. ED course:  Patient found to be in SVT at 195 receiving 2 doses of IV adenosine which then revealed A. fib RVR. Dr. Nehemiah Massed with cardiology recommended electrocardioversion which was completed successfully in the ED and ensuing sinus rhythm. Cardiology additionally recommended the initiation of an amiodarone bolus followed by continuous amiodarone and diltiazem drips.  Initial vitals: T 99, tachypneic at 36, tachycardic 195, BP 158/127 with SPO2 at 88% on room air Significant labs: BUN 48, CRP 29.4, PCT 1.10, D-dimer 2.21, fibrinogen greater than 800. Chest x-ray showing bilateral airspace opacities greater on the right without consolidation concerning for multifocal atypical pneumonia and mild underlying interstitial edema possible. TRH consulted for admission to PCU.  Hospital course: Patient started on dexamethasone and remdesivir as well as bronchodilators. Amiodarone and diltiazem drips continued with home Xarelto for atrial fibrillation.  Overnight on 06/07/2020 per nursing patient flipped into A. fib RVR with heart rate in the 170s and associated hypoxia requiring non rebreather. Subsequently the patient became bradycardic with heart rate in the 20s and when nursing arrived bedside to assess the patient and found him pulseless, unresponsive and apneic. CPR was initiated and per nursing patient was defibrillated once before code team arrived, unclear what rhythm might have been at that time. Upon  CODE BLUE team arrival patient was in PEA and being bagged with BVM. The patient received 3 rounds of epinephrine, 2 A of bicarb with Levophed drip started. Patient was emergently intubated and ROSC was obtained with post rhythm sinus with sinus arrhythmia. Patient urgently transferred to ICU with PCCM consulted.  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 fib RVR requiring amiodarone and diltiazem drips status post cardioversion.  06/08/20-overnight patient developed A. fib RVR, became hypoxic and ultimately bradycardic before PEA arrest staying requiring emergent intubation and mechanical ventilation with transfer to ICU  06/09/2020: Currently supine, has tolerated well. FiO2 requirements down. Cardiac rhythm still erratic LVEF 40 to 45% down from 2019.   5/18 remains on vent  5/19 MRI shows LEFT BRAIN INJURY, remains intubated, on vent   MICRO DATA COVID 19+           Antibiotics Given (last 72 hours)    Date/Time Action Medication Dose Rate   06/07/20 1135 New Bag/Given   remdesivir 100 mg in sodium chloride 0.9 % 100 mL IVPB 100 mg 200 mL/hr       Interim History / Subjective:  Remains intubated on vent, +shock on pressors Severe critical illness MRI shows brain injury-neuro consult pending ETT replaced several times        Objective   Blood pressure 97/75, pulse (!) 111, temperature 98.06 F (36.7 C), resp.  rate (!) 22, height 5' 9.02" (1.753 m), weight 76.6 kg, SpO2 97 %.    Vent  Mode: PRVC FiO2 (%):  [45 %-60 %] 60 % Set Rate:  [22 bmp] 22 bmp Vt Set:  [420 mL] 420 mL PEEP:  [5 cmH20-10 cmH20] 10 cmH20 Plateau Pressure:  [15 cmH20-19 cmH20] 15 cmH20   Intake/Output Summary (Last 24 hours) at 06/11/2020 0715 Last data filed at 06/11/2020 0600 Gross per 24 hour  Intake 2240.26 ml  Output 1145 ml  Net 1095.26 ml   Filed Weights   06/07/20 1600 06/08/20 0100 06/10/20 0500  Weight: 75.7 kg 76.9 kg 76.6 kg      REVIEW OF SYSTEMS  PATIENT IS UNABLE TO PROVIDE COMPLETE REVIEW OF SYSTEMS DUE TO SEVERE CRITICAL ILLNESS AND TOXIC METABOLIC ENCEPHALOPATHY   PHYSICAL EXAMINATION:  GENERAL:critically ill appearing, +resp distress HEAD: Normocephalic, atraumatic.  EYES: Pupils equal, round, reactive to light.  No scleral icterus.  MOUTH: Moist mucosal membrane. NECK: Supple. PULMONARY: +rhonchi, +wheezing CARDIOVASCULAR: S1 and S2. Regular rate and rhythm. No murmurs, rubs, or gallops.  GASTROINTESTINAL: Soft, nontender, -distended. Positive bowel sounds.  MUSCULOSKELETAL: No swelling, clubbing, or edema.  NEUROLOGIC: obtunded SKIN:intact,warm,dry   Labs/imaging that I havepersonally reviewed  (right click and "Reselect all SmartList Selections" daily)      ASSESSMENT AND PLAN SYNOPSIS   75 yo white male  with severe ARDS/ALI due to severe COVID 19 pneumonia with septic shock with acute PEA cardiac arrest with sever s CHF exacerbation and ischemic cardiomyopathy with signs and symptoms of brain damage  Severe ACUTE Hypoxic and Hypercapnic Respiratory Failure -continue Mechanical Ventilator support -continue Bronchodilator Therapy -Wean Fio2 and PEEP as tolerated -VAP/VENT bundle implementation Unable to wean from vent    CARDIAC FAILURE-acute  Systolic dysfunction Ischemic cardiomyopathy -oxygen as needed -Lasix as tolerated -follow up cardiac enzymes as indicated   CARDIAC ICU monitoring   ACUTE KIDNEY INJURY/Renal Failure -continue  Foley Catheter-assess need -Avoid nephrotoxic agents -Follow urine output, BMP -Ensure adequate renal perfusion, optimize oxygenation -Renal dose medications   NEUROLOGY Acute toxic metabolic encephalopathy, need for sedation Goal RASS -2 to -3 MRI shows brain damage left temporal lobe-NEUROLOGY CONSULTED   SEPTIC SHOCK -use vasopressors to keep MAP>65 as needed   INFECTIOUS DISEASE COVID + -continue antibiotics as prescribed -follow up cultures   ENDO - ICU hypoglycemic\Hyperglycemia protocol -check FSBS per protocol   GI GI PROPHYLAXIS as indicated  NUTRITIONAL STATUS DIET-->TF's as tolerated Constipation protocol as indicated   ELECTROLYTES -follow labs as needed -replace as needed -pharmacy consultation and following      Best practice (right click and "Reselect all SmartList Selections" daily)  Diet: NPO Pain/Anxiety/Delirium protocol (if indicated): Yes (RASS goal -2) VAP protocol (if indicated): Yes DVT prophylaxis: Systemic AC GI prophylaxis: PPI Glucose control: SSI Yes Central venous access: Yes, and it is still needed Arterial line: Yes, and it is still needed Foley: Yes, and it is still needed Mobility: bed rest PT consulted: N/A Last date of multidisciplinary goals of care discussion5/15/2022 Code Status: full code Disposition:ICU   Labs   CBC: Recent Labs  Lab 01-Jul-2020 2120 06/07/20 0423 06/08/20 0251 06/09/20 0628 06/10/20 0458 06/11/20 0419  WBC 4.5 5.2 11.2* 10.3 5.9 7.2  NEUTROABS 3.9 4.7 10.3*  --  5.3 6.9  HGB 10.0* 8.2* 9.1* 8.5* 8.2* 8.3*  HCT 32.1* 26.3* 29.9* 27.9* 25.9* 27.4*  MCV 87.7 88.0 90.3 88.6 86.6 90.1  PLT 144* 103* 192 172 122* 149*  Basic Metabolic Panel: Recent Labs  Lab 06/08/20 0251 06/08/20 2140 06/09/20 0628 06/09/20 1748 06/10/20 0458 06/10/20 1705 06/11/20 0419  NA 139  --  144  --  148* 147* 148*  K 4.9  --  4.5  --  3.5 3.8 4.5  CL 101  --  107  --  110 109 110  CO2  27  --  28  --  29 27 29   GLUCOSE 399*  --  216*  --  158* 215* 247*  BUN 54*  --  64*  --  62* 68* 76*  CREATININE 1.38*  --  1.53*  --  1.16 1.21 1.23  CALCIUM 7.7*  --  7.7*  --  7.9* 8.0* 7.9*  MG 2.4   < > 2.6* 2.6* 2.5* 2.5* 2.7*  PHOS 6.3*   < > 5.2* 4.1 2.7 3.4 5.0*   < > = values in this interval not displayed.   GFR: Estimated Creatinine Clearance: 50.3 mL/min (by C-G formula based on SCr of 1.23 mg/dL). Recent Labs  Lab 06/17/2020 2120 06/07/20 0423 06/08/20 0251 06/08/20 0551 06/08/20 2140 06/09/20 0628 06/10/20 0458 06/11/20 0419  PROCALCITON 1.10  --  0.80  --   --  2.66 1.33  --   WBC 4.5   < > 11.2*  --   --  10.3 5.9 7.2  LATICACIDVEN  --   --  3.8* 2.5* 1.2  --   --   --    < > = values in this interval not displayed.    Liver Function Tests: Recent Labs  Lab 06/07/20 0423 06/08/20 0251  AST 48*  41 204*  ALT 21  18 178*  ALKPHOS 58  51 58  BILITOT 0.5  0.5 0.5  PROT 7.8  6.7 6.3*  ALBUMIN 3.3*  2.6* 2.3*   No results for input(s): LIPASE, AMYLASE in the last 168 hours. No results for input(s): AMMONIA in the last 168 hours.  ABG    Component Value Date/Time   PHART 7.32 (L) 06/09/2020 0005   PCO2ART 60 (H) 06/09/2020 0005   PO2ART 106 06/09/2020 0005   HCO3 30.9 (H) 06/09/2020 0005   ACIDBASEDEF 0.2 06/08/2020 0551   O2SAT 97.7 06/09/2020 0005     Coagulation Profile: Recent Labs  Lab 06/08/20 0251  INR 1.2    Cardiac Enzymes: No results for input(s): CKTOTAL, CKMB, CKMBINDEX, TROPONINI in the last 168 hours.  HbA1C: Hgb A1c MFr Bld  Date/Time Value Ref Range Status  06/07/2020 04:23 AM 6.3 (H) 4.8 - 5.6 % Final    Comment:    (NOTE) Pre diabetes:          5.7%-6.4%  Diabetes:              >6.4%  Glycemic control for   <7.0% adults with diabetes     CBG: Recent Labs  Lab 06/10/20 1133 06/10/20 1627 06/10/20 1948 06/10/20 2339 06/11/20 0409  GLUCAP 198* 180* 166* 153* 158*    Allergies Allergies  Allergen  Reactions  . Tamsulosin Hcl Other (See Comments)    Makes blood pressure drop low and pass out       DVT/GI PRX  assessed I Assessed the need for Labs I Assessed the need for Foley I Assessed the need for Central Venous Line Family Discussion when available I Assessed the need for Mobilization I made an Assessment of medications to be adjusted accordingly Safety Risk assessment completed  CASE DISCUSSED IN MULTIDISCIPLINARY ROUNDS WITH  ICU TEAM     Critical Care Time devoted to patient care services described in this note is 65  minutes.  Critical care was necessary to treat or prevent imminent or life-threatening deterioration. Overall, patient is critically ill, prognosis is guarded.  Patient with Multiorgan failure and at high risk for cardiac arrest and death.    Corrin Parker, M.D.  [facility]  Medical Director [place] Director [facility] Cardio-Pulmonary Department

## 2020-06-11 NOTE — Procedures (Signed)
Patient Name: Jermaine Campbell  MRN: [id number]  Epilepsy Attending: Lora Havens  Referring Physician/Provider: Marda Stalker, NP Date: 06/11/2020 Duration:  28.30 mins  Patient history: 75 year old male initially who initially presented to the hospital with Covid-pneumonia and then had an episode of PEA arrest on the 16th.  EEG to evaluate for seizures.  Level of alertness: comatose  AEDs during EEG study: None  Technical aspects: This EEG study was done with scalp electrodes positioned according to the 10-20 International system of electrode placement. Electrical activity was acquired at a sampling rate of 500Hz  and reviewed with a high frequency filter of 70Hz  and a low frequency filter of 1Hz . EEG data were recorded continuously and digitally stored.   Description: EEG showed continuous generalized low amplitude 2 to 3 Hz delta slowing.  EEG was reactive to noxious stimulation.  Hyperventilation and photic stimulation were not performed.     ABNORMALITY - Continuous slow, generalized  IMPRESSION: This study is suggestive of severe diffuse encephalopathy, nonspecific etiology. No seizures or epileptiform discharges were seen throughout the recording.  [name]

## 2020-06-11 NOTE — Consult Note (Addendum)
NEURO HOSPITALIST CONSULT NOTE   Requesting physician: Dr. Mortimer Fries  Reason for Consult: Abnormal MRI finding after cardiac arrest  History obtained from:  Chart     HPI:                                                                                                                                          Jermaine Campbell is an 75 y.o. 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 The patient had tested positive for Covid the day before. EMS had noted the patient to be hypoxic on RA. He was with SVT with rate to 195 requiring 2 doses of IV adenosine then went into a-fib with RVR in the ED. The patient was treated with amiodarone, diltiazem and electrical cardioversion. The patient has been on remdesivir and dexamethasone as well as bronchodilators for treatment of Covid-pneumonia.  Overnight on 5/15 the patient flipped back into A. fib RVR with heart rate in the 170s and associated hypoxia requiring non rebreather.  Subsequently the patient became bradycardic with heart rate in the 20s, followed rapidly by decompensation to a pulseless, unresponsive and apneic state. CPR was initiated and per nursing patient was defibrillated once before code team arrived, unclear what rhythm might have been at that time.  Upon CODE BLUE team arrival patient was in PEA and being bagged with BVM.  The patient received 3 rounds of epinephrine, 2 A of bicarb with Levophed drip started.  Patient was emergently intubated and ROSC was obtained with post rhythm sinus with sinus arrhythmia.  The patient was then transferred to the ICU.   Since then the patient has remained intubated and sedated.  MRI brain obtained today reveals a patch of  slightly hyperintense DWI signal in the posterior body of the left hippocampus. Neurology was called to further evaluate.   Past Medical History:  Diagnosis Date  . Anemia   . Basal cell carcinoma 12/30/2019   L lower pretibia, EDC  . Bradycardia   . Cancer [facility]) 2005   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 s/p chemotherapy  . Cancer of prostate ([place]) 2019  . Coronary artery disease    atherosclerosis of native coronary artery of native heart without angina pectoris  . Deaf    Left ear  . Dysphagia    history of d/t throat cancer  . Dysrhythmia    paroxysmal atrial fibrillation, bradycardia  . H/O syncope    several yrs ago  . History of BPH   .  Hypercholesteremia   . Hypertension   . Hypothyroidism    s/p radiation tx - throat CA  . Melanoma ([place]) 06/11/2018   Melanoma In Situ Posterior neck  . Myocardial infarction ([place])    1996  . Neck stiffness    s/p radiation tx - Throat CA  . Seizures ([place])    last one 2 yrs ago. not medicated. d/t injury to frontal lobe  . Vertigo    no episodes 4-5 yrs/dizziness  . Wears hearing aid    Right ear/ deaf in left ear    Past Surgical History:  Procedure Laterality Date  . BICEPT TENODESIS Right 01/26/2016   Procedure: BICEPS TENODESIS- open;  Surgeon: Corky Mull, MD;  Location: [facility] ORS;  Service: Orthopedics;  Laterality: Right;  . CARDIAC CATHETERIZATION     before CABG/ DR PARACHOS IS CARDIOLOGIST  . CATARACT EXTRACTION W/PHACO Left 07/30/2014   Procedure: CATARACT EXTRACTION PHACO AND INTRAOCULAR LENS PLACEMENT ([place]) SHUGARCAINE;  Surgeon: Leandrew Koyanagi, MD;  Location: [place];  Service: Ophthalmology;  Laterality: Left;  SHUGARCAINE  . CATARACT EXTRACTION W/PHACO Right 10/29/2014   Procedure: CATARACT EXTRACTION PHACO AND INTRAOCULAR LENS PLACEMENT ([facility]);  Surgeon: Leandrew Koyanagi, MD;  Location: [place];  Service: Ophthalmology;  Laterality: Right;  [place]  .  COLONOSCOPY    . COLONOSCOPY WITH PROPOFOL N/A 04/14/2017   Procedure: COLONOSCOPY WITH PROPOFOL;  Surgeon: Manya Silvas, MD;  Location: [facility] ENDOSCOPY;  Service: Endoscopy;  Laterality: N/A;  . CORONARY ARTERY BYPASS GRAFT  1996   3 vessel  . EYE SURGERY Bilateral 2015   cataract extractions  . FRACTURE SURGERY     right arm. as a child  . HOLEP-LASER ENUCLEATION OF THE PROSTATE WITH MORCELLATION N/A 08/23/2017   Procedure: HOLEP-LASER ENUCLEATION OF THE PROSTATE WITH MORCELLATION;  Surgeon: Hollice Espy, MD;  Location: [facility] ORS;  Service: Urology;  Laterality: N/A;  . OPEN SUBSCAPULARIS REPAIR Right 01/26/2016   Procedure: arthroscopic  SUBSCAPULARIS REPAIR;  Surgeon: Corky Mull, MD;  Location: [facility] ORS;  Service: Orthopedics;  Laterality: Right;  . SHOULDER ARTHROSCOPY WITH OPEN ROTATOR CUFF REPAIR Right 01/26/2016   Procedure: SHOULDER ARTHROSCOPY WITH MINI OPEN ROTATOR CUFF REPAIR;  Surgeon: Corky Mull, MD;  Location: [facility] ORS;  Service: Orthopedics;  Laterality: Right;  . SHOULDER ARTHROSCOPY WITH SUBACROMIAL DECOMPRESSION Right 01/26/2016   Procedure: SHOULDER ARTHROSCOPY WITH SUBACROMIAL DECOMPRESSION and debridement;  Surgeon: Corky Mull, MD;  Location: [facility] ORS;  Service: Orthopedics;  Laterality: Right;  . THROAT SURGERY     excision - cancer  . TONSILLECTOMY      Family History  Problem Relation Age of Onset  . Heart attack Father               Social History:  reports that he has never smoked. He has never used smokeless tobacco. He reports that he does not drink alcohol and does not use drugs.  Allergies  Allergen Reactions  . Tamsulosin Hcl Other (See Comments)    Makes blood pressure drop low and pass out    MEDICATIONS:  Scheduled: . vitamin C  500 mg Per Tube Daily  . chlorhexidine gluconate (MEDLINE KIT)  15 mL Mouth Rinse BID  .  Chlorhexidine Gluconate Cloth  6 each Topical Daily  . docusate  100 mg Per Tube BID  . feeding supplement (PROSource TF)  45 mL Per Tube QID  . free water  30 mL Per Tube Q4H  . insulin aspart  0-20 Units Subcutaneous Q4H  . insulin glargine  10 Units Subcutaneous Daily  . levothyroxine  125 mcg Per Tube Q0600  . mouth rinse  15 mL Mouth Rinse 10 times per day  . methylPREDNISolone (SOLU-MEDROL) injection  40 mg Intravenous Q12H  . pantoprazole (PROTONIX) IV  40 mg Intravenous Q24H  . polyethylene glycol  17 g Per Tube Daily  . rivaroxaban  20 mg Per Tube Daily  . simvastatin  40 mg Per Tube Daily  . zinc sulfate  220 mg Per Tube Daily   Continuous: . amiodarone 60 mg/hr (06/11/20 1447)  . feeding supplement (VITAL AF 1.2 CAL) 1,000 mL (06/10/20 1707)  . fentaNYL infusion INTRAVENOUS Stopped (06/11/20 1105)  . midazolam Stopped (06/11/20 1105)  . phenylephrine (NEO-SYNEPHRINE) Adult infusion Stopped (06/10/20 2007)  . propofol (DIPRIVAN) infusion Stopped (06/10/20 1938)  . vasopressin 0.02 Units/min (06/11/20 1400)     ROS:                                                                                                                                       Unable to obtain due to sedation.    Blood pressure 97/75, pulse (!) 111, temperature 98.06 F (36.7 C), resp. rate (!) 22, height 5' 9.02" (1.753 m), weight 76.6 kg, SpO2 97 %.   General Examination:                                                                                                       Physical Exam  HEENT-  [place]/AT. No nuchal rigidity.  Lungs- Intubated Extremities- No edema  Neurological Examination Mental Status: The patient has been off of sedation since 11 AM (7 hours prior to examination) Comatose with no responses to any external stimuli. No attempts to communicate. No purposeful movements.  Cranial Nerves: II: Pupils pinpoint. Difficult to discern any reactivity. Does not track or visually fixate  when eyelids are held open. No blink to threat.   III,IV, VI: No doll's eye reflex. Eyes are conjugate and near the midline.  V,VII: Weak corneal reflex on the  right, minimal corneal reflex on the left.  VIII: No response to voice IX,X: Intubated. No cough or gag reflex but will bite down on ET tube during suctioning.  XI: Head is midline XII: Intubated Motor/Sensory: RUE with flaccid tone and no movement to pinch LUE with flaccid tone and no movement to pinch BLE with flaccid tone and no movement to pinch or noxious plantar stimulation.  Deep Tendon Reflexes: 2+ bilateral brachioradialis, biceps and patellae. Toes mute bilaterally. Cerebellar/Gait: Unable to assess   Lab Results: Basic Metabolic Panel: Recent Labs  Lab 06/08/20 0251 06/08/20 2140 06/09/20 0340 06/09/20 1748 06/10/20 0458 06/10/20 1705 06/11/20 0419  NA 139  --  144  --  148* 147* 148*  K 4.9  --  4.5  --  3.5 3.8 4.5  CL 101  --  107  --  110 109 110  CO2 27  --  28  --  _0 GLUCOSE 399*  --  216*  --  158* 215* 247*  BUN 54*  --  64*  --  62* 68* 76*  CREATININE 1.38*  --  1.53*  --  1.16 1.21 1.23  CALCIUM 7.7*  --  7.7*  --  7.9* 8.0* 7.9*  MG 2.4   < > 2.6* 2.6* 2.5* 2.5* 2.7*  PHOS 6.3*   < > 5.2* 4.1 2.7 3.4 5.0*   < > = values in this interval not displayed.    CBC: Recent Labs  Lab 06/07/2020 2120 06/07/20 0423 06/08/20 0251 06/09/20 0628 06/10/20 0458 06/11/20 0419  WBC 4.5 5.2 11.2* 10.3 5.9 7.2  NEUTROABS 3.9 4.7 10.3*  --  5.3 6.9  HGB 10.0* 8.2* 9.1* 8.5* 8.2* 8.3*  HCT 32.1* 26.3* 29.9* 27.9* 25.9* 27.4*  MCV 87.7 88.0 90.3 88.6 86.6 90.1  PLT 144* 103* 192 172 122* 149*    Cardiac Enzymes: No results for input(s): CKTOTAL, CKMB, CKMBINDEX, TROPONINI in the last 168 hours.  Lipid Panel: Recent Labs  Lab 06/08/20 0251  TRIG 37    Imaging: DG Chest 1 View  Result Date: 06/10/2020 CLINICAL DATA:  Check endotracheal tube EXAM: CHEST  1 VIEW COMPARISON:  06/08/2020  FINDINGS: Endotracheal tube is again seen in satisfactory position 4.4 cm above the carina. Aortic calcifications are noted. Postsurgical changes are seen. Patchy airspace opacity is again identified bilaterally consistent with edema. No new focal abnormality is seen. IMPRESSION: Endotracheal tube in satisfactory position. The remainder of the exam is stable. Electronically Signed   By: Inez Catalina M.D.   On: 06/10/2020 21:57   MR BRAIN WO CONTRAST  Result Date: 06/11/2020 CLINICAL DATA:  Anoxic brain injury. EXAM: MRI HEAD WITHOUT CONTRAST TECHNIQUE: Multiplanar, multiecho pulse sequences of the brain and surrounding structures were obtained without intravenous contrast. COMPARISON:  None. FINDINGS: Brain: There is abnormal diffusion restriction within the medial left temporal lobe (5:22). No acute or chronic hemorrhage. There is encephalomalacia of both frontal lobes, right greater than left. There is multifocal periventricular white matter hyperintensity, most often a result of chronic microvascular ischemia. The midline structures are normal. Vascular: Major flow voids are preserved. Skull and upper cervical spine: Normal calvarium and skull base. Visualized upper cervical spine and soft tissues are normal. Sinuses/Orbits:No paranasal sinus fluid levels or advanced mucosal thickening. No mastoid or middle ear effusion. Normal orbits. IMPRESSION: 1. Small area of abnormal diffusion restriction in the medial left temporal lobe. This could indicate a focus of acute ischemia, postictal phenomenon or be secondary  to anoxic brain injury. 2. Bilateral frontal encephalomalacia, right greater than left, likely a sequela of remote trauma. Electronically Signed   By: Ulyses Jarred M.D.   On: 06/11/2020 02:28   ECHOCARDIOGRAM COMPLETE  Result Date: 06/09/2020    ECHOCARDIOGRAM REPORT   Patient Name:   Jermaine Campbell Date of Exam: 06/09/2020 Medical Rec #:  [id number]        Height:       69.0 in Accession #:     [id number]       Weight:       169.5 lb Date of Birth:  1946/01/07        BSA:          1.926 m Patient Age:    17 years         BP:           110/58 mmHg Patient Gender: M                HR:           103 bpm. Exam Location:  [facility] Procedure: 2D Echo, Color Doppler and Cardiac Doppler Indications:     Cardiac arrest I45.9  History:         Patient has no prior history of Echocardiogram examinations.                  Previous Myocardial Infarction; Risk Factors:Hypertension.  Sonographer:     Sherrie Sport RDCS (AE) Referring Phys:  [id number] Silt L [place] Diagnosing Phys: Bartholome Bill MD  Sonographer Comments: Echo performed with patient supine and on artificial respirator. IMPRESSIONS  1. Left ventricular ejection fraction, by estimation, is 40 to 45%. The left ventricle has mildly decreased function. The left ventricle demonstrates global hypokinesis. There is moderate left ventricular hypertrophy. Left ventricular diastolic parameters are consistent with Grade I diastolic dysfunction (impaired relaxation).  2. Right ventricular systolic function is normal. The right ventricular size is mildly enlarged.  3. Left atrial size was mildly dilated.  4. Right atrial size was mildly dilated.  5. The mitral valve is grossly normal. Trivial mitral valve regurgitation.  6. The aortic valve is calcified. Aortic valve regurgitation is trivial. Mild to moderate aortic valve sclerosis/calcification is present, without any evidence of aortic stenosis. FINDINGS  Left Ventricle: Left ventricular ejection fraction, by estimation, is 40 to 45%. The left ventricle has mildly decreased function. The left ventricle demonstrates global hypokinesis. The left ventricular internal cavity size was normal in size. There is  moderate left ventricular hypertrophy. Left ventricular diastolic parameters are consistent with Grade I diastolic dysfunction (impaired relaxation). Right Ventricle: The right ventricular size is mildly enlarged. No  increase in right ventricular wall thickness. Right ventricular systolic function is normal. Left Atrium: Left atrial size was mildly dilated. Right Atrium: Right atrial size was mildly dilated. Pericardium: There is no evidence of pericardial effusion. Mitral Valve: The mitral valve is grossly normal. Trivial mitral valve regurgitation. Tricuspid Valve: The tricuspid valve is not well visualized. Tricuspid valve regurgitation is trivial. Aortic Valve: The aortic valve is calcified. Aortic valve regurgitation is trivial. Mild to moderate aortic valve sclerosis/calcification is present, without any evidence of aortic stenosis. Aortic valve mean gradient measures 11.7 mmHg. Aortic valve peak gradient measures 19.2 mmHg. Aortic valve area, by VTI measures 1.42 cm. Pulmonic Valve: The pulmonic valve was not assessed. Pulmonic valve regurgitation is not visualized. Aorta: The aortic root is normal in size and structure. IAS/Shunts: The interatrial septum was not  assessed.  LEFT VENTRICLE PLAX 2D LVIDd:         4.09 cm LVIDs:         3.47 cm LV PW:         1.40 cm LV IVS:        1.74 cm LVOT diam:     2.15 cm LV SV:         45 LV SV Index:   24 LVOT Area:     3.63 cm  LEFT ATRIUM             Index       RIGHT ATRIUM           Index LA diam:        4.00 cm 2.08 cm/m  RA Area:     19.80 cm LA Vol (A2C):   46.8 ml 24.30 ml/m RA Volume:   55.50 ml  28.82 ml/m LA Vol (A4C):   50.8 ml 26.38 ml/m LA Biplane Vol: 52.3 ml 27.16 ml/m  AORTIC VALVE                    PULMONIC VALVE AV Area (Vmax):    1.27 cm     PV Vmax:        0.60 m/s AV Area (Vmean):   1.26 cm     PV Peak grad:   1.4 mmHg AV Area (VTI):     1.42 cm     RVOT Peak grad: 2 mmHg AV Vmax:           219.00 cm/s AV Vmean:          152.000 cm/s AV VTI:            0.320 m AV Peak Grad:      19.2 mmHg AV Mean Grad:      11.7 mmHg LVOT Vmax:         76.40 cm/s LVOT Vmean:        52.700 cm/s LVOT VTI:          0.125 m LVOT/AV VTI ratio: 0.39  AORTA Ao Root diam:  3.40 cm MITRAL VALVE               TRICUSPID VALVE MV Area (PHT): 6.37 cm    TR Peak grad:   9.2 mmHg MV Decel Time: 119 msec    TR Vmax:        152.00 cm/s MV E velocity: 97.30 cm/s                            SHUNTS                            Systemic VTI:  0.12 m                            Systemic Diam: 2.15 cm Bartholome Bill MD Electronically signed by Bartholome Bill MD Signature Date/Time: 06/09/2020/12:10:29 PM    Final     Assessment: 75 year old male initially who initially presented to the hospital with Covid-pneumonia and then had an episode of PEA arrest on the 16th. He is currently intubated and unresponsive in the ICU with progressive multiorgan failure. Neurology was called to evaluate after MRI brain revealed a faint patch of diffusion signal abnormality within the left hippocampus.  1. Exam reveals a  comatose patient with impaired brainstem reflexes, flaccid extremities and no nuchal rigidity. Exam findings best localize as diffuse cerebral hemisphere and brainstem dysfunction. Most likely etiology is hypoxic-ischemic injury. Also possible would be prolonged seizure activity with dense postictal state, given the left hippocampal signal abnormality seen on MRI, which could represent seizure-related hippocampal edema.  2. MRI brain: Small area of abnormal diffusion restriction in the medial left temporal lobe. This could indicate a focus of acute ischemia, postictal phenomenon or be secondary to anoxic brain injury. Bilateral frontal encephalomalacia, right greater than left, likely a sequela of remote trauma.  3. TTE: Abnormalities are present, but no mural thrombus or valvular vegetation is noted.  4. Comorbidities include ARDS due to Covid-pneumonia with severe acute hypoxic and hypercapnic respiratory failure, ischemic cardiomyopathy with acute systolic dysfunction, AKI and septic shock   Recommendations: 1. EEG completed with report pending.  2. Frequent neuro checks 3. Keep off sedation  and attempt to wean off ventilator if possible.   35 minutes spent in the neurological evaluation and management of this critically ill patient.   Addendum: EEG reveals continuous slowing, generalized. This study is suggestive of severe diffuse encephalopathy, nonspecific to etiology. No seizures or epileptiform discharges were seen throughout the recording.    Electronically signed: Dr. Kerney Elbe 06/11/2020, 8:16 AM

## 2020-06-11 NOTE — Progress Notes (Signed)
Patient transported to MRI without complication. Vitals stable. Upon return, placed patient back on current settings. No complications. Will continue to monitor.

## 2020-06-11 NOTE — Plan of Care (Signed)
Pt's sedation turned off at 11:05.  As of writing he is not responding to pain, no cough/gag to deep oral suctioning.  Possible minimal twitching to corneal stimulation but difficult to confirm. Family at hospital.  Wife assisted by me to don PPE so she could visit him.  She was only able to tolerate being in the room about 10 minutes.  She is very SOB, which her daughter report is her baseline d/t being a heavy smoker.  His pastor also visited. Daughters want their mother to be the patient's visitor. FIO2 titrated down to 40% today.   With sedation off: Pt overbreathing the vent  2-4 times per minute, his Afib is more rapid (110-125 BPM) and his BP is WDL with no pressor support.

## 2020-06-11 NOTE — Progress Notes (Signed)
Patient HR up to 150's and SBP up to 180's. Eyes flutter to deep suction, positive cough, and biting on ETT. Patient moving any extremities or following commands. Patient is also stacking breaths, Fentanyl restarted.

## 2020-06-11 NOTE — Progress Notes (Signed)
GOALS OF CARE DISCUSSION  The Clinical status was relayed to family in detail. Wife over the phone Updated and notified of patients medical condition.    Patient remains unresponsive and will not open eyes to command.   Patient is having a weak cough and struggling to remove secretions.   Patient with increased WOB and using accessory muscles to breathe Explained to family course of therapy and the modalities    Patient with Progressive multiorgan failure with a very high probablity of a very minimal chance of meaningful recovery despite all aggressive and optimal medical therapy.  PATIENT REMAINS FULL CODE  Family understands the situation. Follow up Neuro recs Follow up EEG Prognosis is very poor   Family are satisfied with Plan of action and management. All questions answered  Additional CC time 35 mins   Tannor Pyon Patricia Pesa, M.D.  [facility]  Medical Director [place] [facility] Cardio-Pulmonary Department

## 2020-06-12 DIAGNOSIS — J1282 Pneumonia due to coronavirus disease 2019: Secondary | ICD-10-CM | POA: Diagnosis not present

## 2020-06-12 DIAGNOSIS — U071 COVID-19: Secondary | ICD-10-CM | POA: Diagnosis not present

## 2020-06-12 LAB — BASIC METABOLIC PANEL
Anion gap: 8 (ref 5–15)
BUN: 69 mg/dL — ABNORMAL HIGH (ref 8–23)
CO2: 30 mmol/L (ref 22–32)
Calcium: 8.3 mg/dL — ABNORMAL LOW (ref 8.9–10.3)
Chloride: 112 mmol/L — ABNORMAL HIGH (ref 98–111)
Creatinine, Ser: 1.01 mg/dL (ref 0.61–1.24)
GFR, Estimated: >60 mL/min (ref >60)
Glucose, Bld: 235 mg/dL — ABNORMAL HIGH (ref 70–99)
Potassium: 3.6 mmol/L (ref 3.5–5.1)
Sodium: 150 mmol/L — ABNORMAL HIGH (ref 135–145)

## 2020-06-12 LAB — BLOOD GAS, ARTERIAL
Acid-Base Excess: 10.1 mmol/L — ABNORMAL HIGH (ref 0.0–2.0)
Bicarbonate: 34.3 mmol/L — ABNORMAL HIGH (ref 20.0–28.0)
FIO2: 0.4
MECHVT: 420 mL
O2 Saturation: 94 %
PEEP: 8 cmH2O
Patient temperature: 37
RATE: 28 resp/min
pCO2 arterial: 44 mmHg (ref 32.0–48.0)
pH, Arterial: 7.5 — ABNORMAL HIGH (ref 7.350–7.450)
pO2, Arterial: 64 mmHg — ABNORMAL LOW (ref 83.0–108.0)

## 2020-06-12 LAB — CBC WITH DIFFERENTIAL/PLATELET
Abs Immature Granulocytes: 0.06 10*3/uL (ref 0.00–0.07)
Basophils Absolute: 0 10*3/uL (ref 0.0–0.1)
Basophils Relative: 0 %
Eosinophils Absolute: 0 10*3/uL (ref 0.0–0.5)
Eosinophils Relative: 0 %
HCT: 26.6 % — ABNORMAL LOW (ref 39.0–52.0)
Hemoglobin: 8.3 g/dL — ABNORMAL LOW (ref 13.0–17.0)
Immature Granulocytes: 1 %
Lymphocytes Relative: 2 %
Lymphs Abs: 0.2 10*3/uL — ABNORMAL LOW (ref 0.7–4.0)
MCH: 27.4 pg (ref 26.0–34.0)
MCHC: 31.2 g/dL (ref 30.0–36.0)
MCV: 87.8 fL (ref 80.0–100.0)
Monocytes Absolute: 0.2 10*3/uL (ref 0.1–1.0)
Monocytes Relative: 2 %
Neutro Abs: 10.7 10*3/uL — ABNORMAL HIGH (ref 1.7–7.7)
Neutrophils Relative %: 95 %
Platelets: 168 10*3/uL (ref 150–400)
RBC: 3.03 MIL/uL — ABNORMAL LOW (ref 4.22–5.81)
RDW: 20 % — ABNORMAL HIGH (ref 11.5–15.5)
WBC: 11.2 10*3/uL — ABNORMAL HIGH (ref 4.0–10.5)
nRBC: 0.8 % — ABNORMAL HIGH (ref 0.0–0.2)

## 2020-06-12 LAB — GLUCOSE, CAPILLARY
Glucose-Capillary: 181 mg/dL — ABNORMAL HIGH (ref 70–99)
Glucose-Capillary: 225 mg/dL — ABNORMAL HIGH (ref 70–99)
Glucose-Capillary: 211 mg/dL — ABNORMAL HIGH (ref 70–99)
Glucose-Capillary: 226 mg/dL — ABNORMAL HIGH (ref 70–99)
Glucose-Capillary: 237 mg/dL — ABNORMAL HIGH (ref 70–99)
Glucose-Capillary: 237 mg/dL — ABNORMAL HIGH (ref 70–99)

## 2020-06-12 LAB — PROCALCITONIN: Procalcitonin: 0.75 ng/mL

## 2020-06-12 LAB — PHOSPHORUS: Phosphorus: 2.6 mg/dL (ref 2.5–4.6)

## 2020-06-12 LAB — MAGNESIUM: Magnesium: 2.7 mg/dL — ABNORMAL HIGH (ref 1.7–2.4)

## 2020-06-12 MED ORDER — DEXAMETHASONE SODIUM PHOSPHATE 10 MG/ML IJ SOLN
6.0000 mg | INTRAMUSCULAR | Status: DC
  Administered 2020-06-12 – 2020-06-13 (×2): 6 mg via INTRAVENOUS
  Filled 2020-06-12 (×3): qty 0.6

## 2020-06-12 MED ORDER — DEXMEDETOMIDINE HCL IN NACL 400 MCG/100ML IV SOLN
0.4000 ug/kg/h | INTRAVENOUS | Status: DC
  Administered 2020-06-12: 1 ug/kg/h via INTRAVENOUS
  Administered 2020-06-12: 0.6 ug/kg/h via INTRAVENOUS
  Administered 2020-06-12: 0.7 ug/kg/h via INTRAVENOUS
  Administered 2020-06-12: 0.8 ug/kg/h via INTRAVENOUS
  Administered 2020-06-13 – 2020-06-14 (×6): 1.2 ug/kg/h via INTRAVENOUS
  Administered 2020-06-14: 1.4 ug/kg/h via INTRAVENOUS
  Filled 2020-06-12 (×10): qty 100

## 2020-06-12 MED ORDER — HYDRALAZINE HCL 20 MG/ML IJ SOLN
10.0000 mg | Freq: Once | INTRAMUSCULAR | Status: AC
  Administered 2020-06-12: 10 mg via INTRAVENOUS
  Filled 2020-06-12: qty 1

## 2020-06-12 MED ORDER — FENTANYL CITRATE (PF) 100 MCG/2ML IJ SOLN
50.0000 ug | INTRAMUSCULAR | Status: DC | PRN
  Administered 2020-06-12: 50 ug via INTRAVENOUS
  Filled 2020-06-12: qty 2

## 2020-06-12 MED ORDER — INSULIN ASPART 100 UNIT/ML IJ SOLN
3.0000 [IU] | INTRAMUSCULAR | Status: DC
  Administered 2020-06-12 – 2020-06-14 (×10): 3 [IU] via SUBCUTANEOUS
  Filled 2020-06-12 (×9): qty 1

## 2020-06-12 MED ORDER — METOPROLOL TARTRATE 5 MG/5ML IV SOLN
2.5000 mg | Freq: Once | INTRAVENOUS | Status: AC
  Administered 2020-06-12: 2.5 mg via INTRAVENOUS
  Filled 2020-06-12: qty 5

## 2020-06-12 MED ORDER — FREE WATER
200.0000 mL | Status: DC
  Administered 2020-06-12 – 2020-06-14 (×13): 200 mL

## 2020-06-12 NOTE — Progress Notes (Signed)
SBP via aline 180's-190's, SBP via NIVBP 140's. HR 110-140's. PRN bolus of Fentanyl ineffective in decreasing BP, Dana NP aware, new orders received.

## 2020-06-12 NOTE — Plan of Care (Signed)
PT turned Q2H this shift, minimally responsive to oral care/suctioning by biting down on tube.  Still not following commands/opening eyes/withdrawing from pain.  Precedex started this AM and HR and BP are much improved, AFib continues.  Pt did appear to become more responsive when his wife visited and spoke to him,  was grimacing and asynchronous with the vent.  Daughter Jermaine Campbell states her sister will be in town tonight and they intend to talk as a family and decide what is the best course for Jermaine Campbell going forward.

## 2020-06-12 NOTE — Progress Notes (Signed)
Inpatient Diabetes Program Recommendations  AACE/ADA: New Consensus Statement on Inpatient Glycemic Control (2015)  Target Ranges:  Prepandial:   less than 140 mg/dL      Peak postprandial:   less than 180 mg/dL (1-2 hours)      Critically ill patients:  140 - 180 mg/dL   Lab Results  Component Value Date   GLUCAP 225 (H) 06/12/2020   HGBA1C 6.3 (H) 06/07/2020    Review of Glycemic Control Results for Jermaine Campbell, Jermaine "DAVE" (MRN [id number]) as of 06/12/2020 08:52  Ref. Range 06/11/2020 15:59 06/11/2020 20:25 06/11/2020 23:49 06/12/2020 03:23 06/12/2020 08:05  Glucose-Capillary Latest Ref Range: 70 - 99 mg/dL 259 (H) 272 (H) 235 (H) 181 (H) 225 (H)   Diabetes history: DM 2 Outpatient Diabetes medications:  Metformin 500 mg bid Current orders for Inpatient glycemic control:  Novolog resistant q 4 hours Vital 50 ml/hr Lantus 10 units daily Solumedrol 40 mg IV q 12 hours Inpatient Diabetes Program Recommendations:   Consider adding Novolog tube feed coverage 3 units q 4 hours.   Thanks,  Adah Perl, RN, BC-ADM Inpatient Diabetes Coordinator Pager [phone] (8a-5p)

## 2020-06-12 NOTE — Progress Notes (Signed)
NAME:  Jermaine Campbell, MRN:  [id number], DOB:  [birth year], LOS: 6 ADMISSION DATE:  05/28/2020, CONSULTATION DATE:  06/08/20 REFERRING MD: Jonny Ruiz, NP, CHIEF COMPLAINT: Shortness of breath  History of Present Illness:  75 year old male presenting to [facility] ED from home with complaints of progressive shortness of breath over the past 5 days with associated new onset of nonproductive cough, subjective fever/chills.  Patient underwent outpatient testing at [facility] on 06/05/2020 and was found to be positive for COVID-19.  Due to subsequent further progression of symptoms he arrived at [facility] ED for further evaluation. ED course:  Patient found to be in SVT at 195 receiving 2 doses of IV adenosine which then revealed A. fib RVR.  Dr. Nehemiah Massed with cardiology recommended electrocardioversion which was completed successfully in the ED and ensuing sinus rhythm.  Cardiology additionally recommended the initiation of an amiodarone bolus followed by continuous amiodarone and diltiazem drips.  Initial vitals: T 99, tachypneic at 36, tachycardic 195, BP 158/127 with SPO2 at 88% on room air Significant labs: BUN 48, CRP 29.4, PCT 1.10, D-dimer 2.21, fibrinogen greater than 800.  Chest x-ray showing bilateral airspace opacities greater on the right without consolidation concerning for multifocal atypical pneumonia and mild underlying interstitial edema possible. TRH consulted for admission to PCU.  Hospital course: Patient started on dexamethasone and remdesivir as well as bronchodilators.  Amiodarone and diltiazem drips continued with home Xarelto for atrial fibrillation.  Overnight on 06/07/2020 per nursing patient flipped into A. fib RVR with heart rate in the 170s and associated hypoxia requiring non rebreather.  Subsequently the patient became bradycardic with heart rate in the 20s and when nursing arrived bedside to assess the patient and found him pulseless, unresponsive and apneic.  CPR was initiated and  per nursing patient was defibrillated once before code team arrived, unclear what rhythm might have been at that time.  Upon CODE BLUE team arrival patient was in PEA and being bagged with BVM.  The patient received 3 rounds of epinephrine, 2 A of bicarb with Levophed drip started.  Patient was emergently intubated and ROSC was obtained with post rhythm sinus with sinus arrhythmia.  Patient urgently transferred to ICU with PCCM consulted.  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 05/28/2020-patient admitted to PCU COVID-19 positive with Pneumonia & A. fib RVR requiring amiodarone and diltiazem drips status post cardioversion. . 06/08/20-overnight patient developed A. fib RVR, became hypoxic and ultimately bradycardic before PEA arrest staying requiring emergent intubation and mechanical ventilation with transfer to ICU . 06/09/2020: Currently supine, has tolerated well.  FiO2 requirements down.  Cardiac rhythm still erratic LVEF 40 to 45% down from 2019.    Interim History / Subjective:  Pt remains intubated had an episode of hypertension sbp via aline 180's-190's amd via NIVBP 140's requiring 10 mg iv hydralazine x1 dose with improvement.  Remains in supine position tolerating well.  Attempted to keep sedation off overnight, however hr increased to the 150's, sbp increased to 180's, and pt started stacking breaths on the ventilator.  Pt noted to have eye fluttering to deep suction, gag reflex, and started biting on ETT   Objective  Blood pressure 110/62, pulse (!) 105, temperature 98.42 F (36.9 C),  resp. rate (!) 24, height 5' 9.02" (1.753 m), weight 77.9 kg, SpO2 94 %.    Vent Mode: PRVC FiO2 (%):  [40 %-50 %] 40 % Set Rate:  [22 bmp] 22 bmp Vt Set:  [420 mL] 420 mL PEEP:  [5 cmH20-10 cmH20] 5 cmH20 Plateau Pressure:  [11 cmH20-18 cmH20] 11 cmH20   Intake/Output Summary (Last 24 hours) at 06/12/2020 0539 Last data filed at 06/12/2020 0530 Gross per 24 hour  Intake 2130.61 ml  Output 2090 ml  Net 40.61 ml   Filed Weights   06/08/20 0100 06/10/20 0500 06/12/20 0326  Weight: 76.9 kg 76.6 kg 77.9 kg    Examination: Physical examination is limited due to need for PPE/CAPR General: Adult male, critically ill, lying in bed intubated & sedated requiring mechanical ventilation, NAD HEENT: MM pink/moist, anicteric, atraumatic, neck supple Neuro: Intubated and sedated, unable to follow commands, bilateral pupils pinpoint reactive  CV: irregular irregular, no R/G, 2+ right radial 1+ left radial/2+ distal pulses  Pulm: diminished throughout, even, non labored mechanically ventilated  GI: hypoactive BS x4, soft, non distended  GU: foley in place with clear yellow urine Skin: left cheek abrasion  Extremities: warm/dry, no edema present   Labs/imaging that I have personally reviewed  (right click and "Reselect all SmartList Selections" daily)  EKG Interpretation Date:  06/08/2020 EKG Time:  0044 Rate:  100 Rhythm: NSR with PVCs & PACs QRS Axis:  Normal Intervals: Prolonged QTC at 516 ST/T Wave abnormalities: None Narrative Interpretation: Sinus rhythm with PVCs and PACs, prolonged QTC CXR 06/08/2020: Diffuse interstitial opacity, small pleural effusions Labs 06/12/20: Na+ 150, chloride 112, glucose 235, wbc 11.2, hgb 8.3,   Resolved Hospital Problem list     Assessment & Plan:  Acute Hypoxic Respiratory Failure secondary to COVID-19 Bacterial Pneumonia  PMHx:  - Ventilator settings: PRVC  6 mL/kg, FiO2: 100%, PEEP 10, continue lung protective strategies  - Wean PEEP & FiO2 as  tolerated to maintain O2 sats >88% - Goal plateau pressure < 30, driving pressure < 15 - Consider continuous paralytics if needed for vent synchrony/gas exchange - Cycle prone positioning if P/F ratio < for oxygenation - Decreasing RASS goal to -1 to -2: Fentanyl gtt attempting to wean off - Vecuronium IVP Q 1h PRN for ventilator asynchrony - Diuresis as tolerated, goal CVP 5-8.   - Continue VAP prevention protocol - Continue iv steroids wean as tolerated  - Follow inflammatory markers: Ferritin, D-dimer, CRP, LDH - continue Vitamin C & zinc - f/u cultures, monitor WBC/ fever curve, trend PCT - Intermittent CXR & ABG PRN  PEA arrest with shock Chronic Atrial Fibrillation with Rapid Ventricular Response ~Echocardiogram shows LVEF 40 to 45%, global HK, grade 1 DD, biatrial enlargement - Pt with poor rate control amiodarone continued @60  mg/hr for now along with intermittent iv digoxin  - Will check digoxin level in the am  - Continue Xarelto - Cardiology consulted, appreciate input - Continuous cardiac monitoring  Hypernatremia  -Trend BMP  -Will increase free water flushes to 200 ml q4hrs   Steroid-induced hyperglycemia in the setting of T2DM -CBG monitor every 4hrs  -SSI -Lantus 10 units daily -Follow ICU hypo-/hyperglycemia protocol  Hypothyroidism -Continue Synthroid  Nutrition -Continue TF's   Acute encephalopathy possibly secondary to acute ischemia, postictal phenomenon or anoxic brain injury~MRI Brain 06/11/20 revealed small area of abnormal diffusion restriction in the medial left temporal lobe. This could indicate  a focus of acute ischemia, postictal phenomenon or be secondary to anoxic brain injury. Bilateral frontal encephalomalacia, right greater than left, likely a sequela of remote trauma. -Wean sedation off as tolerated  -Frequent neuro checks -Neurology consulted appreciate input  -EEG results pending    Best practice (right click and "Reselect all  SmartList Selections" daily)  Diet:  NPO Pain/Anxiety/Delirium protocol (if indicated): Yes (RASS goal -2) VAP protocol (if indicated): Yes DVT prophylaxis: Systemic AC GI prophylaxis: PPI Glucose control:  SSI Yes Central venous access:  Yes, and it is still needed Arterial line:  Yes, and it is still needed Foley:  Yes, and it is still needed Mobility:  bed rest  PT consulted: N/A Last date of multidisciplinary goals of care discussion 06/07/2020 Code Status:  full code Disposition: ICU  Labs   CBC: Recent Labs  Lab 06/07/20 0423 06/08/20 0251 06/09/20 0628 06/10/20 0458 06/11/20 0419 06/12/20 0518  WBC 5.2 11.2* 10.3 5.9 7.2 11.2*  NEUTROABS 4.7 10.3*  --  5.3 6.9 PENDING  HGB 8.2* 9.1* 8.5* 8.2* 8.3* 8.3*  HCT 26.3* 29.9* 27.9* 25.9* 27.4* 26.6*  MCV 88.0 90.3 88.6 86.6 90.1 87.8  PLT 103* 192 172 122* 149* 161    Basic Metabolic Panel: Recent Labs  Lab 06/08/20 0251 06/08/20 2140 06/09/20 0628 06/09/20 1748 06/10/20 0458 06/10/20 1705 06/11/20 0419  NA 139  --  144  --  148* 147* 148*  K 4.9  --  4.5  --  3.5 3.8 4.5  CL 101  --  107  --  110 109 110  CO2 27  --  28  --  $R'29 27 29  'Kb$ GLUCOSE 399*  --  216*  --  158* 215* 247*  BUN 54*  --  64*  --  62* 68* 76*  CREATININE 1.38*  --  1.53*  --  1.16 1.21 1.23  CALCIUM 7.7*  --  7.7*  --  7.9* 8.0* 7.9*  MG 2.4   < > 2.6* 2.6* 2.5* 2.5* 2.7*  PHOS 6.3*   < > 5.2* 4.1 2.7 3.4 5.0*   < > = values in this interval not displayed.   GFR: Estimated Creatinine Clearance: 50.3 mL/min (by C-G formula based on SCr of 1.23 mg/dL). Recent Labs  Lab 06/08/20 0251 06/08/20 0551 06/08/20 2140 06/09/20 0628 06/10/20 0458 06/11/20 0419 06/12/20 0518  PROCALCITON 0.80  --   --  2.66 1.33 1.02  --   WBC 11.2*  --   --  10.3 5.9 7.2 11.2*  LATICACIDVEN 3.8* 2.5* 1.2  --   --   --   --     Liver Function Tests: Recent Labs  Lab 06/07/20 0423 06/08/20 0251  AST 48*  41 204*  ALT 21  18 178*  ALKPHOS 58  51  58  BILITOT 0.5  0.5 0.5  PROT 7.8  6.7 6.3*  ALBUMIN 3.3*  2.6* 2.3*   No results for input(s): LIPASE, AMYLASE in the last 168 hours. No results for input(s): AMMONIA in the last 168 hours.  ABG    Component Value Date/Time   PHART 7.33 (L) 06/11/2020 0751   PCO2ART 65 (H) 06/11/2020 0751   PO2ART 88 06/11/2020 0751   HCO3 34.3 (H) 06/11/2020 0751   ACIDBASEDEF 0.2 06/08/2020 0551   O2SAT 96.1 06/11/2020 0751     Coagulation Profile: Recent Labs  Lab 06/08/20 0251  INR 1.2    Cardiac Enzymes: No results for input(s): CKTOTAL, CKMB, CKMBINDEX,  TROPONINI in the last 168 hours.  HbA1C: Hgb A1c MFr Bld  Date/Time Value Ref Range Status  06/07/2020 04:23 AM 6.3 (H) 4.8 - 5.6 % Final    Comment:    (NOTE) Pre diabetes:          5.7%-6.4%  Diabetes:              >6.4%  Glycemic control for   <7.0% adults with diabetes     CBG: Recent Labs  Lab 06/11/20 1125 06/11/20 1559 06/11/20 2025 06/11/20 2349 06/12/20 0323  GLUCAP 218* 259* 272* 235* 181*    Review of Systems:   Patient intubated and sedated unable to participate    Allergies Allergies  Allergen Reactions  . Tamsulosin Hcl Other (See Comments)    Makes blood pressure drop low and pass out     Scheduled Meds: . vitamin C  500 mg Per Tube Daily  . chlorhexidine gluconate (MEDLINE KIT)  15 mL Mouth Rinse BID  . Chlorhexidine Gluconate Cloth  6 each Topical Daily  . docusate  100 mg Per Tube BID  . feeding supplement (PROSource TF)  45 mL Per Tube QID  . free water  30 mL Per Tube Q4H  . insulin aspart  0-20 Units Subcutaneous Q4H  . insulin glargine  10 Units Subcutaneous Daily  . levothyroxine  125 mcg Per Tube Q0600  . mouth rinse  15 mL Mouth Rinse 10 times per day  . methylPREDNISolone (SOLU-MEDROL) injection  40 mg Intravenous Q12H  . pantoprazole (PROTONIX) IV  40 mg Intravenous Q24H  . polyethylene glycol  17 g Per Tube Daily  . rivaroxaban  20 mg Per Tube Daily  . simvastatin   40 mg Per Tube Daily  . zinc sulfate  220 mg Per Tube Daily   Continuous Infusions: . amiodarone 60 mg/hr (06/12/20 0500)  . feeding supplement (VITAL AF 1.2 CAL) 1,000 mL (06/11/20 1611)  . fentaNYL infusion INTRAVENOUS 200 mcg/hr (06/12/20 0500)  . midazolam Stopped (06/11/20 1821)  . phenylephrine (NEO-SYNEPHRINE) Adult infusion Stopped (06/10/20 2007)  . propofol (DIPRIVAN) infusion Stopped (06/10/20 1938)  . vasopressin Stopped (06/11/20 1444)   PRN Meds:.acetaminophen **OR** acetaminophen, albuterol, fentaNYL, midazolam, vecuronium   Marda Stalker, AGNP  Pulmonary/Critical Care Pager [phone] (please enter 7 digits) PCCM Consult Pager [phone] (please enter 7 digits)

## 2020-06-13 DIAGNOSIS — J1282 Pneumonia due to coronavirus disease 2019: Secondary | ICD-10-CM | POA: Diagnosis not present

## 2020-06-13 DIAGNOSIS — U071 COVID-19: Secondary | ICD-10-CM | POA: Diagnosis not present

## 2020-06-13 LAB — GLUCOSE, CAPILLARY
Glucose-Capillary: 243 mg/dL — ABNORMAL HIGH (ref 70–99)
Glucose-Capillary: 220 mg/dL — ABNORMAL HIGH (ref 70–99)
Glucose-Capillary: 172 mg/dL — ABNORMAL HIGH (ref 70–99)
Glucose-Capillary: 165 mg/dL — ABNORMAL HIGH (ref 70–99)
Glucose-Capillary: 162 mg/dL — ABNORMAL HIGH (ref 70–99)

## 2020-06-13 LAB — CBC WITH DIFFERENTIAL/PLATELET
Abs Immature Granulocytes: 0.09 10*3/uL — ABNORMAL HIGH (ref 0.00–0.07)
Basophils Absolute: 0 10*3/uL (ref 0.0–0.1)
Basophils Relative: 0 %
Eosinophils Absolute: 0 10*3/uL (ref 0.0–0.5)
Eosinophils Relative: 0 %
HCT: 24.7 % — ABNORMAL LOW (ref 39.0–52.0)
Hemoglobin: 7.7 g/dL — ABNORMAL LOW (ref 13.0–17.0)
Immature Granulocytes: 1 %
Lymphocytes Relative: 2 %
Lymphs Abs: 0.2 10*3/uL — ABNORMAL LOW (ref 0.7–4.0)
MCH: 27.2 pg (ref 26.0–34.0)
MCHC: 31.2 g/dL (ref 30.0–36.0)
MCV: 87.3 fL (ref 80.0–100.0)
Monocytes Absolute: 0.2 10*3/uL (ref 0.1–1.0)
Monocytes Relative: 2 %
Neutro Abs: 10.5 10*3/uL — ABNORMAL HIGH (ref 1.7–7.7)
Neutrophils Relative %: 95 %
Platelets: 161 10*3/uL (ref 150–400)
RBC: 2.83 MIL/uL — ABNORMAL LOW (ref 4.22–5.81)
RDW: 19.9 % — ABNORMAL HIGH (ref 11.5–15.5)
WBC: 11 10*3/uL — ABNORMAL HIGH (ref 4.0–10.5)
nRBC: 0.7 % — ABNORMAL HIGH (ref 0.0–0.2)

## 2020-06-13 LAB — BASIC METABOLIC PANEL
Anion gap: 7 (ref 5–15)
BUN: 76 mg/dL — ABNORMAL HIGH (ref 8–23)
CO2: 31 mmol/L (ref 22–32)
Calcium: 7.9 mg/dL — ABNORMAL LOW (ref 8.9–10.3)
Chloride: 111 mmol/L (ref 98–111)
Creatinine, Ser: 1.06 mg/dL (ref 0.61–1.24)
GFR, Estimated: >60 mL/min (ref >60)
Glucose, Bld: 261 mg/dL — ABNORMAL HIGH (ref 70–99)
Potassium: 3.8 mmol/L (ref 3.5–5.1)
Sodium: 149 mmol/L — ABNORMAL HIGH (ref 135–145)

## 2020-06-13 LAB — PHOSPHORUS: Phosphorus: 3.1 mg/dL (ref 2.5–4.6)

## 2020-06-13 LAB — DIGOXIN LEVEL: Digoxin Level: 0.6 ng/mL — ABNORMAL LOW (ref 0.8–2.0)

## 2020-06-13 LAB — MAGNESIUM: Magnesium: 2.8 mg/dL — ABNORMAL HIGH (ref 1.7–2.4)

## 2020-06-13 LAB — C-REACTIVE PROTEIN: CRP: 26.7 mg/dL — ABNORMAL HIGH (ref <1.0)

## 2020-06-13 LAB — PROCALCITONIN: Procalcitonin: 0.53 ng/mL

## 2020-06-13 MED ORDER — INSULIN GLARGINE 100 UNIT/ML ~~LOC~~ SOLN
15.0000 [IU] | Freq: Every day | SUBCUTANEOUS | Status: DC
  Administered 2020-06-13: 15 [IU] via SUBCUTANEOUS
  Filled 2020-06-13 (×2): qty 0.15

## 2020-06-13 MED ORDER — METOPROLOL TARTRATE 5 MG/5ML IV SOLN
2.5000 mg | Freq: Once | INTRAVENOUS | Status: AC
  Administered 2020-06-13: 2.5 mg via INTRAVENOUS
  Filled 2020-06-13: qty 5

## 2020-06-13 MED ORDER — METOPROLOL TARTRATE 25 MG PO TABS
12.5000 mg | ORAL_TABLET | Freq: Two times a day (BID) | ORAL | Status: DC
  Administered 2020-06-13 (×2): 12.5 mg via ORAL
  Filled 2020-06-13 (×2): qty 1

## 2020-06-13 NOTE — Consult Note (Signed)
PHARMACY CONSULT NOTE - FOLLOW UP  Pharmacy Consult for Electrolyte Monitoring and Replacement   Recent Labs: Potassium (mmol/L)  Date Value  06/13/2020 3.8   Magnesium (mg/dL)  Date Value  06/13/2020 2.8 (H)   Calcium (mg/dL)  Date Value  06/13/2020 7.9 (L)   Albumin (g/dL)  Date Value  06/08/2020 2.3 (L)   Phosphorus (mg/dL)  Date Value  06/13/2020 3.1   Sodium (mmol/L)  Date Value  06/13/2020 149 (H)     Assessment: 75 year old male presenting to [facility] ED from home with complaints of progressive shortness of breath over the past 5 days with associated new onset of nonproductive cough, subjective fever/chills.  Patient underwent outpatient testing at [facility] on 06/05/2020 and was found to be positive for COVID-19. Pt is on amiodarone infusion. On free water 200 mL q4H due to elevated sodium.   Goal of Therapy:  K+ > 4  Mg > 2.   Plan:  No replacement at this time. Scr slightly trending up.  F/u with AM labs.   Oswald Hillock ,PharmD Clinical Pharmacist 06/13/2020 8:13 AM

## 2020-06-13 NOTE — Progress Notes (Addendum)
Goals of Care  I had an extensive discussion with patient's two daughter and patient's wife Jermaine Campbell regarding goals of care.  The Clinical status was relayed to family in details including patient's most recent labs, oxygen requirement, imaging, procedures, multidisciplinary notes, physical assessment, plan of care and pertinent events. Patient's family verbalized understanding of clinical progression and prognosis in layman's term.  Patient remains unresponsive and will not open eyes to command.   Upon assessment, there is no significant change from previous assessment. He remains encephalopathic with no spontaneous movements when sedation is lightened.     Patient's family understand that Patient with Progressive multiorgan failure with very low chance of meaningful recovery despite all aggressive and optimal medical therapy.  Family understands the situation and shared that patient would have not wished for any life prolonging measures including CPR, Trach or PEG should his condition persist with no meaningful recovery.  They have consented and agreed to DNR/DNI and would like to proceed with current treatment with plan for one way extubation and comfort care measures.  Family are satisfied with Plan of action and management. All questions answered  Additional CC time 32 mins   Rufina Falco, DNP, FNP-C, AGACNP-BC Acute Care Nurse Practitioner  [facility] Pager: [phone] [name]

## 2020-06-13 NOTE — Progress Notes (Addendum)
Patient's hearing aids were brought to the hospital by his family.  The hearing aids are charging on the overbed table in his room.  [name] gold colored wedding band was removed from his finger and placed in a bag with a face label and placed in the closet with the hearing aid.  Before the patient's daughter Jodie left the hospital this morning she requested the wedding ring be brought from the room to give to her mother.  Her plan is to retrieve it from staff when she returns to the hospital later today.  The bag containing the ring was removed from the closet, cleaned with a sani wipe, and placed inside the patient's chart until Jodie returns.  1545 PM - Patient's gold colored wedding band was given to his daughters by writing RN

## 2020-06-13 NOTE — Progress Notes (Signed)
NAME:  Jermaine Campbell, MRN:  [id number], DOB:  08-May-1945, LOS: 7 ADMISSION DATE:  06/01/2020, CONSULTATION DATE:  06/08/20 REFERRING MD: Jonny Ruiz, NP, CHIEF COMPLAINT: Shortness of breath  History of Present Illness:  75 year old male presenting to [facility] ED from home with complaints of progressive shortness of breath over the past 5 days with associated new onset of nonproductive cough, subjective fever/chills.  Patient underwent outpatient testing at [facility] on 06/05/2020 and was found to be positive for COVID-19.  Due to subsequent further progression of symptoms he arrived at [facility] ED for further evaluation. ED course:  Patient found to be in SVT at 195 receiving 2 doses of IV adenosine which then revealed A. fib RVR.  Dr. Nehemiah Massed with cardiology recommended electrocardioversion which was completed successfully in the ED and ensuing sinus rhythm.  Cardiology additionally recommended the initiation of an amiodarone bolus followed by continuous amiodarone and diltiazem drips.  Initial vitals: T 99, tachypneic at 36, tachycardic 195, BP 158/127 with SPO2 at 88% on room air Significant labs: BUN 48, CRP 29.4, PCT 1.10, D-dimer 2.21, fibrinogen greater than 800.  Chest x-ray showing bilateral airspace opacities greater on the right without consolidation concerning for multifocal atypical pneumonia and mild underlying interstitial edema possible. TRH consulted for admission to PCU.  Hospital course: Patient started on dexamethasone and remdesivir as well as bronchodilators.  Amiodarone and diltiazem drips continued with home Xarelto for atrial fibrillation.  Overnight on 06/07/2020 per nursing patient flipped into A. fib RVR with heart rate in the 170s and associated hypoxia requiring non rebreather.  Subsequently the patient became bradycardic with heart rate in the 20s and when nursing arrived bedside to assess the patient and found him pulseless, unresponsive and apneic.  CPR was initiated and  per nursing patient was defibrillated once before code team arrived, unclear what rhythm might have been at that time.  Upon CODE BLUE team arrival patient was in PEA and being bagged with BVM.  The patient received 3 rounds of epinephrine, 2 A of bicarb with Levophed drip started.  Patient was emergently intubated and ROSC was obtained with post rhythm sinus with sinus arrhythmia.  Patient urgently transferred to ICU with PCCM consulted.  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 06/20/2020-patient admitted to PCU COVID-19 positive with Pneumonia & A. fib RVR requiring amiodarone and diltiazem drips status post cardioversion. . 06/08/20-overnight patient developed A. fib RVR, became hypoxic and ultimately bradycardic before PEA arrest staying requiring emergent intubation and mechanical ventilation with transfer to ICU . 06/09/2020: Currently supine, has tolerated well.  FiO2 requirements down.  Cardiac rhythm still erratic LVEF 40 to 45% down from 2019. . 06/11/2020:  Patient developed cuff leak on existing ET tube inability to ventilate with cuff leak.ETT Exchanged  Interim History / Subjective:  Remains on the vent On precedex  Bp stable not requiring pressors at the moment  Objective   Blood pressure 108/76, pulse 84, temperature 97.7 F (36.5 C), resp. rate (!) 27, height $RemoveBe'5\' 9"'dGRBRLOwh$  (1.753 m), weight 80 kg, SpO2 93 %.    Vent Mode: PRVC FiO2 (%):  [40 %] 40 % Set Rate:  [24 bmp-28  bmp] 24 bmp Vt Set:  [420 mL] 420 mL PEEP:  [8 cmH20] 8 cmH20 Plateau Pressure:  [18 cmH20] 18 cmH20    Intake/Output Summary (Last 24 hours) at 06/13/2020 1021 Last data filed at 06/13/2020 0800 Gross per 24 hour  Intake 3799.16 ml  Output 2140 ml  Net 1659.16 ml   Filed Weights   06/10/20 0500 06/12/20 0326 06/13/20 0330  Weight: 76.6 kg 77.9 kg 80 kg    Examination: Physical examination is limited due to need for PPE/CAPR General: Adult male, critically ill, lying in bed intubated & sedated requiring mechanical ventilation, NAD HEENT: MM pink/moist, anicteric, atraumatic, neck supple Neuro: Intubated and sedated, unable to follow commands, bilateral pupils pinpoint reactive  CV: irregular irregular, no R/G, 2+ right radial 1+ left radial/2+ distal pulses  Pulm: diminished throughout, even, non labored mechanically ventilated  GI: hypoactive BS x4, soft, non distended  GU: foley in place with clear yellow urine Skin: left cheek abrasion  Extremities: warm/dry, no edema present   Labs/imaging that I have personally reviewed  (right click and "Reselect all SmartList Selections" daily)  EKG Interpretation Date:  06/08/2020 EKG Time:  0044 Rate:  100 Rhythm: NSR with PVCs & PACs QRS Axis:  Normal Intervals: Prolonged QTC at 516 ST/T Wave abnormalities: None Narrative Interpretation: Sinus rhythm with PVCs and PACs, prolonged QTC CXR 06/08/2020: Diffuse interstitial opacity, small pleural effusions Labs 06/12/20: Na+ 150, chloride 112, glucose 235, wbc 11.2, hgb 8.3,   Resolved Hospital Problem list     Assessment & Plan:  Acute Hypoxic Respiratory Failure secondary to COVID-19 Bacterial Pneumonia  PMHx:  - Ventilator settings: PRVC  6 mL/kg, FiO2: 100%, PEEP 10, continue lung protective strategies  - Wean PEEP & FiO2 as tolerated to maintain O2 sats >88% - Goal plateau pressure < 30, driving pressure < 15 - Consider continuous paralytics if needed for vent synchrony/gas exchange - Cycle prone positioning if P/F ratio < for oxygenation - Decreasing RASS goal to -1 to -2:  Fentanyl gtt attempting to wean off - Vecuronium IVP Q 1h PRN for ventilator asynchrony - Diuresis as tolerated, goal CVP 5-8.   - Continue VAP prevention protocol - Continue iv steroids wean as tolerated  - Follow inflammatory markers: Ferritin, D-dimer, CRP, LDH - continue Vitamin C & zinc - f/u cultures, monitor WBC/ fever curve, trend PCT - Intermittent CXR & ABG PRN  PEA arrest with shock Chronic Atrial Fibrillation with Rapid Ventricular Response ~Echocardiogram shows LVEF 40 to 45%, global HK, grade 1 DD, biatrial enlargement - Pt with poor rate control amiodarone continued @60  mg/hr for now along with intermittent iv digoxin  - Will check digoxin level in the am  - Continue Xarelto - Cardiology consulted, appreciate input - Continuous cardiac monitoring  Hypernatremia  -Trend BMP  -Will increase free water flushes to 200 ml q4hrs   Steroid-induced hyperglycemia in the setting of T2DM -CBG monitor every 4hrs  -SSI -Lantus 10 units daily -Follow ICU hypo-/hyperglycemia protocol  Hypothyroidism -Continue Synthroid  Nutrition -Continue TF's   Acute encephalopathy possibly secondary to acute ischemia, postictal phenomenon or anoxic brain injury ~MRI Brain 06/11/20 revealed small area of abnormal diffusion restriction in the medial left temporal lobe. This could indicate a focus of acute ischemia, postictal phenomenon or be secondary to anoxic brain injury. Bilateral frontal encephalomalacia, right greater than left, likely a sequela of remote trauma. -Wean sedation off as tolerated  -Frequent neuro checks -Neurology consulted appreciate input  -EEG results pending  Best practice (right click and "Reselect all SmartList Selections" daily)  Diet:  NPO Pain/Anxiety/Delirium protocol (if indicated): Yes (RASS goal -2) VAP protocol (if indicated): Yes DVT prophylaxis: Systemic AC GI prophylaxis: PPI Glucose control:  SSI Yes Central venous access:  Yes, and it is  still needed Arterial line:  Yes, and it is still needed Foley:  Yes, and it is still needed Mobility:  bed rest  PT consulted: N/A Last date of multidisciplinary goals of care discussion 06/07/2020 Code Status:  full code Disposition: ICU  Labs   CBC: Recent Labs  Lab 06/08/20 0251 06/09/20 0628 06/10/20 0458 06/11/20 0419 06/12/20 0518 06/13/20 0309  WBC 11.2* 10.3 5.9 7.2 11.2* 11.0*  NEUTROABS 10.3*  --  5.3 6.9 10.7* 10.5*  HGB 9.1* 8.5* 8.2* 8.3* 8.3* 7.7*  HCT 29.9* 27.9* 25.9* 27.4* 26.6* 24.7*  MCV 90.3 88.6 86.6 90.1 87.8 87.3  PLT 192 172 122* 149* 168 810    Basic Metabolic Panel: Recent Labs  Lab 06/10/20 0458 06/10/20 1705 06/11/20 0419 06/12/20 0518 06/13/20 0309  NA 148* 147* 148* 150* 149*  K 3.5 3.8 4.5 3.6 3.8  CL 110 109 110 112* 111  CO2 $Re'29 27 29 30 31  'ouw$ GLUCOSE 158* 215* 247* 235* 261*  BUN 62* 68* 76* 69* 76*  CREATININE 1.16 1.21 1.23 1.01 1.06  CALCIUM 7.9* 8.0* 7.9* 8.3* 7.9*  MG 2.5* 2.5* 2.7* 2.7* 2.8*  PHOS 2.7 3.4 5.0* 2.6 3.1   GFR: Estimated Creatinine Clearance: 58.4 mL/min (by C-G formula based on SCr of 1.06 mg/dL). Recent Labs  Lab 06/08/20 0251 06/08/20 0551 06/08/20 2140 06/09/20 0628 06/10/20 0458 06/11/20 0419 06/12/20 0518 06/13/20 0309  PROCALCITON 0.80  --   --    < > 1.33 1.02 0.75 0.53  WBC 11.2*  --   --    < > 5.9 7.2 11.2* 11.0*  LATICACIDVEN 3.8* 2.5* 1.2  --   --   --   --   --    < > = values in this interval not displayed.    Liver Function Tests: Recent Labs  Lab 06/07/20 0423 06/08/20 0251  AST 48*  41 204*  ALT 21  18 178*  ALKPHOS 58  51 58  BILITOT 0.5  0.5 0.5  PROT 7.8  6.7 6.3*  ALBUMIN 3.3*  2.6* 2.3*   No results for input(s): LIPASE, AMYLASE in the last 168 hours. No results for input(s): AMMONIA in the last 168 hours.  ABG    Component Value Date/Time   PHART 7.50 (H) 06/12/2020 1214   PCO2ART 44 06/12/2020 1214   PO2ART 64 (L) 06/12/2020 1214   HCO3 34.3 (H)  06/12/2020 1214   ACIDBASEDEF 0.2 06/08/2020 0551   O2SAT 94.0 06/12/2020 1214     Coagulation Profile: Recent Labs  Lab 06/08/20 0251  INR 1.2    Cardiac Enzymes: No results for input(s): CKTOTAL, CKMB, CKMBINDEX, TROPONINI in the last 168 hours.  HbA1C: Hgb A1c MFr Bld  Date/Time Value Ref Range Status  06/07/2020 04:23 AM 6.3 (H) 4.8 - 5.6 % Final    Comment:    (NOTE) Pre diabetes:          5.7%-6.4%  Diabetes:              >6.4%  Glycemic control for   <7.0% adults with diabetes     CBG: Recent Labs  Lab 06/12/20 1549 06/12/20 1932 06/12/20 2322 06/13/20 0304 06/13/20 0731  GLUCAP 226* 237*  237* 243* 220*    Review of Systems:   Patient intubated and sedated unable to participate    Allergies Allergies  Allergen Reactions  . Tamsulosin Hcl Other (See Comments)    Makes blood pressure drop low and pass out     Scheduled Meds: . vitamin C  500 mg Per Tube Daily  . chlorhexidine gluconate (MEDLINE KIT)  15 mL Mouth Rinse BID  . Chlorhexidine Gluconate Cloth  6 each Topical Daily  . dexamethasone (DECADRON) injection  6 mg Intravenous Q24H  . docusate  100 mg Per Tube BID  . feeding supplement (PROSource TF)  45 mL Per Tube QID  . free water  200 mL Per Tube Q4H  . insulin aspart  0-20 Units Subcutaneous Q4H  . insulin aspart  3 Units Subcutaneous Q4H  . insulin glargine  15 Units Subcutaneous Daily  . levothyroxine  125 mcg Per Tube Q0600  . mouth rinse  15 mL Mouth Rinse 10 times per day  . metoprolol tartrate  12.5 mg Oral BID  . pantoprazole (PROTONIX) IV  40 mg Intravenous Q24H  . polyethylene glycol  17 g Per Tube Daily  . rivaroxaban  20 mg Per Tube Daily  . simvastatin  40 mg Per Tube Daily  . zinc sulfate  220 mg Per Tube Daily   Continuous Infusions: . amiodarone 60 mg/hr (06/13/20 0851)  . dexmedetomidine (PRECEDEX) IV infusion 1.2 mcg/kg/hr (06/13/20 0851)  . feeding supplement (VITAL AF 1.2 CAL) 1,000 mL (06/12/20 1347)  .  fentaNYL infusion INTRAVENOUS 75 mcg/hr (06/13/20 0800)  . midazolam Stopped (06/11/20 1821)  . propofol (DIPRIVAN) infusion Stopped (06/10/20 1938)   PRN Meds:.acetaminophen **OR** acetaminophen, albuterol, fentaNYL, fentaNYL (SUBLIMAZE) injection, midazolam, vecuronium   Critical care time: [place], DNP, FNP-C, AGACNP-BC Acute Care Nurse Practitioner  [facility] Pulmonary &Critical Care Medicine Pager: [phone] [place] at [facility]

## 2020-06-13 NOTE — Progress Notes (Addendum)
Sedation < 50% from 1525-1700 this shift.  Pt's VS were improved from last WUA and remained acceptable.  However, although he was only breathing about 4 TPM over the vent, his WOB increased exponentially with ABD retractions and he was visibly diaphoretic.  The vent remained in [facility] mode as he did not appear appropriate for PS mode.  Full sedation restarted with fent and precedex During sedation vacation pt was not purposeful, minimal lip movement during oral care, he did appear to have minimal random eyelid fluttering.  No movement to noxious stimuli or loud calling of his name. DNR band placed on patient per order

## 2020-06-14 ENCOUNTER — Inpatient Hospital Stay: Payer: Medicare Other

## 2020-06-14 LAB — GLUCOSE, CAPILLARY
Glucose-Capillary: 149 mg/dL — ABNORMAL HIGH (ref 70–99)
Glucose-Capillary: 165 mg/dL — ABNORMAL HIGH (ref 70–99)
Glucose-Capillary: 165 mg/dL — ABNORMAL HIGH (ref 70–99)

## 2020-06-14 LAB — CBC WITH DIFFERENTIAL/PLATELET
Abs Immature Granulocytes: 0.1 10*3/uL — ABNORMAL HIGH (ref 0.00–0.07)
Basophils Absolute: 0 10*3/uL (ref 0.0–0.1)
Basophils Relative: 0 %
Eosinophils Absolute: 0 10*3/uL (ref 0.0–0.5)
Eosinophils Relative: 0 %
HCT: 25.9 % — ABNORMAL LOW (ref 39.0–52.0)
Hemoglobin: 8.2 g/dL — ABNORMAL LOW (ref 13.0–17.0)
Immature Granulocytes: 1 %
Lymphocytes Relative: 2 %
Lymphs Abs: 0.2 10*3/uL — ABNORMAL LOW (ref 0.7–4.0)
MCH: 27.3 pg (ref 26.0–34.0)
MCHC: 31.7 g/dL (ref 30.0–36.0)
MCV: 86.3 fL (ref 80.0–100.0)
Monocytes Absolute: 0.4 10*3/uL (ref 0.1–1.0)
Monocytes Relative: 3 %
Neutro Abs: 11.3 10*3/uL — ABNORMAL HIGH (ref 1.7–7.7)
Neutrophils Relative %: 94 %
Platelets: 170 10*3/uL (ref 150–400)
RBC: 3 MIL/uL — ABNORMAL LOW (ref 4.22–5.81)
RDW: 19.9 % — ABNORMAL HIGH (ref 11.5–15.5)
Smear Review: NORMAL
WBC: 12.1 10*3/uL — ABNORMAL HIGH (ref 4.0–10.5)
nRBC: 0.9 % — ABNORMAL HIGH (ref 0.0–0.2)

## 2020-06-14 LAB — C-REACTIVE PROTEIN: CRP: 17.8 mg/dL — ABNORMAL HIGH (ref <1.0)

## 2020-06-14 LAB — BLOOD GAS, ARTERIAL
Acid-Base Excess: 4.9 mmol/L — ABNORMAL HIGH (ref 0.0–2.0)
Bicarbonate: 33 mmol/L — ABNORMAL HIGH (ref 20.0–28.0)
FIO2: 75
MECHVT: 420 mL
Mechanical Rate: 24
O2 Saturation: 93.4 %
PEEP: 10 cmH2O
Patient temperature: 37
RATE: 24 resp/min
pCO2 arterial: 64 mmHg — ABNORMAL HIGH (ref 32.0–48.0)
pH, Arterial: 7.32 — ABNORMAL LOW (ref 7.350–7.450)
pO2, Arterial: 74 mmHg — ABNORMAL LOW (ref 83.0–108.0)

## 2020-06-14 LAB — BASIC METABOLIC PANEL
Anion gap: 8 (ref 5–15)
BUN: 70 mg/dL — ABNORMAL HIGH (ref 8–23)
CO2: 30 mmol/L (ref 22–32)
Calcium: 7.7 mg/dL — ABNORMAL LOW (ref 8.9–10.3)
Chloride: 112 mmol/L — ABNORMAL HIGH (ref 98–111)
Creatinine, Ser: 0.92 mg/dL (ref 0.61–1.24)
GFR, Estimated: >60 mL/min (ref >60)
Glucose, Bld: 205 mg/dL — ABNORMAL HIGH (ref 70–99)
Potassium: 4.1 mmol/L (ref 3.5–5.1)
Sodium: 150 mmol/L — ABNORMAL HIGH (ref 135–145)

## 2020-06-14 LAB — MAGNESIUM: Magnesium: 2.6 mg/dL — ABNORMAL HIGH (ref 1.7–2.4)

## 2020-06-14 LAB — METHYLMALONIC ACID, SERUM: Methylmalonic Acid, Quantitative: 152 nmol/L (ref 0–378)

## 2020-06-14 LAB — PHOSPHORUS: Phosphorus: 3.4 mg/dL (ref 2.5–4.6)

## 2020-06-14 MED ORDER — HALOPERIDOL 0.5 MG PO TABS
0.5000 mg | ORAL_TABLET | ORAL | Status: DC | PRN
Start: 2020-06-14 — End: 2020-06-14
  Filled 2020-06-14: qty 1

## 2020-06-14 MED ORDER — BIOTENE DRY MOUTH MT LIQD: 15.0000 mL | OROMUCOSAL | Status: DC | PRN

## 2020-06-14 MED ORDER — GLYCOPYRROLATE 0.2 MG/ML IJ SOLN
0.2000 mg | INTRAMUSCULAR | Status: DC | PRN
Start: 2020-06-14 — End: 2020-06-14

## 2020-06-14 MED ORDER — LORAZEPAM 1 MG PO TABS: 1.0000 mg | ORAL_TABLET | ORAL | Status: DC | PRN

## 2020-06-14 MED ORDER — HALOPERIDOL LACTATE 5 MG/ML IJ SOLN: 0.5000 mg | INTRAMUSCULAR | Status: DC | PRN

## 2020-06-14 MED ORDER — ONDANSETRON 4 MG PO TBDP
4.0000 mg | ORAL_TABLET | Freq: Four times a day (QID) | ORAL | Status: DC | PRN
  Filled 2020-06-14: qty 1

## 2020-06-14 MED ORDER — LORAZEPAM 2 MG/ML IJ SOLN: 1.0000 mg | INTRAMUSCULAR | Status: DC | PRN

## 2020-06-14 MED ORDER — LORAZEPAM 2 MG/ML PO CONC: 1.0000 mg | ORAL | Status: DC | PRN

## 2020-06-14 MED ORDER — HALOPERIDOL LACTATE 2 MG/ML PO CONC
0.5000 mg | ORAL | Status: DC | PRN
Start: 2020-06-14 — End: 2020-06-14
  Filled 2020-06-14: qty 0.3

## 2020-06-14 MED ORDER — GLYCOPYRROLATE 0.2 MG/ML IJ SOLN: 0.2000 mg | INTRAMUSCULAR | Status: DC | PRN

## 2020-06-14 MED ORDER — ONDANSETRON HCL 4 MG/2ML IJ SOLN: 4.0000 mg | Freq: Four times a day (QID) | INTRAMUSCULAR | Status: DC | PRN

## 2020-06-14 MED ORDER — POLYVINYL ALCOHOL 1.4 % OP SOLN
1.0000 [drp] | Freq: Four times a day (QID) | OPHTHALMIC | Status: DC | PRN
  Filled 2020-06-14: qty 15

## 2020-06-14 MED ORDER — MORPHINE 100MG IN NS 100ML (1MG/ML) PREMIX INFUSION
1.0000 mg/h | INTRAVENOUS | Status: DC
  Administered 2020-06-14: 1 mg/h via INTRAVENOUS
  Filled 2020-06-14: qty 100

## 2020-06-14 MED ORDER — GLYCOPYRROLATE 1 MG PO TABS
1.0000 mg | ORAL_TABLET | ORAL | Status: DC | PRN
  Filled 2020-06-14: qty 1

## 2020-06-24 NOTE — Progress Notes (Signed)
Patient will be transitioned to comfort care per the request of the family.  He has deteriorated with increased O2 requirements.  Worsening radiographic picture.  Agonal breathing on vent.  Transition to comfort care per the family request.  Awaiting family to come at the bedside.  Renold Don, MD [place] [facility]

## 2020-06-24 NOTE — Progress Notes (Signed)
Patient expired peacefully at 0955 hrs with family at bedside.. Pronounced as deceased by Rufina Falco, NP.

## 2020-06-24 NOTE — TOC Progression Note (Signed)
Transition of Care [facility]) - Progression Note    Patient Details  Name: Jermaine Campbell MRN: [id number] Date of Birth: 08-31-1945  Transition of Care [facility]) CM/SW [place], [place] Phone Number: [phone] 07/06/2020, 9:59 AM  Clinical Narrative:     Family request for transition to comfort care, extubated, family at bedside.  Expected Discharge Plan: [place] Barriers to Discharge: Continued Medical Work up  Expected Discharge Plan and Services Expected Discharge Plan: [place] In-house Referral: Clinical Social Work     Living arrangements for the past 2 months: Single Family Home                                       Social Determinants of Health (SDOH) Interventions    Readmission Risk Interventions No flowsheet data found.

## 2020-06-24 NOTE — Death Summary Note (Signed)
DEATH SUMMARY   Patient Details  Name: Jermaine Campbell MRN: [id number] DOB: 05-29-1945  Admission/Discharge Information   Admit Date:  06/17/20  Date of Death: Date of Death: 06-25-20  Time of Death: Time of Death: 0955  Length of Stay: 8  Referring Physician: Marinda Elk, MD   Reason(s) for Hospitalization  COVID Pneumonia, Afib with RVR  Diagnoses  Preliminary cause of death:  Secondary Diagnoses (including complications and co-morbidities):  Principal Problem:   Pneumonia due to COVID-19 virus Active Problems:   Hyperlipidemia, mixed   Acquired hypothyroidism   Pharyngeal dysphagia   Atrial fibrillation with RVR ([place])   Shortness of breath   Generalized weakness   AKI (acute kidney injury) ([place])   Malnutrition of moderate degree   Pressure injury of skin   Seizures [facility])   Brief Hospital Course (including significant findings, care, treatment, and services provided and events leading to death)  Jermaine Campbell is a 75 y.o. year old male who presented to the ED on 06/18/22 with c/o of progressive SOB associated with new onset of nonproductive cough, fevers and chills. .  ED Course: On arrival to the ED, he was afebrile with blood systolic blood pressure in the180s-190s mm Hg and pulse rate 81 beats/min. Initial EKG showed SVT with heart rate 195, with post electrocardioversion EKG showing sinus tachycardia with PVC and heart rate 115, with normal intervals, and no evidence of T wave or ST changes. He received 2 doses of Adenosine with no improvement therefore the EDP to consult the on-call cardiologist,  who recommended electrocardioversion with restoration of NSR. Patient was started on Amiodarone gtt for rate control. Initial labs revealed elevated white count, AKI and COVID positive. Patient was started on Remdesivir and admitted to PCU for further evaluation management of acute hypoxic respiratory distress in the setting of severe COVID-19 pneumonia and  complicated by initial atrial fibrillation with RVR.  Hospital Course: During the course of his admission, patient went into Afib with RVR again on the night of 06/07/2020 with HR in the 170's with associated hypoxia requiring nonrebreathing. Subsequently the patient became bradycardic with heart rate in the 20s and when nursing arrived bedside to assess the patient and found him pulseless, unresponsive and apneic.  CPR was initiated and per nursing patient was defibrillated once before code team arrived, unclear what rhythm might have been at that time.  Upon CODE BLUE team arrival patient was in PEA and being bagged with BVM.  The patient received 3 rounds of epinephrine, 2 A of bicarb with Levophed drip started.  Patient was emergently intubated and ROSC was obtained with post rhythm sinus with sinus arrhythmia.  Patient urgently transferred to ICU with PCCM consulted.   ICU Course: Patient remained on the vent on sedation and pressors. He developed cuff leak on existing ET tube inability to ventilate with cuff leak.ETT Exchanged. He remained in atrial fibrillation with variable heart rate and hypotension.Echocardiogram from showing mild global LV systolic dysfunction most consistent with atrial fibrillation rather than acute congestive heart failure with ejection fraction of 40 to 45%. Cardiology was consulted who recommended continuing with Amiodarone gtt, heparin gtt and no further cardiac diagnostics. Due to persistent encephalopathy, MRI was obtained which showed a patch of slightly hyperintense DWI signal in the posterior body of the left hippocampus. Neurology was consulted and EEG obtained showing continuous generalized low amplitude 2 to 3 Hz delta slowing.Pt remained vent dependent and unable to wean. He had an episode of hypertension  sbp via aline 180's-190's and via NIVBP 140's requiring 10 mg iv hydralazine x1 dose with improvement.  Attempted to keep sedation off overnight, however hr increased  to the 150's, sbp increased to 180's, and pt started stacking breaths on the ventilator.  Pt noted to have eye fluttering to deep suction, gag reflex, and started biting on ETT. On 5/21, goals of care was again discussed with patient's family who consented and agreed to DNR/DNI and proceed with current treatment with plan for one way extubation and comfort care measures if his condition continues to deteriorate. On the morning of 5/22, patient was noted to have increased work of breathing on the vent, he was hypoxic,  tachycardic, hypotensive and febrile. Chest xray was obtained which showed new RLL atelectasis. Due to continued progressive decline in patient's clinical status, family was contacted and they agreed to proceed with extubation and transition to comfort care. Patient was terminally extubated at [phone] and passed away and 9:55am with family and Chaplain at the bedside.  Pertinent Labs and Studies  Significant Diagnostic Studies DG Chest 1 View  Result Date: 22-Jun-2020 CLINICAL DATA:  Acute on chronic respiratory failure. EXAM: CHEST  1 VIEW COMPARISON:  06/10/2020 FINDINGS: Cardiomegaly, CABG changes, and endotracheal tube with tip 5 cm above the carina again noted. An esophageal probe is now noted. Increased RIGHT LOWER lobe atelectasis/collapse noted. Pulmonary vascular congestion is present. No pneumothorax identified. Mild LEFT basilar atelectasis again identified. No pneumothorax or large pleural effusion noted. IMPRESSION: Increased RIGHT LOWER lobe atelectasis/collapse. Electronically Signed   By: Margarette Canada M.D.   On: 2020-06-22 08:12   DG Chest 1 View  Result Date: 06/10/2020 CLINICAL DATA:  Check endotracheal tube EXAM: CHEST  1 VIEW COMPARISON:  06/08/2020 FINDINGS: Endotracheal tube is again seen in satisfactory position 4.4 cm above the carina. Aortic calcifications are noted. Postsurgical changes are seen. Patchy airspace opacity is again identified bilaterally consistent with  edema. No new focal abnormality is seen. IMPRESSION: Endotracheal tube in satisfactory position. The remainder of the exam is stable. Electronically Signed   By: Inez Catalina M.D.   On: 06/10/2020 21:57   MR BRAIN WO CONTRAST  Result Date: 06/11/2020 CLINICAL DATA:  Anoxic brain injury. EXAM: MRI HEAD WITHOUT CONTRAST TECHNIQUE: Multiplanar, multiecho pulse sequences of the brain and surrounding structures were obtained without intravenous contrast. COMPARISON:  None. FINDINGS: Brain: There is abnormal diffusion restriction within the medial left temporal lobe (5:22). No acute or chronic hemorrhage. There is encephalomalacia of both frontal lobes, right greater than left. There is multifocal periventricular white matter hyperintensity, most often a result of chronic microvascular ischemia. The midline structures are normal. Vascular: Major flow voids are preserved. Skull and upper cervical spine: Normal calvarium and skull base. Visualized upper cervical spine and soft tissues are normal. Sinuses/Orbits:No paranasal sinus fluid levels or advanced mucosal thickening. No mastoid or middle ear effusion. Normal orbits. IMPRESSION: 1. Small area of abnormal diffusion restriction in the medial left temporal lobe. This could indicate a focus of acute ischemia, postictal phenomenon or be secondary to anoxic brain injury. 2. Bilateral frontal encephalomalacia, right greater than left, likely a sequela of remote trauma. Electronically Signed   By: Ulyses Jarred M.D.   On: 06/11/2020 02:28   DG ABDOMEN PEG TUBE LOCATION  Result Date: 05/15/2020 CLINICAL DATA:  Status post PEG tube placement EXAM: ABDOMEN - 1 VIEW COMPARISON:  10/16/2019 FINDINGS: Supine portable view of the abdomen. Gastrostomy tube balloon projects over the body of the  stomach. Contrast opacifies the stomach and duodenum. No gross extravasation. IMPRESSION: Gastrostomy tube overlies the body of the stomach. No gross extravasation. Electronically  Signed   By: Donavan Foil M.D.   On: 05/15/2020 20:28   DG Chest Port 1 View  Result Date: 06/08/2020 CLINICAL DATA:  Check endotracheal tube placement EXAM: PORTABLE CHEST 1 VIEW COMPARISON:  04/08/2020 FINDINGS: Cardiac shadow is stable. Aortic calcifications are again seen. Endotracheal tube is again noted and stable. Postsurgical change diffuse bilateral airspace opacity consistent with congestive failure and edema. No pneumothorax is noted. IMPRESSION: Chronic changes of CHF. Endotracheal tube in noted in satisfactory position. Electronically Signed   By: Inez Catalina M.D.   On: 06/08/2020 22:18   DG Chest Port 1 View  Result Date: 06/08/2020 CLINICAL DATA:  Intubation. EXAM: PORTABLE CHEST 1 VIEW COMPARISON:  Two days ago FINDINGS: Endotracheal tube with tip below the clavicular heads. Diffuse interstitial opacity with cephalized blood flow and Kerley lines. Small pleural effusions. Mild cardiomegaly. CABG. IMPRESSION: 1. Endotracheal tube in unremarkable position. 2. Extensive and symmetric opacity favoring CHF Electronically Signed   By: Monte Fantasia M.D.   On: 06/08/2020 04:04   DG Chest Portable 1 View  Result Date: 05/30/2020 CLINICAL DATA:  Shortness of breath.  COVID-19 positive EXAM: PORTABLE CHEST 1 VIEW COMPARISON:  February 13, 2007 FINDINGS: There is ill-defined airspace opacity in the right mid lung and both lung bases. Equivocal increased opacity left mid lung. Heart is upper normal in size with pulmonary vascularity normal. Patient is status post coronary artery bypass grafting. No adenopathy. There is aortic atherosclerosis. No bone lesions IMPRESSION: Ill-defined airspace opacity, more on the right than on the left without consolidation. Suspect multifocal pneumonia, likely of atypical organism etiology. A degree of mild underlying interstitial edema is possible. Status post coronary artery bypass grafting. Heart upper normal in size. Aortic Atherosclerosis (ICD10-I70.0).  Electronically Signed   By: Lowella Grip III M.D.   On: 05/29/2020 21:52   EEG adult  Result Date: 06/11/2020 Lora Havens, MD     06/12/2020  8:49 AM Patient Name: Jermaine Campbell MRN: [id number] Epilepsy Attending: Lora Havens Referring Physician/Provider: Marda Stalker, NP Date: 06/11/2020 Duration:  28.30 mins Patient history: 75 year old male initially who initially presented to the hospital with Covid-pneumonia and then had an episode of PEA arrest on the 16th.  EEG to evaluate for seizures. Level of alertness: comatose AEDs during EEG study: None Technical aspects: This EEG study was done with scalp electrodes positioned according to the 10-20 International system of electrode placement. Electrical activity was acquired at a sampling rate of 500Hz  and reviewed with a high frequency filter of 70Hz  and a low frequency filter of 1Hz . EEG data were recorded continuously and digitally stored. Description: EEG showed continuous generalized low amplitude 2 to 3 Hz delta slowing.  EEG was reactive to noxious stimulation.  Hyperventilation and photic stimulation were not performed.   ABNORMALITY - Continuous slow, generalized IMPRESSION: This study is suggestive of severe diffuse encephalopathy, nonspecific etiology. No seizures or epileptiform discharges were seen throughout the recording. Lora Havens   ECHOCARDIOGRAM COMPLETE  Result Date: 06/09/2020    ECHOCARDIOGRAM REPORT   Patient Name:   Lawarence A Curley Date of Exam: 06/09/2020 Medical Rec #:  [id number]        Height:       69.0 in Accession #:    [id number]       Weight:  169.5 lb Date of Birth:  February 08, 1945        BSA:          1.926 m Patient Age:    75 years         BP:           110/58 mmHg Patient Gender: M                HR:           103 bpm. Exam Location:  [facility] Procedure: 2D Echo, Color Doppler and Cardiac Doppler Indications:     Cardiac arrest I45.9  History:         Patient has no prior history of Echocardiogram  examinations.                  Previous Myocardial Infarction; Risk Factors:Hypertension.  Sonographer:     Sherrie Sport RDCS (AE) Referring Phys:  [id number] [place] Diagnosing Phys: Bartholome Bill MD  Sonographer Comments: Echo performed with patient supine and on artificial respirator. IMPRESSIONS  1. Left ventricular ejection fraction, by estimation, is 40 to 45%. The left ventricle has mildly decreased function. The left ventricle demonstrates global hypokinesis. There is moderate left ventricular hypertrophy. Left ventricular diastolic parameters are consistent with Grade I diastolic dysfunction (impaired relaxation).  2. Right ventricular systolic function is normal. The right ventricular size is mildly enlarged.  3. Left atrial size was mildly dilated.  4. Right atrial size was mildly dilated.  5. The mitral valve is grossly normal. Trivial mitral valve regurgitation.  6. The aortic valve is calcified. Aortic valve regurgitation is trivial. Mild to moderate aortic valve sclerosis/calcification is present, without any evidence of aortic stenosis. FINDINGS  Left Ventricle: Left ventricular ejection fraction, by estimation, is 40 to 45%. The left ventricle has mildly decreased function. The left ventricle demonstrates global hypokinesis. The left ventricular internal cavity size was normal in size. There is  moderate left ventricular hypertrophy. Left ventricular diastolic parameters are consistent with Grade I diastolic dysfunction (impaired relaxation). Right Ventricle: The right ventricular size is mildly enlarged. No increase in right ventricular wall thickness. Right ventricular systolic function is normal. Left Atrium: Left atrial size was mildly dilated. Right Atrium: Right atrial size was mildly dilated. Pericardium: There is no evidence of pericardial effusion. Mitral Valve: The mitral valve is grossly normal. Trivial mitral valve regurgitation. Tricuspid Valve: The tricuspid valve is not well  visualized. Tricuspid valve regurgitation is trivial. Aortic Valve: The aortic valve is calcified. Aortic valve regurgitation is trivial. Mild to moderate aortic valve sclerosis/calcification is present, without any evidence of aortic stenosis. Aortic valve mean gradient measures 11.7 mmHg. Aortic valve peak gradient measures 19.2 mmHg. Aortic valve area, by VTI measures 1.42 cm. Pulmonic Valve: The pulmonic valve was not assessed. Pulmonic valve regurgitation is not visualized. Aorta: The aortic root is normal in size and structure. IAS/Shunts: The interatrial septum was not assessed.  LEFT VENTRICLE PLAX 2D LVIDd:         4.09 cm LVIDs:         3.47 cm LV PW:         1.40 cm LV IVS:        1.74 cm LVOT diam:     2.15 cm LV SV:         45 LV SV Index:   24 LVOT Area:     3.63 cm  LEFT ATRIUM  Index       RIGHT ATRIUM           Index LA diam:        4.00 cm 2.08 cm/m  RA Area:     19.80 cm LA Vol (A2C):   46.8 ml 24.30 ml/m RA Volume:   55.50 ml  28.82 ml/m LA Vol (A4C):   50.8 ml 26.38 ml/m LA Biplane Vol: 52.3 ml 27.16 ml/m  AORTIC VALVE                    PULMONIC VALVE AV Area (Vmax):    1.27 cm     PV Vmax:        0.60 m/s AV Area (Vmean):   1.26 cm     PV Peak grad:   1.4 mmHg AV Area (VTI):     1.42 cm     RVOT Peak grad: 2 mmHg AV Vmax:           219.00 cm/s AV Vmean:          152.000 cm/s AV VTI:            0.320 m AV Peak Grad:      19.2 mmHg AV Mean Grad:      11.7 mmHg LVOT Vmax:         76.40 cm/s LVOT Vmean:        52.700 cm/s LVOT VTI:          0.125 m LVOT/AV VTI ratio: 0.39  AORTA Ao Root diam: 3.40 cm MITRAL VALVE               TRICUSPID VALVE MV Area (PHT): 6.37 cm    TR Peak grad:   9.2 mmHg MV Decel Time: 119 msec    TR Vmax:        152.00 cm/s MV E velocity: 97.30 cm/s                            SHUNTS                            Systemic VTI:  0.12 m                            Systemic Diam: 2.15 cm Bartholome Bill MD Electronically signed by Bartholome Bill MD Signature  Date/Time: 06/09/2020/12:10:29 PM    Final     Microbiology Recent Results (from the past 240 hour(s))  MRSA PCR Screening     Status: None   Collection Time: 06/08/20  1:17 AM   Specimen: Nasal Mucosa; Nasopharyngeal  Result Value Ref Range Status   MRSA by PCR NEGATIVE NEGATIVE Final    Comment:        The GeneXpert MRSA Assay (FDA approved for NASAL specimens only), is one component of a comprehensive MRSA colonization surveillance program. It is not intended to diagnose MRSA infection nor to guide or monitor treatment for MRSA infections. Performed at [facility], [address] [place],  [address]     Lab Basic Metabolic Panel: Recent Labs  Lab 06/10/20 1705 06/11/20 0419 06/12/20 0518 06/13/20 0309 2020/07/13 0338  NA 147* 148* 150* 149* 150*  K 3.8 4.5 3.6 3.8 4.1  CL 109 110 112* 111 112*  CO2 27 29 30 31 30   GLUCOSE 215* 247* 235* 261*  205*  BUN 68* 76* 69* 76* 70*  CREATININE 1.21 1.23 1.01 1.06 0.92  CALCIUM 8.0* 7.9* 8.3* 7.9* 7.7*  MG 2.5* 2.7* 2.7* 2.8* 2.6*  PHOS 3.4 5.0* 2.6 3.1 3.4   Liver Function Tests: Recent Labs  Lab 06/08/20 0251  AST 204*  ALT 178*  ALKPHOS 58  BILITOT 0.5  PROT 6.3*  ALBUMIN 2.3*   No results for input(s): LIPASE, AMYLASE in the last 168 hours. No results for input(s): AMMONIA in the last 168 hours. CBC: Recent Labs  Lab 06/10/20 0458 06/11/20 0419 06/12/20 0518 06/13/20 0309 Jul 03, 2020 0338  WBC 5.9 7.2 11.2* 11.0* 12.1*  NEUTROABS 5.3 6.9 10.7* 10.5* 11.3*  HGB 8.2* 8.3* 8.3* 7.7* 8.2*  HCT 25.9* 27.4* 26.6* 24.7* 25.9*  MCV 86.6 90.1 87.8 87.3 86.3  PLT 122* 149* 168 161 170   Cardiac Enzymes: No results for input(s): CKTOTAL, CKMB, CKMBINDEX, TROPONINI in the last 168 hours. Sepsis Labs: Recent Labs  Lab 06/08/20 0251 06/08/20 0551 06/08/20 2140 06/09/20 [id number] 06/10/20 0458 06/11/20 0419 06/12/20 0518 06/13/20 0309 2020/07/03 0338  PROCALCITON 0.80  --   --    < > 1.33 1.02  0.75 0.53  --   WBC 11.2*  --   --    < > 5.9 7.2 11.2* 11.0* 12.1*  LATICACIDVEN 3.8* 2.5* 1.2  --   --   --   --   --   --    < > = values in this interval not displayed.    Procedures/Operations       Rufina Falco, DNP, CCRN, FNP-C, AGACNP-BC Acute Care Nurse Practitioner  [facility] Pager: [phone] [place] at [facility]

## 2020-06-24 NOTE — Plan of Care (Signed)
Worsening hypoxia this AM; CCM is working-up. Family coming to bedside later today. Remains intubated and sedated, on vasopressors.    Problem: Education: Goal: Knowledge of risk factors and measures for prevention of condition will improve Outcome: Not Progressing   Problem: Respiratory: Goal: Complications related to the disease process, condition or treatment will be avoided or minimized Outcome: Not Progressing   Problem: Health Behavior/Discharge Planning: Goal: Ability to manage health-related needs will improve Outcome: Not Progressing   Problem: Clinical Measurements: Goal: Ability to maintain clinical measurements within normal limits will improve Outcome: Not Progressing Goal: Will remain free from infection Outcome: Not Progressing Goal: Diagnostic test results will improve Outcome: Not Progressing Goal: Respiratory complications will improve Outcome: Not Progressing Goal: Cardiovascular complication will be avoided Outcome: Not Progressing   Problem: Activity: Goal: Risk for activity intolerance will decrease Outcome: Not Progressing   Problem: Coping: Goal: Level of anxiety will decrease Outcome: Not Progressing   Problem: Elimination: Goal: Will not experience complications related to bowel motility Outcome: Not Progressing Goal: Will not experience complications related to urinary retention Outcome: Not Progressing   Problem: Pain Managment: Goal: General experience of comfort will improve Outcome: Not Progressing

## 2020-06-24 NOTE — Consult Note (Signed)
PHARMACY CONSULT NOTE - FOLLOW UP  Pharmacy Consult for Electrolyte Monitoring and Replacement   Recent Labs: Potassium (mmol/L)  Date Value  05/28/2020 4.1   Magnesium (mg/dL)  Date Value  06/13/2020 2.6 (H)   Calcium (mg/dL)  Date Value  05/25/2020 7.7 (L)   Albumin (g/dL)  Date Value  06/08/2020 2.3 (L)   Phosphorus (mg/dL)  Date Value  06/20/2020 3.4   Sodium (mmol/L)  Date Value  05/30/2020 150 (H)     Assessment: 75 year old male presenting to [facility] ED from home with complaints of progressive shortness of breath over the past 5 days with associated new onset of nonproductive cough, subjective fever/chills.  Patient underwent outpatient testing at [facility] on 06/05/2020 and was found to be positive for COVID-19. Pt is on amiodarone infusion. On free water 200 mL q4H due to elevated sodium.   Goal of Therapy:  K+ > 4  Mg > 2.   Plan:  No replacement at this time. Scr slightly trending up.  F/u with AM labs.   Jermaine Campbell ,PharmD Clinical Pharmacist 05/30/2020 7:50 AM

## 2020-06-24 NOTE — Progress Notes (Signed)
Pt remains sedated and inbubated. Sedation and amio infusing. O2 increased to 45%. No purposeful movement.

## 2020-06-24 NOTE — Progress Notes (Signed)
   Jun 23, 2020 0920  Clinical Encounter Type  Visited With Patient and family together  Visit Type Initial;Spiritual support;Social support;Critical Care;Patient actively dying;Death  Referral From Nurse  Consult/Referral To Chaplain  Stress Factors  Family Stress Factors Health changes;Loss;Loss of control   Chaplain responded to end-of-life page. Chaplain engaged with Pt's wife, and 2 daughters who were at bedside. Chaplain gave space for storytelling, and for the family to express their emotions. Chaplain normalized and supported them with their emotions. The family said they were grateful that the Chaplain came. Chaplain ministered with presence, reflective listening, and prayer as requested.

## 2020-06-24 NOTE — Progress Notes (Signed)
Mr. Delrossi was extubated to RA at [phone].  Wife, daughters and RN at bedside.

## 2020-06-24 DEATH — deceased

## 2020-06-29 LAB — BLOOD GAS, ARTERIAL
Acid-Base Excess: 0.7 mmol/L (ref 0.0–2.0)
Acid-Base Excess: 6.9 mmol/L — ABNORMAL HIGH (ref 0.0–2.0)
Acid-base deficit: 0.2 mmol/L (ref 0.0–2.0)
Bicarbonate: 28.7 mmol/L — ABNORMAL HIGH (ref 20.0–28.0)
Bicarbonate: 27.3 mmol/L (ref 20.0–28.0)
Bicarbonate: 34.3 mmol/L — ABNORMAL HIGH (ref 20.0–28.0)
FIO2: 1
FIO2: 1
FIO2: 0.8
MECHVT: 420 mL
MECHVT: 420 mL
O2 Saturation: 94.9 %
O2 Saturation: 99.4 %
O2 Saturation: 96.1 %
PEEP: 10 cmH2O
PEEP: 10 cmH2O
Patient temperature: 37
Patient temperature: 37
Patient temperature: 37
RATE: 18 resp/min
RATE: 22 resp/min
RATE: 22 resp/min
pCO2 arterial: 64 mmHg — ABNORMAL HIGH (ref 32.0–48.0)
pCO2 arterial: 58 mmHg — ABNORMAL HIGH (ref 32.0–48.0)
pCO2 arterial: 65 mmHg — ABNORMAL HIGH (ref 32.0–48.0)
pH, Arterial: 7.26 — ABNORMAL LOW (ref 7.350–7.450)
pH, Arterial: 7.28 — ABNORMAL LOW (ref 7.350–7.450)
pH, Arterial: 7.33 — ABNORMAL LOW (ref 7.350–7.450)
pO2, Arterial: 86 mmHg (ref 83.0–108.0)
pO2, Arterial: 175 mmHg — ABNORMAL HIGH (ref 83.0–108.0)
pO2, Arterial: 88 mmHg (ref 83.0–108.0)

## 2020-09-15 ENCOUNTER — Ambulatory Visit: Payer: Medicare Other | Admitting: Dermatology

## 2020-10-15 ENCOUNTER — Ambulatory Visit: Payer: Self-pay | Admitting: Urology

## 2022-05-19 IMAGING — MR MR HEAD W/O CM
11 series · 47 of 48 positions shown · non-contrast
Comparison: None.

CLINICAL DATA: Anoxic brain injury.

EXAM:
MRI HEAD WITHOUT CONTRAST
TECHNIQUE: Multiplanar, multiecho pulse sequences of the brain and surrounding
structures were obtained without intravenous contrast.

[Series 5: ax dwi_tracew · axial · 3.0mm · 0.65mm/px · z∈[-74,+81]mm · 4 of 48 slices shown]
[im 1/48]
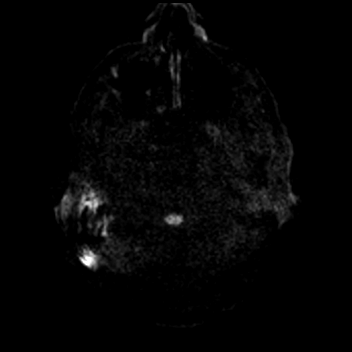
[im 16/48]
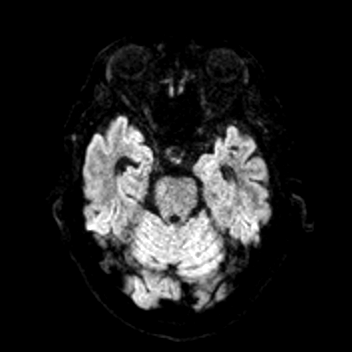
[im 32/48]
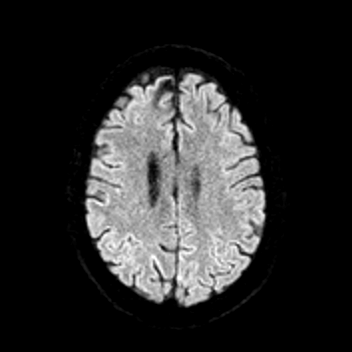
[im 48/48]
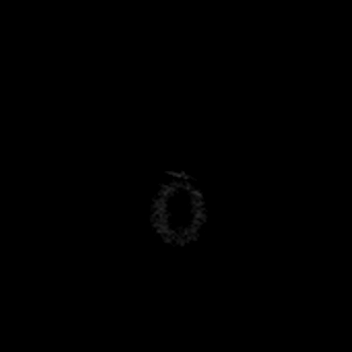

[Series 6: ax dwi_adc · axial · 3.0mm · 0.65mm/px · z∈[-74,+81]mm · 4 of 48 slices shown]
[im 1/48]
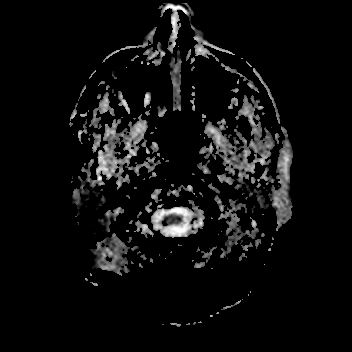
[im 16/48]
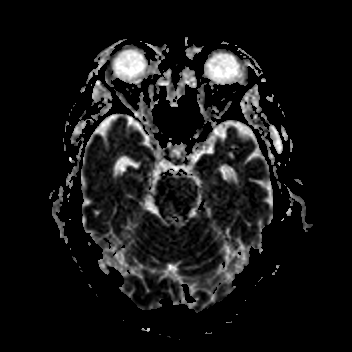
[im 32/48]
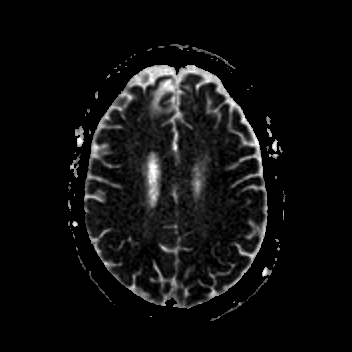
[im 48/48]
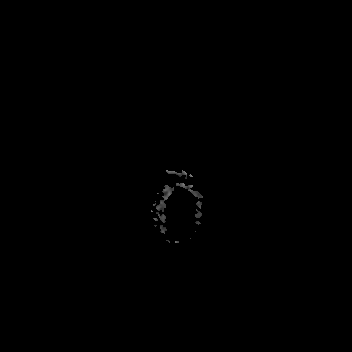

[Series 7: cor dwi_tracew · coronal · 5.0mm · 0.65mm/px · 3 of 40 slices shown]
[im 1/40]
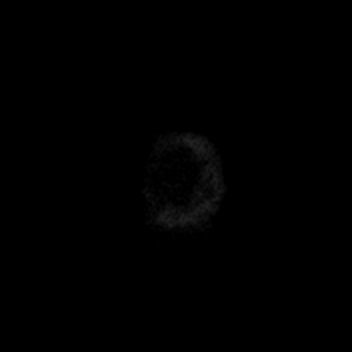
[im 20/40]
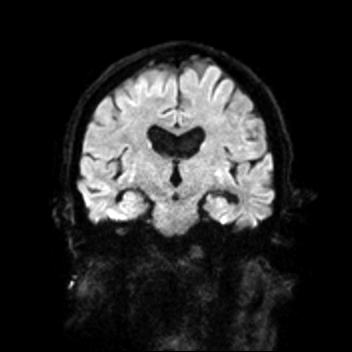
[im 40/40]
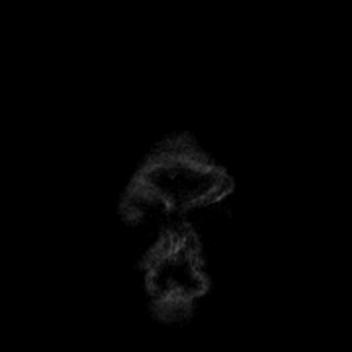

[Series 8: cor dwi_adc · coronal · 5.0mm · 0.65mm/px · 3 of 39 slices shown]
[im 1/39]
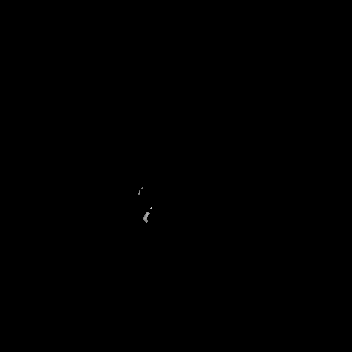
[im 20/39]
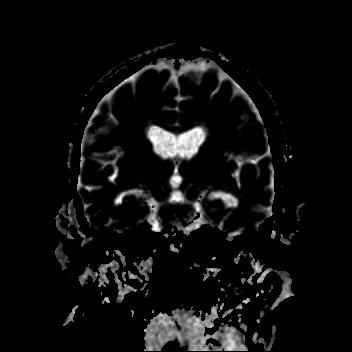
[im 39/39]
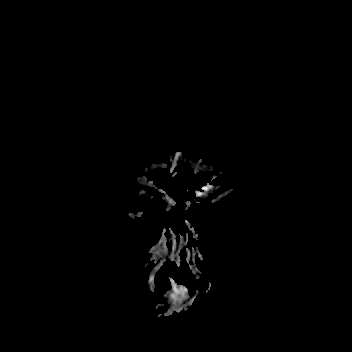

[Series 9: T1 · sagittal · 5.0mm · 0.62mm/px · 2 of 25 slices shown (1 of 2)]
[im 1/25]
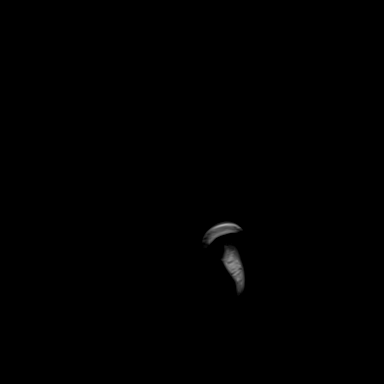
[im 25/25]
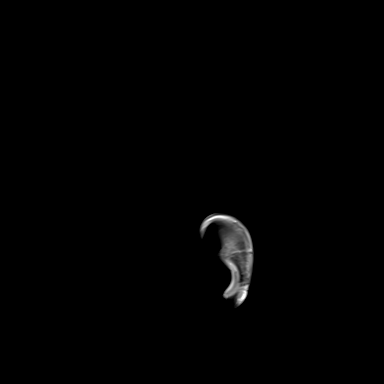

[Series 10: T2 · axial · 5.0mm · 0.53mm/px · z∈[-75,+81]mm · 2 of 27 slices shown (1 of 2)]
[im 1/27]
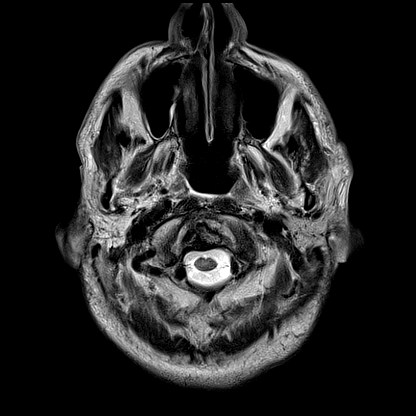
[im 27/27]
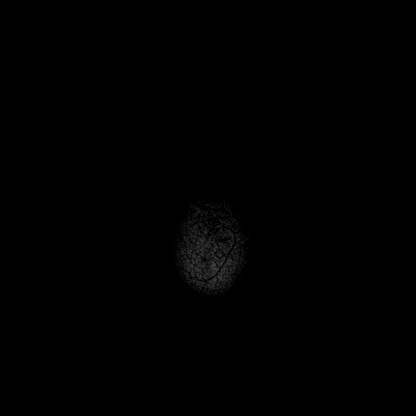

[Series 12: pha_images · axial · 3.0mm · 0.90mm/px · z∈[-84,+92]mm · 5 of 60 slices shown]
[im 1/60]
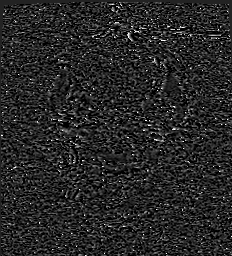
[im 15/60]
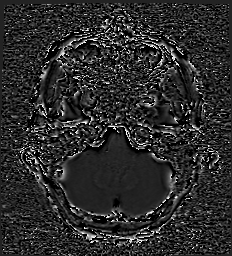
[im 30/60]
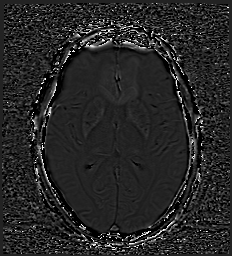
[im 45/60]
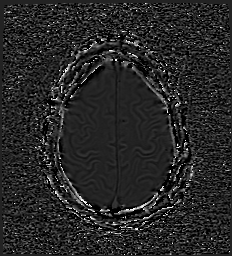
[im 60/60]
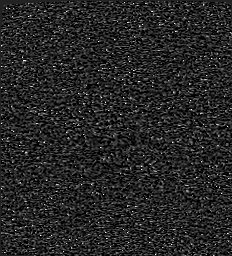

[Series 13: swi_images · axial · 3.0mm · 0.90mm/px · z∈[-84,+92]mm · 5 of 60 slices shown]
[im 1/60]
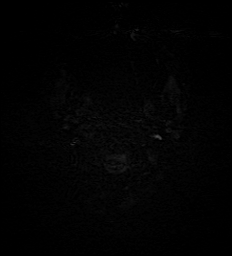
[im 15/60]
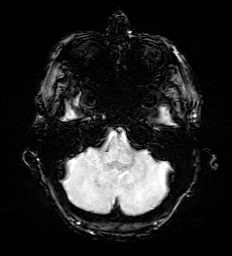
[im 30/60]
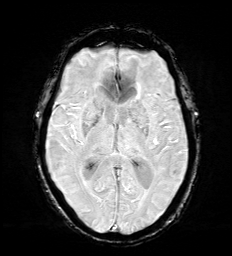
[im 45/60]
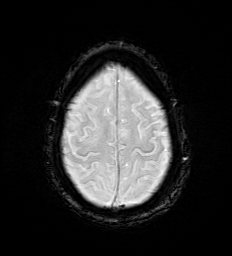
[im 60/60]
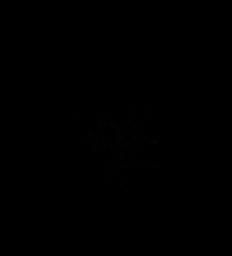

[Series 15: FLAIR · axial · 3.0mm · 0.53mm/px · z∈[-78,+84]mm · 4 of 55 slices shown]
[im 1/55]
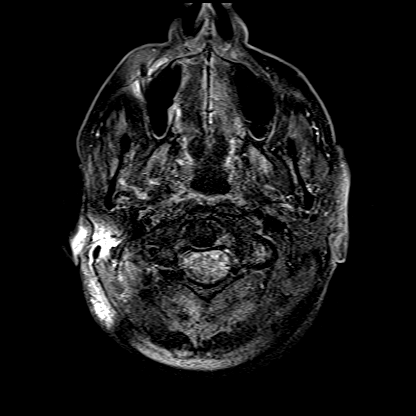
[im 19/55]
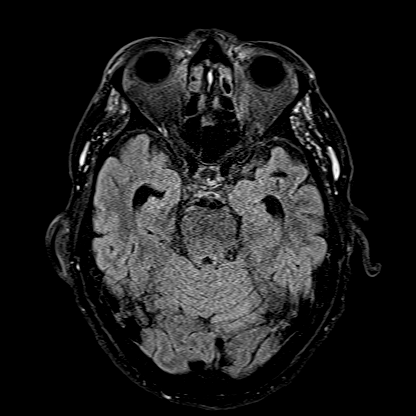
[im 37/55]
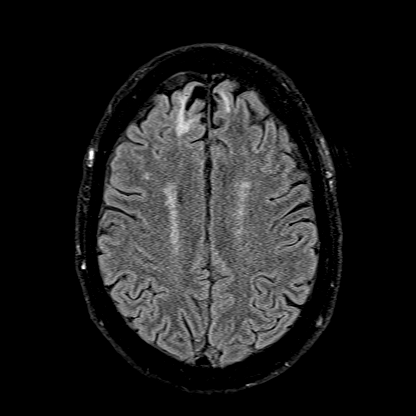
[im 55/55]
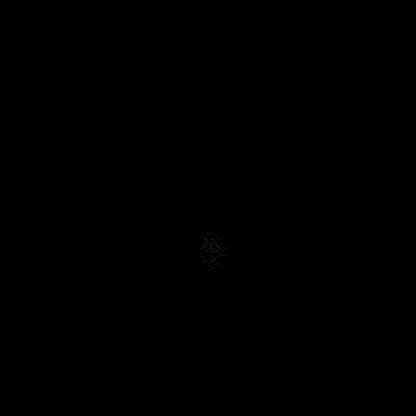

[Series 16: T1 · axial · 1.0mm · 0.98mm/px · z∈[-83,+91]mm · 13 of 176 slices shown (2 of 2)]
[im 1/176]
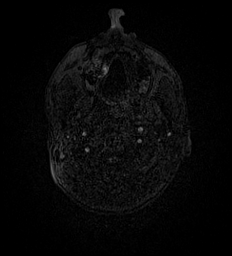
[im 14/176]
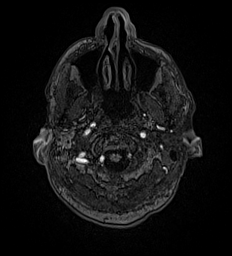
[im 27/176]
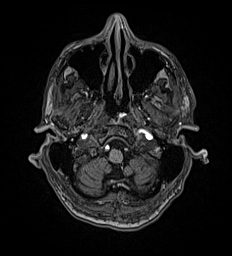
[im 41/176]
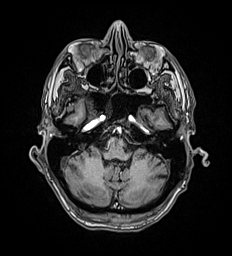
[im 54/176]
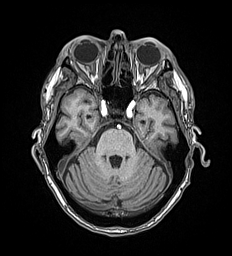
[im 68/176]
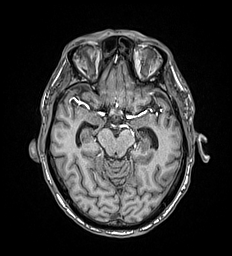
[im 81/176]
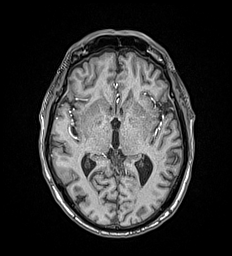
[im 95/176]
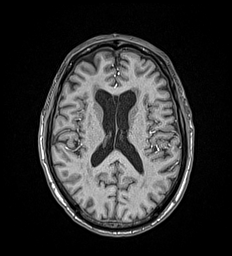
[im 108/176]
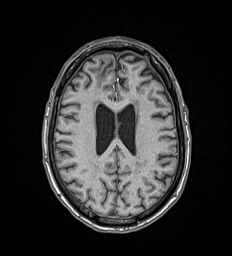
[im 122/176]
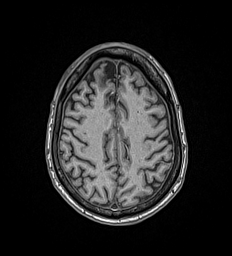
[im 135/176]
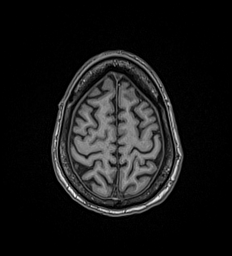
[im 149/176]
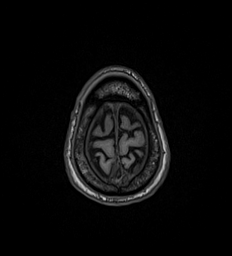
[im 176/176]
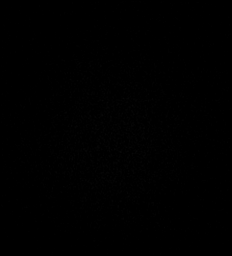

[Series 17: T2 · coronal · 5.0mm · 0.57mm/px · 2 of 29 slices shown (2 of 2)]
[im 1/29]
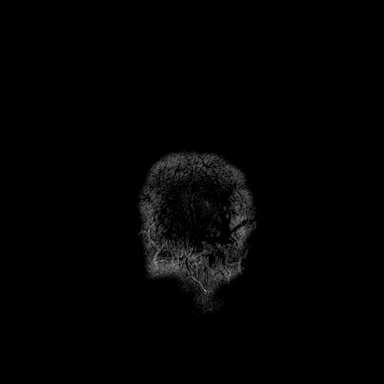
[im 29/29]
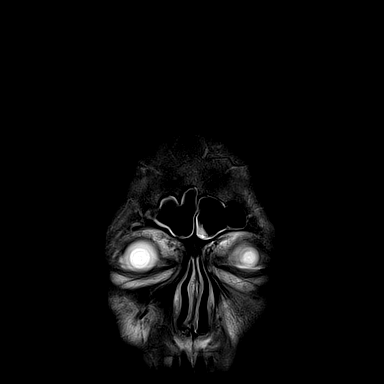

[47 of 48 positions shown; findings below may reference images not displayed]

FINDINGS: Brain: There is abnormal diffusion restriction within the medial
left temporal lobe ([DATE]). No acute or chronic hemorrhage. There is
encephalomalacia of both frontal lobes, right greater than left.
There is multifocal periventricular white matter hyperintensity,
most often a result of chronic microvascular ischemia. The midline
structures are normal.

Vascular: Major flow voids are preserved.

Skull and upper cervical spine: Normal calvarium and skull base.
Visualized upper cervical spine and soft tissues are normal.

Sinuses/Orbits:No paranasal sinus fluid levels or advanced mucosal
thickening. No mastoid or middle ear effusion. Normal orbits.
IMPRESSION: 1. Small area of abnormal diffusion restriction in the medial left
temporal lobe. This could indicate a focus of acute ischemia,
postictal phenomenon or be secondary to anoxic brain injury.
2. Bilateral frontal encephalomalacia, right greater than left,
likely a sequela of remote trauma.
